# Patient Record
Sex: Female | Born: 1960 | Race: White | Hispanic: No | Marital: Married | Smoking: Former smoker
Health system: Southern US, Community
[De-identification: ages and names within clinical notes are randomized; demographics above are authoritative.]

## PROBLEM LIST (undated history)

## (undated) DIAGNOSIS — M79606 Pain in leg, unspecified: Secondary | ICD-10-CM

## (undated) DIAGNOSIS — M79646 Pain in unspecified finger(s): Secondary | ICD-10-CM

## (undated) DIAGNOSIS — F32A Depression, unspecified: Secondary | ICD-10-CM

## (undated) DIAGNOSIS — E781 Pure hyperglyceridemia: Secondary | ICD-10-CM

## (undated) DIAGNOSIS — M79643 Pain in unspecified hand: Secondary | ICD-10-CM

## (undated) DIAGNOSIS — F419 Anxiety disorder, unspecified: Secondary | ICD-10-CM

## (undated) DIAGNOSIS — E119 Type 2 diabetes mellitus without complications: Secondary | ICD-10-CM

## (undated) DIAGNOSIS — I10 Essential (primary) hypertension: Secondary | ICD-10-CM

## (undated) DIAGNOSIS — M1990 Unspecified osteoarthritis, unspecified site: Secondary | ICD-10-CM

## (undated) DIAGNOSIS — M549 Dorsalgia, unspecified: Secondary | ICD-10-CM

## (undated) DIAGNOSIS — J45909 Unspecified asthma, uncomplicated: Secondary | ICD-10-CM

## (undated) HISTORY — PX: NOVASURE ABLATION: SHX5394

## (undated) HISTORY — DX: Unspecified asthma, uncomplicated: J45.909

## (undated) HISTORY — DX: Type 2 diabetes mellitus without complications: E11.9

## (undated) HISTORY — DX: Dorsalgia, unspecified: M54.9

## (undated) HISTORY — DX: Pain in unspecified hand: M79.643

## (undated) HISTORY — PX: TONSILLECTOMY: SUR1361

## (undated) HISTORY — PX: TUBAL LIGATION: SHX77

## (undated) HISTORY — DX: Pure hyperglyceridemia: E78.1

## (undated) HISTORY — DX: Pain in leg, unspecified: M79.606

## (undated) HISTORY — DX: Pain in unspecified finger(s): M79.646

## (undated) HISTORY — DX: Unspecified osteoarthritis, unspecified site: M19.90

## (undated) HISTORY — DX: Anxiety disorder, unspecified: F41.9

## (undated) HISTORY — DX: Depression, unspecified: F32.A

---

## 1986-03-09 HISTORY — PX: OTHER SURGICAL HISTORY: SHX169

## 1999-12-23 ENCOUNTER — Encounter: Payer: Self-pay | Admitting: *Deleted

## 1999-12-23 ENCOUNTER — Encounter: Admission: RE | Admit: 1999-12-23 | Discharge: 1999-12-23 | Payer: Self-pay | Admitting: *Deleted

## 2000-07-26 ENCOUNTER — Ambulatory Visit (HOSPITAL_COMMUNITY): Admission: RE | Admit: 2000-07-26 | Discharge: 2000-07-26 | Payer: Self-pay | Admitting: *Deleted

## 2000-07-26 ENCOUNTER — Encounter: Payer: Self-pay | Admitting: *Deleted

## 2000-11-22 ENCOUNTER — Encounter: Payer: Self-pay | Admitting: *Deleted

## 2000-11-22 ENCOUNTER — Ambulatory Visit (HOSPITAL_COMMUNITY): Admission: RE | Admit: 2000-11-22 | Discharge: 2000-11-22 | Payer: Self-pay | Admitting: *Deleted

## 2001-07-27 ENCOUNTER — Other Ambulatory Visit: Admission: RE | Admit: 2001-07-27 | Discharge: 2001-07-27 | Payer: Self-pay | Admitting: Obstetrics and Gynecology

## 2001-08-03 ENCOUNTER — Encounter: Payer: Self-pay | Admitting: Obstetrics and Gynecology

## 2001-08-03 ENCOUNTER — Ambulatory Visit (HOSPITAL_COMMUNITY): Admission: RE | Admit: 2001-08-03 | Discharge: 2001-08-03 | Payer: Self-pay | Admitting: Obstetrics and Gynecology

## 2001-11-09 ENCOUNTER — Emergency Department (HOSPITAL_COMMUNITY): Admission: EM | Admit: 2001-11-09 | Discharge: 2001-11-09 | Payer: Self-pay | Admitting: Emergency Medicine

## 2001-11-09 ENCOUNTER — Encounter: Payer: Self-pay | Admitting: Emergency Medicine

## 2001-11-14 ENCOUNTER — Encounter: Payer: Self-pay | Admitting: Internal Medicine

## 2001-11-14 ENCOUNTER — Ambulatory Visit (HOSPITAL_COMMUNITY): Admission: RE | Admit: 2001-11-14 | Discharge: 2001-11-14 | Payer: Self-pay | Admitting: Internal Medicine

## 2001-11-21 ENCOUNTER — Ambulatory Visit (HOSPITAL_COMMUNITY): Admission: RE | Admit: 2001-11-21 | Discharge: 2001-11-21 | Payer: Self-pay | Admitting: Internal Medicine

## 2001-11-21 ENCOUNTER — Encounter: Payer: Self-pay | Admitting: Internal Medicine

## 2002-09-13 ENCOUNTER — Ambulatory Visit (HOSPITAL_COMMUNITY): Admission: RE | Admit: 2002-09-13 | Discharge: 2002-09-13 | Payer: Self-pay | Admitting: Internal Medicine

## 2002-09-13 ENCOUNTER — Encounter: Payer: Self-pay | Admitting: Internal Medicine

## 2003-09-17 ENCOUNTER — Ambulatory Visit (HOSPITAL_COMMUNITY): Admission: RE | Admit: 2003-09-17 | Discharge: 2003-09-17 | Payer: Self-pay | Admitting: Internal Medicine

## 2004-11-24 ENCOUNTER — Ambulatory Visit (HOSPITAL_COMMUNITY): Admission: RE | Admit: 2004-11-24 | Discharge: 2004-11-24 | Payer: Self-pay | Admitting: Internal Medicine

## 2005-03-09 HISTORY — PX: ABLATION: SHX5711

## 2005-05-13 ENCOUNTER — Encounter: Payer: Self-pay | Admitting: Obstetrics & Gynecology

## 2005-05-13 ENCOUNTER — Ambulatory Visit (HOSPITAL_COMMUNITY): Admission: RE | Admit: 2005-05-13 | Discharge: 2005-05-13 | Payer: Self-pay | Admitting: Obstetrics & Gynecology

## 2005-12-21 ENCOUNTER — Ambulatory Visit (HOSPITAL_COMMUNITY): Admission: RE | Admit: 2005-12-21 | Discharge: 2005-12-21 | Payer: Self-pay | Admitting: Obstetrics & Gynecology

## 2009-01-28 ENCOUNTER — Ambulatory Visit (HOSPITAL_COMMUNITY): Admission: RE | Admit: 2009-01-28 | Discharge: 2009-01-28 | Payer: Self-pay | Admitting: Internal Medicine

## 2010-07-25 NOTE — Op Note (Signed)
NAMESTARLING, Bridget Johnston                ACCOUNT NO.:  [id number]   MEDICAL RECORD NO.:  [id number]          PATIENT TYPE:  AMB   LOCATION:  DAY                           FACILITY:  [facility]   PHYSICIAN:  Lazaro Arms, M.D.   DATE OF BIRTH:  Apr 12, 1960   DATE OF PROCEDURE:  05/13/2005  DATE OF DISCHARGE:                                 OPERATIVE REPORT   PREOPERATIVE DIAGNOSES:  1.  Menometrorrhagia.  2.  Dysmenorrhea.   POSTOPERATIVE DIAGNOSES:  1.  Menometrorrhagia.  2.  Dysmenorrhea.   PROCEDURE:  Hysteroscopy D&C, endometrial ablation.   SURGEON:  Dr. Despina Hidden.   ANESTHESIA:  Laryngeal mask airway.   FINDINGS:  The patient had a normal endometrial cavity. No polyps. No  fibroid. No abnormalities.   DESCRIPTION OF OPERATION:  The patient was taken to the operating room,  placed in supine position, underwent laryngeal mask airway, placed in dorsal  lithotomy position, prepped and draped in usual sterile fashion. Sterile  speculum was placed. Cervix was grasped, dilated serially to allow passage  of the hysteroscope. Saline was used as a descending medium. The endometrial  cavity was completely normal. Tubal ostia were seen. There was no polyps.  Endometrial tissue looked normal, and there was no submucosal myomas. The  endometrium was then vigorously curetted. Good uterine cry obtained in all  areas. A ThermaChoice III endometrial ablation balloon was then used,  required 36 cc of D5W to maintain a pressure of approximately 195 mmHg  throughout the case. It was heated to 87 degrees Celsius for a total therapy  time of 10 minutes and 51 seconds. All the fluid was returned at the end of  the procedure. The patient was awakened from anesthesia and taken to  recovery room in good and stable condition. All counts were correct. She  received Ancef and Toradol preoperatively.      Lazaro Arms, M.D.  Electronically Signed     LHE/MEDQ  D:  05/13/2005  T:  05/14/2005  Job:  [id number]

## 2010-08-25 ENCOUNTER — Encounter: Payer: Self-pay | Admitting: Family Medicine

## 2010-08-25 ENCOUNTER — Inpatient Hospital Stay (INDEPENDENT_AMBULATORY_CARE_PROVIDER_SITE_OTHER)
Admission: RE | Admit: 2010-08-25 | Discharge: 2010-08-25 | Disposition: A | Discharge status: Home / Self Care | Payer: Managed Care, Other (non HMO) | Source: Ambulatory Visit | Attending: Family Medicine | Admitting: Family Medicine

## 2010-08-25 DIAGNOSIS — N390 Urinary tract infection, site not specified: Secondary | ICD-10-CM

## 2010-08-25 DIAGNOSIS — IMO0001 Reserved for inherently not codable concepts without codable children: Secondary | ICD-10-CM

## 2010-08-25 DIAGNOSIS — R29818 Other symptoms and signs involving the nervous system: Secondary | ICD-10-CM

## 2010-08-25 DIAGNOSIS — R509 Fever, unspecified: Secondary | ICD-10-CM

## 2010-08-25 LAB — CONVERTED CEMR LAB
Nitrite: NEGATIVE
Urobilinogen, UA: 0.2

## 2010-08-27 ENCOUNTER — Telehealth (INDEPENDENT_AMBULATORY_CARE_PROVIDER_SITE_OTHER): Payer: Self-pay | Admitting: *Deleted

## 2010-08-29 ENCOUNTER — Telehealth (INDEPENDENT_AMBULATORY_CARE_PROVIDER_SITE_OTHER): Payer: Self-pay | Admitting: *Deleted

## 2010-10-08 ENCOUNTER — Encounter: Payer: Self-pay | Admitting: Emergency Medicine

## 2010-10-08 ENCOUNTER — Inpatient Hospital Stay (INDEPENDENT_AMBULATORY_CARE_PROVIDER_SITE_OTHER)
Admission: RE | Admit: 2010-10-08 | Discharge: 2010-10-08 | Disposition: A | Discharge status: Home / Self Care | Payer: Managed Care, Other (non HMO) | Source: Ambulatory Visit | Attending: Emergency Medicine | Admitting: Emergency Medicine

## 2010-10-08 DIAGNOSIS — N10 Acute tubulo-interstitial nephritis: Secondary | ICD-10-CM

## 2010-10-08 DIAGNOSIS — Z87448 Personal history of other diseases of urinary system: Secondary | ICD-10-CM | POA: Insufficient documentation

## 2010-10-08 DIAGNOSIS — N390 Urinary tract infection, site not specified: Secondary | ICD-10-CM | POA: Insufficient documentation

## 2010-10-08 LAB — CONVERTED CEMR LAB
Bilirubin Urine: NEGATIVE
Glucose, Urine, Semiquant: NEGATIVE
Nitrite: NEGATIVE
Specific Gravity, Urine: >=1.03
Urobilinogen, UA: 0.2
pH: 5.5

## 2010-10-11 ENCOUNTER — Telehealth (INDEPENDENT_AMBULATORY_CARE_PROVIDER_SITE_OTHER): Payer: Self-pay | Admitting: Emergency Medicine

## 2011-02-09 NOTE — Telephone Encounter (Signed)
  Phone Note Outgoing Call   Call placed by: Lavell Islam RN,  October 11, 2010 10:08 AM Call placed to: Patient Action Taken: Phone Call Completed Summary of Call: Spoke with patient and gave her C&S results on urine spec; told her to complete the course of ABX prescribed. Initial call taken by: Lavell Islam RN,  October 11, 2010 10:08 AM

## 2011-02-09 NOTE — Progress Notes (Signed)
Summary: POSS UTI/TJ   Vital Signs:  Patient Profile:   50 Years Old Female CC:      possible UTI x 1 day Height:     63 inches Weight:      221.50 pounds O2 Sat:      95 % O2 treatment:    Room Air Temp:     98.3 degrees F oral Pulse rate:   112 / minute Resp:     18 per minute BP sitting:   113 / 69  (left arm) Cuff size:   regular  Vitals Entered By: Clemens Catholic LPN (October 08, 2010 8:27 AM)                  Updated Prior Medication List: LYRICA 100 MG CAPS (PREGABALIN)  CELEBREX 200 MG CAPS (CELECOXIB)  LOVAZA 1 GM CAPS (OMEGA-3-ACID ETHYL ESTERS)  CRESTOR 10 MG TABS (ROSUVASTATIN CALCIUM)  VIIBRYD 40 MG TABS (VILAZODONE HCL)  MOBIC 7.5 MG TABS (MELOXICAM) 1 by mouth q day  Current Allergies (reviewed today): No known allergies History of Present Illness History from: patient Chief Complaint: possible UTI x 1 day History of Present Illness: 50 yo F. Pt complains of 1 days of dysuria, frequency. + fever, max 103 last night with chills.  No hematuria. No abdominal pain. + L flank pain and L back pain. No nausea. No vomiting. Denies chance of pregnancy. LMP 2008. Had endometrial ablation 2008 for endometriosis 2008. Also c/o myalgias, mild headache.  Was seen here for UTI 08/26/10 for UTI. Documented E.Coli by urine cx, pansensisitive, effectively tx'd at that time by the prior treating MD. She denies hx of uti's prior to 08/2010.  REVIEW OF SYSTEMS Constitutional Symptoms       Complains of fever and chills.     Denies night sweats, weight loss, weight gain, and fatigue.  Eyes       Denies change in vision, eye pain, eye discharge, glasses, contact lenses, and eye surgery. Ear/Nose/Throat/Mouth       Denies hearing loss/aids, change in hearing, ear pain, ear discharge, dizziness, frequent runny nose, frequent nose bleeds, sinus problems, sore throat, hoarseness, and tooth pain or bleeding.  Respiratory       Denies dry cough, productive cough, wheezing,  shortness of breath, asthma, bronchitis, and emphysema/COPD.  Cardiovascular       Denies murmurs, chest pain, and tires easily with exhertion.    Gastrointestinal       Denies stomach pain, nausea/vomiting, diarrhea, constipation, blood in bowel movements, and indigestion. Genitourniary       Complains of painful urination.      Denies kidney stones and loss of urinary control. Neurological       Complains of headaches, numbness, and tingling.      Denies paralysis, seizures, and fainting/blackouts. Musculoskeletal       Complains of joint stiffness.      Denies muscle pain, joint pain, decreased range of motion, redness, swelling, muscle weakness, and gout.  Skin       Denies bruising, unusual mles/lumps or sores, and hair/skin or nail changes.  Psych       Denies mood changes, temper/anger issues, anxiety/stress, speech problems, depression, and sleep problems. Other Comments: pt c/o fever, joint pain, fever, and mid back pain x today. she has taken tylenol.   Past History:  Past Medical History: Reviewed history from 08/25/2010 and no changes required. Hyperlipidemia  Past Surgical History: Reviewed history from 08/25/2010 and no changes required.  Tonsillectomy ablasion vaginal deliveries  Family History: Reviewed history from 08/25/2010 and no changes required. mother- heart dz father- diabetes  Social History: Reviewed history from 08/25/2010 and no changes required. Never Smoked Alcohol use-no Drug use-no Physical Exam General appearance: well developed, well nourished,fatigued,  no acute distress Head: normocephalic, atraumatic Eyes: conjunctivae and lids normal Ears: normal, no lesions or deformities Nasal: mucosa pink, nonedematous, no septal deviation, turbinates normal Oral/Pharynx: tongue normal, posterior pharynx without erythema or exudate Neck: neck supple,  trachea midline, no masses Chest/Lungs: no rales, wheezes, or rhonchi bilateral, breath sounds  equal without effort Heart: regular rate and  rhythm, no murmur Abdomen: soft,  without obvious organomegaly. + suprapubic ttp. +L flank and L CVA ttp. No rebound tenderness. Extremities: normal extremities Neurological: grossly intact and non-focal Back: no tenderness over musculature, straight leg raises negative bilaterally, deep tendon reflexes 2+ at achilles and patella Skin: no obvious rashes or lesions. skin moist. 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 Clinically, has  PYELONEPHRITIS. Will rx aggressively as outpt.  Patient Education: Patient and/or caregiver instructed in the following: rest, fluids.  Plan New Medications/Changes: FLUCONAZOLE 200 MG TABS (FLUCONAZOLE) 1 by mouth stat as needed for yeast infection. May repeat X 1 three days later.  #2 x 0, 10/08/2010, Lajean Manes MD CIPRO 500 MG TABS (CIPROFLOXACIN HCL) 1po two times a day X 10 days  #20 x 0, 10/08/2010, Lajean Manes MD  New Orders: Est. Patient Level IV [40981] Rocephin 250mg  [CPT-J0696] T-Culture, Urine [[id number]] UA Dipstick w/Micro (automated) [81001] Admin of Therapeutic Inj  intramuscular or subcutaneous [96372] Planning Comments:   Rocephin 1 gram IM stat, then Cipro by mouth for 10 days. Urine cx. F/U pcp 5-7 days, sooner if worse or new sxs. I advised f/u with urologist, but she declined. Risks, benefits, alternatives discussed. Pt voiced understanding.   The patient and/or caregiver has been counseled thoroughly with regard to medications prescribed including dosage, schedule, interactions, rationale for use, and possible side effects and they verbalize understanding.  Diagnoses and expected course of recovery discussed and will return if not improved as expected or if the condition worsens. Patient and/or caregiver verbalized understanding.  Prescriptions: FLUCONAZOLE 200 MG TABS (FLUCONAZOLE) 1 by mouth stat as needed for  yeast infection. May repeat X 1 three days later.  #2 x 0   Entered and Authorized by:   Lajean Manes MD   Signed by:   Lajean Manes MD on 10/08/2010   Method used:   Print then Give to Patient   RxID:   [id number] CIPRO 500 MG TABS (CIPROFLOXACIN HCL) 1po two times a day X 10 days  #20 x 0   Entered and Authorized by:   Lajean Manes MD   Signed by:   Lajean Manes MD on 10/08/2010   Method used:   Print then Give to Patient   RxID:   [id number]   Medication Administration  Injection # 1:    Medication: Rocephin  250mg     Diagnosis: ACUT PYELONEPHRITIS W/O LES RENAL MEDULRY NECROS (ICD-590.10)    Route: IM    Site: RUOQ gluteus    Exp Date: 05/07/2013    Lot #: [id number]    Mfr: [email or web address]    Comments: 1 gram given    Patient tolerated injection without complications    Given by: Clemens Catholic LPN (October 08, 2010 9:23 AM)  Orders Added: 1)  Est. Patient Level IV [47425] 2)  Rocephin 250mg  [CPT-J0696] 3)  T-Culture, Urine [16109-60454] 4)  UA Dipstick w/Micro (automated) [81001] 5)  Admin of Therapeutic Inj  intramuscular or subcutaneous [96372]    Laboratory Results   Urine Tests  Date/Time Received: October 08, 2010 8:30 AM  Date/Time Reported: October 08, 2010 8:30 AM   Routine Urinalysis   Appearance: Cloudy Glucose: negative   (Normal Range: Negative) Bilirubin: negative   (Normal Range: Negative) Ketone: trace (5)   (Normal Range: Negative) Spec. Gravity: >=1.030   (Normal Range: 1.003-1.035) Blood: 1+   (Normal Range: Negative) pH: 5.5   (Normal Range: 5.0-8.0) Protein: 2+   (Normal Range: Negative) Urobilinogen: 0.2   (Normal Range: 0-1) Nitrite: negative   (Normal Range: Negative) Leukocyte Esterace: 1+   (Normal Range: Negative)

## 2011-02-09 NOTE — Telephone Encounter (Signed)
  Phone Note Call from Patient   Caller: Patient Call For: Labs Reason for Call: Talk to Nurse Summary of Call: Patient called requesting lab results. After contacting [facility] I received her Lymes and BMP results. Results given over the phone. Patient is still having the same fevers. Told her we would call once all lab results are in. Keep using tylenol or ibuprofen for fever, stay hydrated. Call with concerns. Initial call taken by: Lajean Saver RN,  August 27, 2010 3:10 PM

## 2011-02-09 NOTE — Telephone Encounter (Signed)
  Phone Note Outgoing Call   Call placed by: Clemens Catholic LPN,  August 29, 2010 6:34 PM Call placed to: Patient Summary of Call: call back: spoke to pt and given lab results. she states that she is still weak and she has developed more fever blisters. she does not have a fever anymore. advised her to complete abt and if no improve by Monday to see her PCP. pt agrees. to call back if she has any questions or concerns. Initial call taken by: Clemens Catholic LPN,  August 29, 2010 6:34 PM

## 2011-02-09 NOTE — Progress Notes (Signed)
Summary: FEVER/CHILLS/JOINT PAIN/HEADACHE rm 2   Vital Signs:  Patient Profile:   50 Years Old Female CC:      fever, chills, jt pain, HA, and dizzy x Saturday Height:     63 inches Weight:      223 pounds O2 Sat:      94 % O2 treatment:    Room Air Temp:     99.2 degrees F oral Pulse rate:   103 / minute Resp:     20 per minute BP sitting:   106 / 71  (left arm) Cuff size:   large  Vitals Entered By: Clemens Catholic LPN (August 25, 2010 5:28 PM)                  Prior Medication List:  No prior medications documented  Updated Prior Medication List: LYRICA 100 MG CAPS (PREGABALIN)  CELEBREX 200 MG CAPS (CELECOXIB)  LOVAZA 1 GM CAPS (OMEGA-3-ACID ETHYL ESTERS)  CRESTOR 10 MG TABS (ROSUVASTATIN CALCIUM)  VIIBRYD 40 MG TABS (VILAZODONE HCL)   Current Allergies: No known allergies History of Present Illness Chief Complaint: fever, chills, jt pain, HA, and dizzy x Saturday History of Present Illness: Patient report episodic high fevers w/myalgia,aches andchills since Saturday early AM. She denies sore throat cough but did notice a strange hue and smell to her urine.  Current Problems: URINARY TRACT INFECTION (ICD-599.0) MUSCULOSKELETAL PAIN (ICD-781.99) MYALGIA/MYOSITIS (ICD-729.1) FEVER (ICD-780.60) HYPERLIPIDEMIA (ICD-272.4)   Current Meds LYRICA 100 MG CAPS (PREGABALIN)  CELEBREX 200 MG CAPS (CELECOXIB)  LOVAZA 1 GM CAPS (OMEGA-3-ACID ETHYL ESTERS)  CRESTOR 10 MG TABS (ROSUVASTATIN CALCIUM)  VIIBRYD 40 MG TABS (VILAZODONE HCL)  DOXYCYCLINE HYCLATE 100 MG CAPS (DOXYCYCLINE HYCLATE) Take 1 tab twice a day MOBIC 7.5 MG TABS (MELOXICAM) 1 by mouth q day  REVIEW OF SYSTEMS Constitutional Symptoms       Complains of fever, chills, night sweats, and fatigue.     Denies weight loss and weight gain.  Eyes       Complains of change in vision and glasses.      Denies eye pain, eye discharge, contact lenses, and eye surgery. Ear/Nose/Throat/Mouth       Denies hearing  loss/aids, change in hearing, ear pain, ear discharge, dizziness, frequent runny nose, frequent nose bleeds, sinus problems, sore throat, hoarseness, and tooth pain or bleeding.  Respiratory       Denies dry cough, productive cough, wheezing, shortness of breath, asthma, bronchitis, and emphysema/COPD.  Cardiovascular       Denies murmurs, chest pain, and tires easily with exhertion.    Gastrointestinal       Denies stomach pain, nausea/vomiting, diarrhea, constipation, blood in bowel movements, and indigestion.      Comments: nausea Genitourniary       Denies painful urination, kidney stones, and loss of urinary control. Neurological       Complains of headaches and tremors.      Denies paralysis, seizures, and fainting/blackouts. Musculoskeletal       Complains of muscle pain, joint pain, and joint stiffness.      Denies decreased range of motion, redness, swelling, muscle weakness, and gout.  Skin       Denies bruising, unusual mles/lumps or sores, and hair/skin or nail changes.  Psych       Denies mood changes, temper/anger issues, anxiety/stress, speech problems, depression, and sleep problems. Other Comments: pt c/o fever, joint pain, chills, HA, and dizzy x Saturday @ 4AM. she states that her temp  was 104.6 this morning. she has take Tylenol and Motrin with no relief.    Past History:  Family History: Last updated: 08/25/2010 mother- heart dz father- diabetes  Social History: Last updated: 08/25/2010 Never Smoked Alcohol use-no Drug use-no  Past Medical History: Hyperlipidemia  Past Surgical History: Tonsillectomy ablasion vaginal deliveries  Family History: Reviewed history and no changes required. mother- heart dz father- diabetes  Social History: Reviewed history and no changes required. Never Smoked Alcohol use-no Drug use-no Smoking Status:  never Drug Use:  no Physical Exam General appearance: well developed, well nourished,anxious Head:  normocephalic, atraumatic Oral/Pharynx: tongue normal, posterior pharynx without erythema or exudate some redness over upper hard palate Neck: supple,anterior lymphadenopathy present Chest/Lungs: no rales, wheezes, or rhonchi bilateral, breath sounds equal without effort Heart: regular rate and  rhythm, no murmur Abdomen: soft, non-tender without obvious organomegaly Extremities: normal extremities Skin: no obvious rashes or lesions MSE: oriented to time, place, and person Assessment New Problems: URINARY TRACT INFECTION (ICD-599.0) MUSCULOSKELETAL PAIN (ICD-781.99) MYALGIA/MYOSITIS (ICD-729.1) FEVER (ICD-780.60) HYPERLIPIDEMIA (ICD-272.4)  myalgia and fever abnormal urine Pylo?  Plan New Medications/Changes: MOBIC 7.5 MG TABS (MELOXICAM) 1 by mouth q day  #30 x 1, 08/25/2010, Hassan Rowan MD DOXYCYCLINE HYCLATE 100 MG CAPS (DOXYCYCLINE HYCLATE) Take 1 tab twice a day  #20 x 0, 08/25/2010, Hassan Rowan MD  New Orders: New Patient Level IV [99204] Rocephin 250mg  [CPT-J0696] UA Dipstick W/ Micro (manual) [81000] CBC w/Diff [[id number]] T-Basic Metabolic Panel [80048-22910] T-Culture, Urine [09811-91478] T-Lyme Disease [29562-13086] T-Rocky Mtn Spotted Fever AB IgG IgM [57846-96295] Rocephin  250mg  [J0696] Admin of Therapeutic Inj  intramuscular or subcutaneous [28413] Planning Comments:   due to nature opf illness recommmend waiting until Wednesday or Thursday before going back to work  Follow Up: Follow up in 2-3 days if no improvement, Follow up on an as needed basis, Follow up with Primary Physician Work/School Excuse: Return to work/school in 2 days  The patient and/or caregiver has been counseled thoroughly with regard to medications prescribed including dosage, schedule, interactions, rationale for use, and possible side effects and they verbalize understanding.  Diagnoses and expected course of recovery discussed and will return if not improved as expected or if the  condition worsens. Patient and/or caregiver verbalized understanding.  Prescriptions: MOBIC 7.5 MG TABS (MELOXICAM) 1 by mouth q day  #30 x 1   Entered and Authorized by:   Hassan Rowan MD   Signed by:   Hassan Rowan MD on 08/25/2010   Method used:   Print then Give to Patient   RxID:   [id number] DOXYCYCLINE HYCLATE 100 MG CAPS (DOXYCYCLINE HYCLATE) Take 1 tab twice a day  #20 x 0   Entered and Authorized by:   Hassan Rowan MD   Signed by:   Hassan Rowan MD on 08/25/2010   Method used:   Print then Give to Patient   RxID:   [id number]   Patient Instructions: 1)  This could be early pylonephritis 2)  or tick fever 3)  will get RMSF, and lymes test  4)  1gram of Rocephin given here culture of urine pending 5)  Doxycycline 100mg  by mouth twice aday 6)  mobic for fever 7)  follow up here or w/PCP in 1 week  8)  return to work at 06/20-21/2012 9)  Please schedule a follow-up appointment as needed. 10)  Please schedule an appointment with your primary doctor in :7-10 days 11)  Drink as much fluid as you can tolerate for the  next few days. 12)  Oral Rehydration Solution: drink 1/2 ounce every 15 minutes. If tolerated afert 1 hour, drink 1 ounce every 15 minutes. As you can tolerate, keep adding 1/2 ounce every 15 minutes, up to a total of 2-4 ounces. Contact the office if unable to tolerate oral solution, if you keep vomiting, or you continue to have signs of dehydration. 13)  Take your antibiotic as prescribed until ALL of it is gone, but stop if you develop a rash or swelling and contact our office as soon as possible.  Medication Administration  Injection # 1:    Medication: Rocephin  250mg     Diagnosis: URINARY TRACT INFECTION (ICD-599.0)    Route: IM    Site: RUOQ gluteus    Exp Date: 07/07/2012    Lot #: [id number]    Mfr: [email or web address]    Comments: 1g given    Patient tolerated injection without complications    Given by: Clemens Catholic LPN (August 25, 2010 7:03 PM)  Orders  Added: 1)  New Patient Level IV [99204] 2)  Rocephin 250mg  [CPT-J0696] 3)  UA Dipstick W/ Micro (manual) [81000] 4)  CBC w/Diff [91478-29562] 5)  T-Basic Metabolic Panel [80048-22910] 6)  T-Culture, Urine [13086-57846] 7)  T-Lyme Disease [96295-28413] 8)  T-Rocky Mtn Spotted Fever AB IgG IgM [24401-02725] 9)  Rocephin  250mg  [J0696] 10)  Admin of Therapeutic Inj  intramuscular or subcutaneous [96372]      Laboratory Results   Urine Tests  Date/Time Received: August 25, 2010 7:03 PM  Date/Time Reported: August 25, 2010 7:03 PM   Routine Urinalysis   Color: dark yellow Appearance: Cloudy Glucose: negative   (Normal Range: Negative) Bilirubin: negative   (Normal Range: Negative) Ketone: negative   (Normal Range: Negative) Spec. Gravity: 1.020   (Normal Range: 1.003-1.035) Blood: trace-intact   (Normal Range: Negative) pH: 6.0   (Normal Range: 5.0-8.0) Protein: 1+   (Normal Range: Negative) Urobilinogen: 0.2   (Normal Range: 0-1) Nitrite: negative   (Normal Range: Negative) Leukocyte Esterace: 1+   (Normal Range: Negative)

## 2011-04-28 ENCOUNTER — Telehealth: Payer: Self-pay | Admitting: *Deleted

## 2011-04-28 ENCOUNTER — Ambulatory Visit (INDEPENDENT_AMBULATORY_CARE_PROVIDER_SITE_OTHER): Payer: Managed Care, Other (non HMO) | Admitting: Orthopedic Surgery

## 2011-04-28 ENCOUNTER — Encounter: Payer: Self-pay | Admitting: Orthopedic Surgery

## 2011-04-28 VITALS — Ht 63.0 in | Wt 228.0 lb

## 2011-04-28 DIAGNOSIS — M6530 Trigger finger, unspecified finger: Secondary | ICD-10-CM

## 2011-04-28 NOTE — Patient Instructions (Addendum)
You may have some pain the first few days   If so take XS tylenol 500 mg 2 tabs ev 8 hrs and apply ice

## 2011-04-28 NOTE — Progress Notes (Signed)
Patient ID: Bridget Johnston, female   DOB: 1960/07/18, 51 y.o.   MRN: [id number] Chief Complaint  Patient presents with  . Hand Problem    trigger finger both ring fingers, started Aug2012    51 yo female with h/o bilateral triggering of the ring fingers for several months with pain, locking and swelling.   No previous treatment  Review of Systems  Musculoskeletal:       See hpi   All other systems reviewed and are negative.    Past Medical History  Diagnosis Date  . High triglycerides   . Back pain   . Leg pain   . Hand pain   . Finger pain    History reviewed. No pertinent past surgical history.  History   Social History  . Marital Status: Married    Spouse Name: N/A    Number of Children: N/A  . Years of Education: college   Occupational History  . Not on file.   Social History Main Topics  . Smoking status: Never Smoker   . Smokeless tobacco: Not on file  . Alcohol Use: No  . Drug Use: No  . Sexually Active: Not on file   Other Topics Concern  . Not on file   Social History Narrative  . No narrative on file    Physical Exam(12) GENERAL: normal development   CDV: pulses are normal   Skin: normal  Lymph: nodes were not palpable/normal  Psychiatric: awake, alert and oriented  Neuro: normal sensation  Right and left ring fingers  * the A1 pulley is tender, the finger locks and triggers, no swelling of the digit but there is swelling of each hand. No atrophy or contracture, flexion power is normal   DX: trigger fingers   PLAN: bilateral injections

## 2011-04-28 NOTE — Telephone Encounter (Signed)
Opened in error

## 2011-04-28 NOTE — Progress Notes (Signed)
Procedure note trigger finger injection right ring   Diagnosis trigger finger Postop diagnosis trigger finger Procedure injection of trigger finger Finger injected RIGHT long finger and LEFT long finger  Details of procedure: After verbal consent and timeout to confirm site the RIGHT long finger was injected with 1 cc of 40 mg of Depo-Medrol and 1 cc of 1% lidocaine this was then repeated on the LEFT finger  The procedure was tolerated well without complication  Repeated for left ring finger

## 2011-05-07 ENCOUNTER — Telehealth: Payer: Self-pay | Admitting: Orthopedic Surgery

## 2011-05-07 NOTE — Telephone Encounter (Signed)
Advised Bridget Johnston of your reply.  She said you had told her at last visit  She could get up to 3 injections, and this was only her first . She was hoping to get  a second injection before any surgery is scheduled

## 2011-05-07 NOTE — Telephone Encounter (Signed)
rec surgical release if not improved   Schedule for preop if needed

## 2011-05-07 NOTE — Telephone Encounter (Signed)
Bridget Johnston received injections in bilateral ring fingers 04/28/11, she says both of them are still locking and hurting, pain may be just a little improved.  Asking when can she get another injection. Can leave a message at [phone] if no answer

## 2011-05-08 NOTE — Telephone Encounter (Signed)
Yes but its too soon to re-inject especially if it did not work

## 2013-12-22 LAB — HM MAMMOGRAPHY: HM MAMMO: NORMAL

## 2013-12-22 LAB — HM PAP SMEAR: HM PAP: NORMAL

## 2014-01-03 ENCOUNTER — Encounter: Payer: Self-pay | Admitting: Obstetrics & Gynecology

## 2014-01-03 ENCOUNTER — Ambulatory Visit (INDEPENDENT_AMBULATORY_CARE_PROVIDER_SITE_OTHER): Payer: Managed Care, Other (non HMO)

## 2014-01-03 ENCOUNTER — Ambulatory Visit (INDEPENDENT_AMBULATORY_CARE_PROVIDER_SITE_OTHER): Payer: Managed Care, Other (non HMO) | Admitting: Obstetrics & Gynecology

## 2014-01-03 VITALS — BP 131/77 | HR 92 | Resp 16 | Ht 63.0 in | Wt 232.0 lb

## 2014-01-03 DIAGNOSIS — R928 Other abnormal and inconclusive findings on diagnostic imaging of breast: Secondary | ICD-10-CM

## 2014-01-03 DIAGNOSIS — F1121 Opioid dependence, in remission: Secondary | ICD-10-CM

## 2014-01-03 DIAGNOSIS — F1120 Opioid dependence, uncomplicated: Secondary | ICD-10-CM | POA: Insufficient documentation

## 2014-01-03 DIAGNOSIS — Z124 Encounter for screening for malignant neoplasm of cervix: Secondary | ICD-10-CM

## 2014-01-03 DIAGNOSIS — J45909 Unspecified asthma, uncomplicated: Secondary | ICD-10-CM | POA: Insufficient documentation

## 2014-01-03 DIAGNOSIS — Z01419 Encounter for gynecological examination (general) (routine) without abnormal findings: Secondary | ICD-10-CM

## 2014-01-03 DIAGNOSIS — J4520 Mild intermittent asthma, uncomplicated: Secondary | ICD-10-CM

## 2014-01-03 DIAGNOSIS — Z1151 Encounter for screening for human papillomavirus (HPV): Secondary | ICD-10-CM

## 2014-01-03 NOTE — Progress Notes (Signed)
  Subjective:    Bridget Johnston is a 53 y.o. female who presents for an annual exam. The patient has no complaints of mild to moderate hot flashes.  She does not wish to take HRT  She will let us know if they become unbearable. The patient is sexually active.   She is experiencing some decrease in libido.  She is aware of the new drug coming on market but she is not interested in taking it.  She is married almost 30 years and is happy in her marriage.  GYN screening history: normal--has been several years The patient wears seatbelts: yes. The patient participates in regular exercise: no. Has the patient ever been transfused or tattooed?: not asked. The patient reports that there is not domestic violence in her life.   Menstrual History: OB History   Grav Para Term Preterm Abortions TAB SAB Ect Mult Living   2 2 2       2       No LMP recorded. Patient has had an ablation.    The following portions of the patient's history were reviewed and updated as appropriate: allergies, current medications, past family history, past medical history, past social history, past surgical history and problem list.  Review of Systems Pertinent items are noted in HPI.    Objective:      Filed Vitals:   01/03/14 0823  BP: 131/77  Pulse: 92  Resp: 16  Height: 5\' 3"  (1.6 m)  Weight: 232 lb (105.235 kg)   Vitals:  WNL General appearance: alert, cooperative and no distress Head: Normocephalic, without obvious abnormality, atraumatic Eyes: negative Throat: lips, mucosa, and tongue normal; teeth and gums normal Lungs: clear to auscultation bilaterally Breasts: normal appearance, no masses or tenderness, No nipple retraction or dimpling, No nipple discharge or bleeding Heart: regular rate and rhythm Abdomen: soft, non-tender; bowel sounds normal; no masses,  no organomegaly  Pelvic:  External Genitalia:  Tanner V, no lesion (pt is concerned about 3 small dark spots on her fourchette--reassured these are  normal hyperpigmented areas.  No reason for biopsy.  If they change then pt is to notify us.   Urethra:  No prolapse Vagina:  Pale pink, normal rugae, no blood or discharge; Moderate cystocele Cervix:  No CMT, no lesion Uterus:  Normal size and contour, non tender; mild prolapse of uterine corpus Adnexa:  Normal, no masses, non tender Rectovaginal Septum:  Non tender, no masses  Extremities: no edema, redness or tenderness in the calves or thighs Skin: no lesions or rash Lymph nodes: Axillary adenopathy: none     .    Assessment:    Healthy female exam.    Plan:     Mammogram. Thin prep Pap smear. with co testing Calcium (pt already takes vitamin D) RTC 12-18 months

## 2014-01-05 ENCOUNTER — Other Ambulatory Visit: Payer: Self-pay | Admitting: Obstetrics & Gynecology

## 2014-01-05 DIAGNOSIS — R928 Other abnormal and inconclusive findings on diagnostic imaging of breast: Secondary | ICD-10-CM

## 2014-01-05 LAB — CYTOLOGY - PAP

## 2014-01-08 ENCOUNTER — Encounter: Payer: Self-pay | Admitting: Obstetrics & Gynecology

## 2014-01-18 ENCOUNTER — Other Ambulatory Visit: Payer: Self-pay

## 2014-01-18 ENCOUNTER — Other Ambulatory Visit: Payer: Self-pay | Admitting: Obstetrics & Gynecology

## 2014-01-18 DIAGNOSIS — R928 Other abnormal and inconclusive findings on diagnostic imaging of breast: Secondary | ICD-10-CM

## 2014-01-22 ENCOUNTER — Ambulatory Visit
Admission: RE | Admit: 2014-01-22 | Discharge: 2014-01-22 | Disposition: A | Discharge status: Home / Self Care | Payer: Managed Care, Other (non HMO) | Source: Ambulatory Visit | Attending: Obstetrics & Gynecology | Admitting: Obstetrics & Gynecology

## 2014-01-22 DIAGNOSIS — R928 Other abnormal and inconclusive findings on diagnostic imaging of breast: Secondary | ICD-10-CM

## 2014-03-12 LAB — BASIC METABOLIC PANEL
CREATININE: 0.7 mg/dL (ref 0.5–1.1)
Glucose: 111 mg/dL
Potassium: 4.7 mmol/L (ref 3.4–5.3)

## 2014-03-12 LAB — HEPATIC FUNCTION PANEL
ALT: 57 U/L — AB (ref 7–35)
AST: 42 U/L — AB (ref 13–35)

## 2014-03-12 LAB — LIPID PANEL
CHOLESTEROL: 258 mg/dL — AB (ref 0–200)
HDL: 37 mg/dL (ref 35–70)
Triglycerides: 463 mg/dL — AB (ref 40–160)

## 2014-03-12 LAB — CBC AND DIFFERENTIAL
Hemoglobin: 13.7 g/dL (ref 12.0–16.0)
PLATELETS: 293 10*3/uL (ref 150–399)
WBC: 6.4 10*3/mL

## 2014-03-12 LAB — VITAMIN D 25 HYDROXY (VIT D DEFICIENCY, FRACTURES): Vit D, 25-Hydroxy: 27.9

## 2014-07-23 ENCOUNTER — Encounter: Payer: Self-pay | Admitting: Family Medicine

## 2014-07-23 ENCOUNTER — Ambulatory Visit (INDEPENDENT_AMBULATORY_CARE_PROVIDER_SITE_OTHER): Payer: Managed Care, Other (non HMO) | Admitting: Family Medicine

## 2014-07-23 VITALS — BP 124/84 | HR 87 | Ht 63.0 in | Wt 219.0 lb

## 2014-07-23 DIAGNOSIS — E785 Hyperlipidemia, unspecified: Secondary | ICD-10-CM

## 2014-07-23 DIAGNOSIS — R748 Abnormal levels of other serum enzymes: Secondary | ICD-10-CM | POA: Diagnosis not present

## 2014-07-23 DIAGNOSIS — R739 Hyperglycemia, unspecified: Secondary | ICD-10-CM | POA: Insufficient documentation

## 2014-07-23 DIAGNOSIS — F419 Anxiety disorder, unspecified: Secondary | ICD-10-CM

## 2014-07-23 DIAGNOSIS — E559 Vitamin D deficiency, unspecified: Secondary | ICD-10-CM

## 2014-07-23 DIAGNOSIS — F329 Major depressive disorder, single episode, unspecified: Secondary | ICD-10-CM

## 2014-07-23 DIAGNOSIS — F418 Other specified anxiety disorders: Secondary | ICD-10-CM

## 2014-07-23 DIAGNOSIS — M797 Fibromyalgia: Secondary | ICD-10-CM | POA: Insufficient documentation

## 2014-07-23 DIAGNOSIS — F32A Depression, unspecified: Secondary | ICD-10-CM

## 2014-07-23 MED ORDER — FUROSEMIDE 20 MG PO TABS: 20.0000 mg | ORAL_TABLET | Freq: Every day | ORAL | Status: DC | PRN

## 2014-07-23 MED ORDER — ALBUTEROL SULFATE HFA 108 (90 BASE) MCG/ACT IN AERS: 2.0000 | INHALATION_SPRAY | RESPIRATORY_TRACT | Status: DC | PRN

## 2014-07-23 MED ORDER — VILAZODONE HCL 40 MG PO TABS: 40.0000 mg | ORAL_TABLET | Freq: Every day | ORAL | Status: DC

## 2014-07-23 NOTE — Progress Notes (Signed)
CC: Bridget Johnston is a 54 y.o. female is here for Establish Care   Subjective: HPI:  Very pleasant 55 year old here to establish care  Reports a history of hyperlipidemia with cholesterol from early 2016 showing a triglyceride level in the 300s and total cholesterol of just slightly over 200. She is uncertain of exact values but remembers that LDL was not able to be calculated. She's never been on cholesterol-lowering medication. No formal exercise routine. Tries to watch what she eats with respect to fat and cholesterol. No cardiac disease or vascular disease that she is aware of.  History of hyperglycemia with a A1c that has always been in the prediabetic range. A little over a year ago she started on metformin 500 mg twice a day to be aggressive and proactive. She was able to lose 20 pounds over the last 6 months and completely stop this medication under the guidance of her former PCP. She checks her blood pressure a few times a week and the weekly average of random blood sugars are always around the 80s. She denies any hypoglycemic episodes.  Reports a history of vitamin D deficiency when checked in spring 2016. She's been taking a daily vitamin D supplement but unknown amount of units. She denies any recent or remote fatigue or any change in how she feels since starting this medication  Has a history of elevated liver enzymes when checked in early 2016. She is uncertain of the exact numbers but they were in the upper double digits certain it was not above 100. She's been taking acetaminophen on a daily basis for the week prior to this blood draw and denies any alcohol use. She never had any right upper quadrant pain or jaundice.  Reports a history of anxiety and depression has been present for the majority of her adult life. She's been on many SSRIs and tried SNRIs. 3 years ago she was started on Viibryd tells me this done wonders with respect to controlling her anxiety and depression. She's  also been on benzodiazepines in the past but she felt that her addictive personality was not suited for this medication. 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  Fibromyalgia: Diffuse muscle belly pain has been present for an unknown amount of time but was present on a daily basis until she started on Lyrica. She was unable to afford this medication and was switched to gabapentin at an unknown dose and frequency and had no change in efficacy, she takes this on a daily basis and has no return of her muscle belly pain. or skin sensitivity.  Review of Systems - General ROS: negative for - chills, fever, night sweats, weight gain or weight loss Ophthalmic ROS: negative for - decreased vision Psychological ROS: negative for - anxiety or depression ENT ROS: negative for - hearing change, nasal congestion, tinnitus or allergies Hematological and Lymphatic ROS: negative for - bleeding problems, bruising or swollen lymph nodes Breast ROS: negative Respiratory ROS: no cough, shortness of breath, or wheezing Cardiovascular ROS: no chest pain or dyspnea on exertion Gastrointestinal ROS: no abdominal pain, change in bowel habits, or black or bloody stools Genito-Urinary ROS: negative for - genital discharge, genital ulcers, incontinence or abnormal bleeding from genitals Musculoskeletal ROS: negative for - joint pain or muscle pain Neurological ROS: negative for - headaches or memory loss Dermatological ROS: negative for lumps, mole changes, rash  and skin lesion changes  Past Medical History  Diagnosis Date  . High triglycerides   . Back pain   . Leg pain   . Hand pain   . Finger pain   . Arthritis   . Asthma   . Diabetes mellitus without complication     Past Surgical History  Procedure Laterality Date  . Novasure ablation    . Tonsillectomy    . Btl  1988  . Ablation   2007  . Tubal ligation     Family History  Problem Relation Age of Onset  . Heart disease Father   . Arthritis    . Diabetes    . Diabetes Father   . Heart disease Mother   . Hypertension Mother   . Hypertension Father   . Cancer - Colon Maternal Grandfather     History   Social History  . Marital Status: Married    Spouse Name: N/A  . Number of Children: N/A  . 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 Smoking status: Former Smoker -- 1.00 packs/day for 17 years    Types: Cigarettes    Quit date: 01/06/2012  . Smokeless tobacco: Never Used  . Alcohol Use: No  . Drug Use: No     Comment: opiod adduction  . Sexual Activity:    Partners: Male    Pharmacist, hospitalBirth Control/ Protection: Surgical     Comment: BTL   Other Topics Concern  . Not on file   Social History Narrative     Objective: BP 124/84 mmHg  Pulse 87  Ht 5\' 3"  (1.6 m)  Wt 219 lb (99.338 kg)  BMI 38.80 kg/m2  General: Alert and Oriented, No Acute Distress HEENT: Pupils equal, round, reactive to light. Conjunctivae clear.  Moist mucous membranes pharynx unremarkable Lungs: Clear to auscultation bilaterally, no wheezing/ronchi/rales.  Comfortable work of breathing. Good air movement. Cardiac: Regular rate and rhythm. Normal S1/S2.  No murmurs, rubs, nor gallops.   Abdomen: Mild obesity Extremities: No peripheral edema.  Strong peripheral pulses.  Mental Status: No depression, anxiety, nor agitation. Skin: Warm and dry.  Assessment & Plan: Bridget FanningMona was seen today for establish care.  Diagnoses and all orders for this visit:  Hyperlipidemia Orders: -     COMPLETE METABOLIC PANEL WITH GFR -     Lipid panel  Hyperglycemia Orders: -     Hemoglobin A1c  Vitamin D deficiency  Elevated liver enzymes Orders: -     COMPLETE METABOLIC PANEL WITH GFR  Anxiety and depression Orders: -     Vilazodone HCl (VIIBRYD) 40 MG TABS; Take 1 tablet (40 mg  total) by mouth daily.  Fibromyalgia  Other orders -     furosemide (LASIX) 20 MG tablet; Take 1 tablet (20 mg total) by mouth daily as needed for fluid or edema. -     albuterol (PROAIR HFA) 108 (90 BASE) MCG/ACT inhaler; Inhale 2 puffs into the lungs every 4 (four) hours as needed for wheezing or shortness of breath.   hyperlipidemia: Due for repeat lipid panel after she began taking fish oil a few months ago Hyperglycemia: Fasting blood sugar and A1c Vitamin D deficiency: We'll readdress at future visits and states she is a symptomatic Although the liver enzymes: Repeat lipid panel now that she has stopped taking all acetaminophen products. Discussed that her elevated triglycerides were likely contributing to this. Anxiety and depression: Continue viibryd as  conditions appear to be controlled Fiber myalgia: Continue gabapentin and provide us with current gabapentin dosages for refills in the future Per her request Lasix refills provided that she takes only for edema, and on an as-needed basis. I informed her that she'll need to continue to get Suboxone from her current provider since I do not have licensing to write for this.  60 minutes spent face-to-face during visit today of which at least 50% was counseling or coordinating care regarding: 1. Hyperlipidemia   2. Hyperglycemia   3. Vitamin D deficiency   4. Elevated liver enzymes   5. Anxiety and depression   6. Fibromyalgia      Return in about 3 months (around 10/23/2014).

## 2014-07-30 LAB — HEMOGLOBIN A1C
Hgb A1c MFr Bld: 5.9 % — ABNORMAL HIGH (ref <5.7)
MEAN PLASMA GLUCOSE: 123 mg/dL — AB (ref <117)

## 2014-07-31 ENCOUNTER — Other Ambulatory Visit: Payer: Self-pay | Admitting: Family Medicine

## 2014-07-31 ENCOUNTER — Telehealth: Payer: Self-pay | Admitting: Family Medicine

## 2014-07-31 LAB — COMPLETE METABOLIC PANEL WITH GFR
ALT: 29 U/L (ref 0–35)
AST: 25 U/L (ref 0–37)
Albumin: 4.4 g/dL (ref 3.5–5.2)
Alkaline Phosphatase: 72 U/L (ref 39–117)
BUN: 8 mg/dL (ref 6–23)
CO2: 30 meq/L (ref 19–32)
Calcium: 10 mg/dL (ref 8.4–10.5)
Chloride: 102 mEq/L (ref 96–112)
Creat: 0.75 mg/dL (ref 0.50–1.10)
GFR, Est African American: >89 mL/min
GLUCOSE: 98 mg/dL (ref 70–99)
POTASSIUM: 5.2 meq/L (ref 3.5–5.3)
SODIUM: 141 meq/L (ref 135–145)
TOTAL PROTEIN: 7.1 g/dL (ref 6.0–8.3)
Total Bilirubin: 0.5 mg/dL (ref 0.2–1.2)

## 2014-07-31 LAB — LIPID PANEL
Cholesterol: 228 mg/dL — ABNORMAL HIGH (ref 0–200)
HDL: 42 mg/dL — AB (ref >=46)
LDL Cholesterol: 115 mg/dL — ABNORMAL HIGH (ref 0–99)
TRIGLYCERIDES: 356 mg/dL — AB (ref <150)
Total CHOL/HDL Ratio: 5.4 Ratio
VLDL: 71 mg/dL — ABNORMAL HIGH (ref 0–40)

## 2014-07-31 MED ORDER — GABAPENTIN 300 MG PO CAPS: 300.0000 mg | ORAL_CAPSULE | Freq: Three times a day (TID) | ORAL | Status: DC

## 2014-07-31 MED ORDER — FENOFIBRATE 145 MG PO TABS: 145.0000 mg | ORAL_TABLET | Freq: Every day | ORAL | Status: DC

## 2014-07-31 NOTE — Telephone Encounter (Signed)
Notified pt of results Bridget Johnston SMA

## 2014-07-31 NOTE — Telephone Encounter (Signed)
Bridget Johnston, Will you please let patient know that her liver enzymes and fasting blood sugar were normal. Her triglycerides were 356 with a goal of less than 150.  I'd recommend she add a medication called fenofibrate to her daily medication regimen to help lower this.  I've sent a Rx to [facility] in [place].  A1c was 5.9 which is in the prediabetic range but stable.  I'd recommend f/u in 3 months.

## 2014-07-31 NOTE — Telephone Encounter (Signed)
Bridget ChurchesKelsi,  Rx sent to [facility] in [place]

## 2014-07-31 NOTE — Telephone Encounter (Signed)
Pt called, states she is new to PCP Dr. Ivan AnchorsHommel and need refills on Rx's from her former pharmacy. Please advise if refills are appropriate. Thank you.

## 2014-10-30 ENCOUNTER — Encounter: Payer: Self-pay | Admitting: Family Medicine

## 2014-10-30 ENCOUNTER — Ambulatory Visit (INDEPENDENT_AMBULATORY_CARE_PROVIDER_SITE_OTHER): Payer: Managed Care, Other (non HMO) | Admitting: Family Medicine

## 2014-10-30 VITALS — BP 116/78 | HR 83 | Ht 63.0 in | Wt 225.0 lb

## 2014-10-30 DIAGNOSIS — F32A Depression, unspecified: Secondary | ICD-10-CM

## 2014-10-30 DIAGNOSIS — Z23 Encounter for immunization: Secondary | ICD-10-CM | POA: Diagnosis not present

## 2014-10-30 DIAGNOSIS — Z0000 Encounter for general adult medical examination without abnormal findings: Secondary | ICD-10-CM

## 2014-10-30 DIAGNOSIS — Z1211 Encounter for screening for malignant neoplasm of colon: Secondary | ICD-10-CM

## 2014-10-30 DIAGNOSIS — F418 Other specified anxiety disorders: Secondary | ICD-10-CM

## 2014-10-30 DIAGNOSIS — F329 Major depressive disorder, single episode, unspecified: Secondary | ICD-10-CM

## 2014-10-30 DIAGNOSIS — F419 Anxiety disorder, unspecified: Secondary | ICD-10-CM

## 2014-10-30 MED ORDER — OMEGA-3-ACID ETHYL ESTERS 1 G PO CAPS: 2.0000 g | ORAL_CAPSULE | Freq: Two times a day (BID) | ORAL | Status: DC

## 2014-10-30 MED ORDER — VILAZODONE HCL 40 MG PO TABS: 40.0000 mg | ORAL_TABLET | Freq: Every day | ORAL | Status: DC

## 2014-10-30 MED ORDER — TETANUS-DIPHTH-ACELL PERTUSSIS 5-2.5-18.5 LF-MCG/0.5 IM SUSP
0.5000 mL | Freq: Once | INTRAMUSCULAR | Status: AC
  Administered 2014-10-30: 0.5 mL via INTRAMUSCULAR

## 2014-10-30 MED ORDER — FUROSEMIDE 20 MG PO TABS: 20.0000 mg | ORAL_TABLET | Freq: Every day | ORAL | Status: DC | PRN

## 2014-10-30 MED ORDER — GABAPENTIN 300 MG PO CAPS: 300.0000 mg | ORAL_CAPSULE | Freq: Three times a day (TID) | ORAL | Status: DC

## 2014-10-30 NOTE — Progress Notes (Signed)
CC: Bridget Johnston is a 54 y.o. female is here for Annual Exam   Subjective: HPI:  Colonoscopy:Overdue, never performed, orders placed.  Papsmear: Normal 01/2014, repeat 2018 Mammogram: Due for annual screening in November 2016  Influenza Vaccine: Will receive at [facility] Pneumovax: no indication Td/Tdap: overdue, will receive Tdap today Zoster: No current indication  Presenting for a complete physical exam with no acute complaints other than reluctance to start on fenofibrate due to fear of hepatitis. She would rather focus on dietary measures to reduce triglycerides.  She is requesting refills on all of her medications  Review of Systems - General ROS: negative for - chills, fever, night sweats, weight gain or weight loss Ophthalmic ROS: negative for - decreased vision Psychological ROS: negative for - anxiety or depression ENT ROS: negative for - hearing change, nasal congestion, tinnitus or allergies Hematological and Lymphatic ROS: negative for - bleeding problems, bruising or swollen lymph nodes Breast ROS: negative Respiratory ROS: no cough, shortness of breath, or wheezing Cardiovascular ROS: no chest pain or dyspnea on exertion Gastrointestinal ROS: no abdominal pain, change in bowel habits, or black or bloody stools Genito-Urinary ROS: negative for - genital discharge, genital ulcers, incontinence or abnormal bleeding from genitals Musculoskeletal ROS: negative for - joint pain or muscle pain Neurological ROS: negative for - headaches or memory loss Dermatological ROS: negative for lumps, mole changes, rash and skin lesion changes  Past Medical History  Diagnosis Date  . High triglycerides   . Back pain   . Leg pain   . Hand pain   . Finger pain   . Arthritis   . Asthma   . Diabetes mellitus without complication     Past Surgical History  Procedure Laterality Date  . Novasure ablation    . Tonsillectomy    . Btl  1988  . Ablation  2007  . Tubal ligation      Family History  Problem Relation Age of Onset  . Heart disease Father   . Arthritis    . Diabetes    . Diabetes Father   . Heart disease Mother   . Hypertension Mother   . Hypertension Father   . Cancer - Colon Maternal Grandfather     Social History   Social History  . Marital Status: Married    Spouse Name: N/A  . Number of Children: N/A  . 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 Smoking status: Former Smoker -- 1.00 packs/day for 17 years    Types: Cigarettes    Quit date: 01/06/2012  . Smokeless tobacco: Never Used  . Alcohol Use: No  . Drug Use: No     Comment: opiod adduction  . Sexual Activity:    Partners: Male    Pharmacist, hospital Protection: Surgical     Comment: BTL   Other Topics Concern  . Not on file   Social History Narrative     Objective: BP 116/78 mmHg  Pulse 83  Ht  (1.6 m)  Wt 225 lb (102.059 kg)  BMI 39.87 kg/m2  SpO2 94%  General: No Acute Distress HEENT: Atraumatic, normocephalic, conjunctivae normal without scleral icterus.  No nasal discharge, hearing grossly intact, TMs with good landmarks bilaterally with no middle ear abnormalities, posterior pharynx clear without oral lesions. Neck: Supple, trachea midline, no cervical nor supraclavicular adenopathy. Pulmonary: Clear to auscultation bilaterally without wheezing, rhonchi, nor rales. Cardiac: Regular rate  and rhythm.  No murmurs, rubs, nor gallops. No peripheral edema.  2+ peripheral pulses bilaterally. Abdomen: Bowel sounds normal.  No masses.  Non-tender without rebound.  Negative Murphy's sign. MSK: Grossly intact, no signs of weakness.  Full strength throughout upper and lower extremities.  Full ROM in upper and lower extremities.  No midline spinal tenderness. Neuro: Gait unremarkable, CN II-XII grossly intact.  C5-C6 Reflex 2/4 Bilaterally, L4 Reflex 2/4 Bilaterally.  Cerebellar function intact. Skin: No  rashes. Psych: Alert and oriented to person/place/time.  Thought process normal. No anxiety/depression.   Assessment & Plan: Jarae was seen today for annual exam.  Diagnoses and all orders for this visit:  Annual physical exam -     BASIC METABOLIC PANEL WITH GFR -     Hemoglobin A1c -     Lipid panel -     Tdap (BOOSTRIX) injection 0.5 mL; Inject 0.5 mLs into the muscle once.  Colon cancer screening -     Ambulatory referral to Gastroenterology  Anxiety and depression -     Vilazodone HCl (VIIBRYD) 40 MG TABS; Take 1 tablet (40 mg total) by mouth daily.  Need for Tdap vaccination -     Tdap (BOOSTRIX) injection 0.5 mL; Inject 0.5 mLs into the muscle once.  Other orders -     furosemide (LASIX) 20 MG tablet; Take 1 tablet (20 mg total) by mouth daily as needed for fluid or edema. -     gabapentin (NEURONTIN) 300 MG capsule; Take 1 capsule (300 mg total) by mouth 3 (three) times daily. -     omega-3 acid ethyl esters (LOVAZA) 1 G capsule; Take 2 capsules (2 g total) by mouth 2 (two) times daily.   Healthy lifestyle interventions including but not limited to regular exercise, a healthy low fat diet, moderation of salt intake, the dangers of tobacco/alcohol/recreational drug use, nutrition supplementation, and accident avoidance were discussed with the patient and a handout was provided for future reference.  Return in about 3 months (around 01/30/2015).

## 2014-10-31 ENCOUNTER — Other Ambulatory Visit: Payer: Self-pay

## 2014-10-31 MED ORDER — ALBUTEROL SULFATE HFA 108 (90 BASE) MCG/ACT IN AERS: 2.0000 | INHALATION_SPRAY | RESPIRATORY_TRACT | Status: DC | PRN

## 2014-12-18 LAB — LIPID PANEL
CHOLESTEROL: 228 mg/dL — AB (ref 125–200)
HDL: 46 mg/dL (ref >=46)
LDL Cholesterol: 143 mg/dL — ABNORMAL HIGH (ref <130)
Total CHOL/HDL Ratio: 5 Ratio (ref <=5.0)
Triglycerides: 193 mg/dL — ABNORMAL HIGH (ref <150)
VLDL: 39 mg/dL — AB (ref <30)

## 2014-12-18 LAB — BASIC METABOLIC PANEL WITH GFR
BUN: 14 mg/dL (ref 7–25)
CALCIUM: 9.6 mg/dL (ref 8.6–10.4)
CO2: 32 mmol/L — ABNORMAL HIGH (ref 20–31)
Chloride: 100 mmol/L (ref 98–110)
Creat: 0.78 mg/dL (ref 0.50–1.05)
GFR, Est Non African American: 86 mL/min (ref >=60)
GLUCOSE: 103 mg/dL — AB (ref 65–99)
Potassium: 4.8 mmol/L (ref 3.5–5.3)
Sodium: 141 mmol/L (ref 135–146)

## 2014-12-18 LAB — HEMOGLOBIN A1C
Hgb A1c MFr Bld: 5.9 % — ABNORMAL HIGH (ref <5.7)
Mean Plasma Glucose: 123 mg/dL — ABNORMAL HIGH (ref <117)

## 2014-12-20 ENCOUNTER — Encounter: Payer: Self-pay | Admitting: Family Medicine

## 2014-12-21 ENCOUNTER — Encounter: Payer: Self-pay | Admitting: Family Medicine

## 2014-12-25 MED ORDER — GABAPENTIN 300 MG PO CAPS: 300.0000 mg | ORAL_CAPSULE | Freq: Three times a day (TID) | ORAL | Status: DC

## 2015-01-07 ENCOUNTER — Encounter: Payer: Self-pay | Admitting: Family Medicine

## 2015-01-07 MED ORDER — ALBUTEROL SULFATE HFA 108 (90 BASE) MCG/ACT IN AERS: 2.0000 | INHALATION_SPRAY | RESPIRATORY_TRACT | Status: DC | PRN

## 2015-03-04 ENCOUNTER — Encounter: Payer: Self-pay | Admitting: Family Medicine

## 2015-03-05 ENCOUNTER — Other Ambulatory Visit: Payer: Self-pay

## 2015-03-05 MED ORDER — ALBUTEROL SULFATE HFA 108 (90 BASE) MCG/ACT IN AERS: 2.0000 | INHALATION_SPRAY | RESPIRATORY_TRACT | Status: DC | PRN

## 2015-04-29 ENCOUNTER — Other Ambulatory Visit: Payer: Self-pay

## 2015-04-29 MED ORDER — OMEGA-3-ACID ETHYL ESTERS 1 G PO CAPS: 2.0000 g | ORAL_CAPSULE | Freq: Two times a day (BID) | ORAL | Status: DC

## 2015-04-30 ENCOUNTER — Telehealth: Payer: Self-pay | Admitting: *Deleted

## 2015-04-30 NOTE — Telephone Encounter (Signed)
Started to initiate a PA and BIN # pulls a [employer] form. Pt's scanned insurance card is [place]. Pt is due for a f/u appt. Called and notified patient ot schedule an appointment when fasting and bring in insurance cards.

## 2015-05-02 ENCOUNTER — Encounter: Payer: Self-pay | Admitting: Family Medicine

## 2015-05-02 ENCOUNTER — Ambulatory Visit (INDEPENDENT_AMBULATORY_CARE_PROVIDER_SITE_OTHER): Payer: Managed Care, Other (non HMO) | Admitting: Family Medicine

## 2015-05-02 VITALS — BP 121/78 | HR 94 | Wt 228.0 lb

## 2015-05-02 DIAGNOSIS — F418 Other specified anxiety disorders: Secondary | ICD-10-CM

## 2015-05-02 DIAGNOSIS — J4520 Mild intermittent asthma, uncomplicated: Secondary | ICD-10-CM

## 2015-05-02 DIAGNOSIS — F329 Major depressive disorder, single episode, unspecified: Secondary | ICD-10-CM

## 2015-05-02 DIAGNOSIS — R739 Hyperglycemia, unspecified: Secondary | ICD-10-CM

## 2015-05-02 DIAGNOSIS — F419 Anxiety disorder, unspecified: Secondary | ICD-10-CM

## 2015-05-02 DIAGNOSIS — M797 Fibromyalgia: Secondary | ICD-10-CM | POA: Diagnosis not present

## 2015-05-02 LAB — POCT GLYCOSYLATED HEMOGLOBIN (HGB A1C): HEMOGLOBIN A1C: 5.7

## 2015-05-02 MED ORDER — ALBUTEROL SULFATE HFA 108 (90 BASE) MCG/ACT IN AERS: 2.0000 | INHALATION_SPRAY | RESPIRATORY_TRACT | Status: DC | PRN

## 2015-05-02 MED ORDER — BUDESONIDE-FORMOTEROL FUMARATE 160-4.5 MCG/ACT IN AERO: 2.0000 | INHALATION_SPRAY | Freq: Two times a day (BID) | RESPIRATORY_TRACT | Status: DC

## 2015-05-02 MED ORDER — OMEGA-3-ACID ETHYL ESTERS 1 G PO CAPS: 2.0000 g | ORAL_CAPSULE | Freq: Two times a day (BID) | ORAL | Status: DC

## 2015-05-02 MED ORDER — FUROSEMIDE 20 MG PO TABS: 20.0000 mg | ORAL_TABLET | Freq: Every day | ORAL | Status: DC | PRN

## 2015-05-02 MED ORDER — VILAZODONE HCL 40 MG PO TABS: 40.0000 mg | ORAL_TABLET | Freq: Every day | ORAL | Status: DC

## 2015-05-02 MED ORDER — GABAPENTIN 300 MG PO CAPS: 300.0000 mg | ORAL_CAPSULE | Freq: Three times a day (TID) | ORAL | Status: DC

## 2015-05-02 NOTE — Progress Notes (Signed)
CC: Bridget Johnston is a 55 y.o. female is here for Medication Refill   Subjective: HPI:  Follow-up asthma: She is using albuterol on a daily basis but having no nocturnal symptoms. She tells me it helps for the majority of the day. Her symptoms include cough, wheezing and shortness of breath to a mild degree. Bridget Johnston have not been changed since I saw her last by mouth she denies fevers, chills or chest discomfort.  Follow-up anxiety: Continues to take viibryd on a daily basis without known side effects. She still believes it's doing tremendous job at keeping her anxiety and depression under control. Symptoms are not currently interfering with quality of life.  Follow-up hyperglycemia: She's been watching her carbohydrate intake. She checks her blood sugar about 3 times a week and it's always in the 90s when she checks it in the fasting state. Denies hypoglycemic episodes.  Follow-up fibromyalgia: She is taking gabapentin as prescribed with 100% compliance. Since I saw her last she denies any new fibromyalgia symptoms or any symptoms that interfere with quality of life. No weakness, muscle soreness or joint pain   Review Of Systems Outlined In HPI  Past Medical History  Diagnosis Date  . High triglycerides   . Back pain   . Leg pain   . Hand pain   . Finger pain   . Arthritis   . Asthma   . Diabetes mellitus without complication [facility])     Past Surgical History  Procedure Laterality Date  . Novasure ablation    . Tonsillectomy    . Btl  1988  . Ablation  2007  . Tubal ligation     Family History  Problem Relation Age of Onset  . Heart disease Father   . Arthritis    . Diabetes    . Diabetes Father   . Heart disease Mother   . Hypertension Mother   . Hypertension Father   . Cancer - Colon Maternal Grandfather     Social History   Social History  . Marital Status: Married    Spouse Name: N/A  . Number of Children: N/A  . 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 Smoking status: Former Smoker -- 1.00 packs/day for 17 years    Types: Cigarettes    Quit date: 01/06/2012  . Smokeless tobacco: Never Used  . Alcohol Use: No  . Drug Use: No     Comment: opiod adduction  . Sexual Activity:    Partners: Male    Pharmacist, hospital Protection: Surgical     Comment: BTL   Other Topics Concern  . Not on file   Social History Narrative     Objective: BP 121/78 mmHg  Pulse 94  Wt 228 lb (103.42 kg)  General: Alert and Oriented, No Acute Distress HEENT: Pupils equal, round, reactive to light. Conjunctivae clear.  External ears unremarkable, canals clear with intact TMs with appropriate landmarks.  Middle ear appears open without effusion. Pink inferior turbinates.  Moist mucous membranes, pharynx without inflammation nor lesions.  Neck supple without palpable lymphadenopathy nor abnormal masses. Lungs: Clear to auscultation bilaterally, no wheezing/ronchi/rales.  Comfortable work of breathing. Good air movement. Cardiac: Regular rate and rhythm. Normal S1/S2.  No murmurs, rubs, nor gallops.   Extremities: No peripheral edema.  Strong peripheral pulses.  Mental Status: No depression, anxiety, nor agitation. Skin: Warm and dry.  Assessment & Plan: Bridget Johnston was seen  today for medication refill.  Diagnoses and all orders for this visit:  Asthma, mild intermittent, uncomplicated  Anxiety and depression -     Vilazodone HCl (VIIBRYD) 40 MG TABS; Take 1 tablet (40 mg total) by mouth daily.  Hyperglycemia -     POCT HgB A1C  Fibromyalgia  Other orders -     albuterol (PROAIR HFA) 108 (90 Base) MCG/ACT inhaler; Inhale 2 puffs into the lungs every 4 (four) hours as needed for wheezing or shortness of breath. -     furosemide (LASIX) 20 MG tablet; Take 1 tablet (20 mg total) by mouth daily as needed for fluid or edema. -     omega-3 acid ethyl esters (LOVAZA) 1 g capsule; Take 2 capsules (2 g  total) by mouth 2 (two) times daily. -     gabapentin (NEURONTIN) 300 MG capsule; Take 1 capsule (300 mg total) by mouth 3 (three) times daily. -     Cancel: Buprenorphine HCl-Naloxone HCl (SUBOXONE) 8-2 MG FILM;  -     budesonide-formoterol (SYMBICORT) 160-4.5 MCG/ACT inhaler; Inhale 2 puffs into the lungs 2 (two) times daily.   Hyperglycemia: A1c of 5.7, controlled, continue diet and exercise interventions that are successful for the time being. Anxiety and depression controlled with viibryd Fibromyalgia currently controlled with gabapentin Asthma currently uncontrolled therefore she was given samples for Symbicort to take on a daily basis, 2 puffs twice a day.   Return in about 3 months (around 07/30/2015) for Asthma.

## 2015-05-09 ENCOUNTER — Encounter: Payer: Self-pay | Admitting: Family Medicine

## 2015-05-10 ENCOUNTER — Telehealth: Payer: Self-pay | Admitting: *Deleted

## 2015-05-10 NOTE — Telephone Encounter (Signed)
Prior auth initiated and approved for omega-3 and approved from 04-10-2015 through 05-09-2018. Case ID [id number]

## 2015-05-24 ENCOUNTER — Encounter: Payer: Self-pay | Admitting: Family Medicine

## 2015-05-24 MED ORDER — BUDESONIDE-FORMOTEROL FUMARATE 160-4.5 MCG/ACT IN AERO: 2.0000 | INHALATION_SPRAY | Freq: Two times a day (BID) | RESPIRATORY_TRACT | Status: DC

## 2015-05-27 ENCOUNTER — Encounter: Payer: Self-pay | Admitting: Family Medicine

## 2015-05-28 MED ORDER — BUDESONIDE-FORMOTEROL FUMARATE 160-4.5 MCG/ACT IN AERO: 2.0000 | INHALATION_SPRAY | Freq: Two times a day (BID) | RESPIRATORY_TRACT | Status: DC

## 2015-08-01 ENCOUNTER — Encounter: Payer: Self-pay | Admitting: Family Medicine

## 2015-08-01 ENCOUNTER — Ambulatory Visit (INDEPENDENT_AMBULATORY_CARE_PROVIDER_SITE_OTHER): Payer: Managed Care, Other (non HMO) | Admitting: Family Medicine

## 2015-08-01 VITALS — BP 97/61 | HR 99 | Resp 18 | Wt 233.9 lb

## 2015-08-01 DIAGNOSIS — R739 Hyperglycemia, unspecified: Secondary | ICD-10-CM

## 2015-08-01 DIAGNOSIS — F418 Other specified anxiety disorders: Secondary | ICD-10-CM | POA: Diagnosis not present

## 2015-08-01 DIAGNOSIS — F419 Anxiety disorder, unspecified: Secondary | ICD-10-CM

## 2015-08-01 DIAGNOSIS — J4520 Mild intermittent asthma, uncomplicated: Secondary | ICD-10-CM

## 2015-08-01 DIAGNOSIS — F329 Major depressive disorder, single episode, unspecified: Secondary | ICD-10-CM

## 2015-08-01 MED ORDER — FUROSEMIDE 20 MG PO TABS: 20.0000 mg | ORAL_TABLET | Freq: Every day | ORAL | Status: DC | PRN

## 2015-08-01 MED ORDER — GABAPENTIN 300 MG PO CAPS: 300.0000 mg | ORAL_CAPSULE | Freq: Three times a day (TID) | ORAL | Status: DC

## 2015-08-01 MED ORDER — BUDESONIDE-FORMOTEROL FUMARATE 160-4.5 MCG/ACT IN AERO: 2.0000 | INHALATION_SPRAY | Freq: Two times a day (BID) | RESPIRATORY_TRACT | Status: DC

## 2015-08-01 MED ORDER — VILAZODONE HCL 40 MG PO TABS: 40.0000 mg | ORAL_TABLET | Freq: Every day | ORAL | Status: DC

## 2015-08-01 NOTE — Progress Notes (Signed)
CC: Bridget Johnston is a 55 y.o. female is here for Follow-up   Subjective: HPI:  Follow-up asthma: Since starting on Symbicort she significantly reduce her need for albuterol. She tells me she uses it only a couple times a week. She denies any cough, wheezing or shortness of breath as long as she uses Symbicort twice a day on a daily basis. She denies any pulmonary complaints today.  Follow-up hyperglycemia: no outside blood sugars to report. Try to stay active. She is limiting her carbohydrate intake. She denies polyuria polydipsia or polydipsia.  Follow-up anxiety and depression. She still very happy with viibryd. She denies any anxiety or depression since I saw her last. She denies any known side effects. No thoughts of wanting to harm self or others   Review Of Systems Outlined In HPI  Past Medical History  Diagnosis Date  . High triglycerides   . Back pain   . Leg pain   . Hand pain   . Finger pain   . Arthritis   . Asthma   . Diabetes mellitus without complication [facility])     Past Surgical History  Procedure Laterality Date  . Novasure ablation    . Tonsillectomy    . Btl  1988  . Ablation  2007  . Tubal ligation     Family History  Problem Relation Age of Onset  . Heart disease Father   . Arthritis    . Diabetes    . Diabetes Father   . Heart disease Mother   . Hypertension Mother   . Hypertension Father   . Cancer - Colon Maternal Grandfather     Social History   Social History  . Marital Status: Married    Spouse Name: N/A  . Number of Children: N/A  . 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 Smoking status: Former Smoker -- 1.00 packs/day for 17 years    Types: Cigarettes    Quit date: 01/06/2012  . Smokeless tobacco: Never Used  . Alcohol Use: No  . Drug Use: No     Comment: opiod adduction  . Sexual Activity:    Partners: Male    Pharmacist, hospital Protection: Surgical     Comment:  BTL   Other Topics Concern  . Not on file   Social History Narrative     Objective: BP 97/61 mmHg  Pulse 99  Resp 18  Wt 233 lb 14.4 oz (106.096 kg)  Vital signs reviewed. General: Alert and Oriented, No Acute Distress HEENT: Pupils equal, round, reactive to light. Conjunctivae clear.  External ears unremarkable.  Moist mucous membranes. Lungs: Clear and comfortable work of breathing, speaking in full sentences without accessory muscle use. Trace end expiratory wheezing. Cardiac: Regular rate and rhythm.  Neuro: CN II-XII grossly intact, gait normal. Extremities: No peripheral edema.  Strong peripheral pulses.  Mental Status: No depression, anxiety, nor agitation. Logical though process. Skin: Warm and dry.  Assessment & Plan: Bridget Johnston was seen today for follow-up.  Diagnoses and all orders for this visit:  Asthma, mild intermittent, uncomplicated -     budesonide-formoterol (SYMBICORT) 160-4.5 MCG/ACT inhaler; Inhale 2 puffs into the lungs 2 (two) times daily.  Hyperglycemia -     POCT HgB A1C  Anxiety and depression -     Vilazodone HCl (VIIBRYD) 40 MG TABS; Take 1 tablet (40 mg total) by mouth daily.  Other orders -  gabapentin (NEURONTIN) 300 MG capsule; Take 1 capsule (300 mg total) by mouth 3 (three) times daily. -     furosemide (LASIX) 20 MG tablet; Take 1 tablet (20 mg total) by mouth daily as needed for fluid or edema.   Asthma: Controlled with Symbicort hyperglycemia: Currently controlled with a A1c of 5.7 Anxiety and depression: Controlled with viibryd   Return in about 3 months (around 11/01/2015) for Sugar.

## 2015-08-08 ENCOUNTER — Other Ambulatory Visit: Payer: Self-pay | Admitting: Family Medicine

## 2015-10-28 ENCOUNTER — Other Ambulatory Visit: Payer: Self-pay | Admitting: Family Medicine

## 2015-10-31 ENCOUNTER — Encounter: Payer: Self-pay | Admitting: Family Medicine

## 2015-10-31 ENCOUNTER — Ambulatory Visit (INDEPENDENT_AMBULATORY_CARE_PROVIDER_SITE_OTHER): Payer: Managed Care, Other (non HMO) | Admitting: Family Medicine

## 2015-10-31 VITALS — BP 138/84 | HR 87 | Wt 235.0 lb

## 2015-10-31 DIAGNOSIS — J4520 Mild intermittent asthma, uncomplicated: Secondary | ICD-10-CM | POA: Diagnosis not present

## 2015-10-31 DIAGNOSIS — F418 Other specified anxiety disorders: Secondary | ICD-10-CM

## 2015-10-31 DIAGNOSIS — R739 Hyperglycemia, unspecified: Secondary | ICD-10-CM | POA: Diagnosis not present

## 2015-10-31 DIAGNOSIS — F419 Anxiety disorder, unspecified: Secondary | ICD-10-CM

## 2015-10-31 DIAGNOSIS — F329 Major depressive disorder, single episode, unspecified: Secondary | ICD-10-CM

## 2015-10-31 LAB — POCT GLYCOSYLATED HEMOGLOBIN (HGB A1C): HEMOGLOBIN A1C: 5.8

## 2015-10-31 MED ORDER — VILAZODONE HCL 40 MG PO TABS: 40.0000 mg | ORAL_TABLET | Freq: Every day | ORAL | 5 refills | Status: DC

## 2015-10-31 MED ORDER — OMEGA-3-ACID ETHYL ESTERS 1 G PO CAPS: 2.0000 g | ORAL_CAPSULE | Freq: Two times a day (BID) | ORAL | 5 refills | Status: DC

## 2015-10-31 MED ORDER — GABAPENTIN 300 MG PO CAPS: 300.0000 mg | ORAL_CAPSULE | Freq: Three times a day (TID) | ORAL | 5 refills | Status: DC

## 2015-10-31 MED ORDER — FUROSEMIDE 20 MG PO TABS: 20.0000 mg | ORAL_TABLET | Freq: Every day | ORAL | 5 refills | Status: DC | PRN

## 2015-10-31 NOTE — Progress Notes (Signed)
CC: Bridget Johnston is a 55 y.o. female is here for Hyperglycemia   Subjective: HPI:  Follow-up hyperglycemia: No outside blood sugars to report. Since I saw her last she   has started habit eating ice cream every other day. She denies any other dietary changes. She tries to stay active taking care of her granddaughter. No formal exercise routine. Denies polyuria plication polydipsia  Follow-up anxiety and depression: She continues to produce the benefit from Viibryd. She denies any element of depression or anxiety or any other mental disturbance.  Follow-up asthma: She continues to use Symbicort on a daily basis without any recent coughs or shortness of breath or wheezing  Review Of Systems Outlined In HPI  Past Medical History:  Diagnosis Date  . Arthritis   . Asthma   . Back pain   . Diabetes mellitus without complication (HCC)   . Finger pain   . Hand pain   . High triglycerides   . Leg pain     Past Surgical History:  Procedure Laterality Date  . ABLATION  2007  . BTL  1988  . NOVASURE ABLATION    . TONSILLECTOMY    . TUBAL LIGATION     Family History  Problem Relation Age of Onset  . Heart disease Father   . Arthritis    . Diabetes    . Diabetes Father   . Heart disease Mother   . Hypertension Mother   . Hypertension Father   . Cancer - Colon Maternal Grandfather     Social History   Social History  . Marital status: Married    Spouse name: N/A  . Number of children: N/A  . Years of education: college   Occupational History  . hairdresser Other   Social History Main Topics  . Smoking status: Former Smoker    Packs/day: 1.00    Years: 17.00    Types: Cigarettes    Quit date: 01/06/2012  . Smokeless tobacco: Never Used  . Alcohol use No  . Drug use: No     Comment: opiod adduction  . Sexual activity: Yes    Partners: Male    Birth control/ protection: Surgical     Comment: BTL   Other Topics Concern  . Not on file   Social History Narrative   . No narrative on file     Objective: BP 138/84   Pulse 87   Wt 235 lb (106.6 kg)   BMI 41.63 kg/m   General: Alert and Oriented, No Acute Distress HEENT: Pupils equal, round, reactive to light. Conjunctivae clear. Moist mucous membranes Lungs: Clear to auscultation bilaterally, no wheezing/ronchi/rales.  Comfortable work of breathing. Good air movement. Cardiac: Regular rate and rhythm. Normal S1/S2.  No murmurs, rubs, nor gallops.   Extremities: No peripheral edema.  Strong peripheral pulses.  Mental Status: No depression, anxiety, nor agitation. Skin: Warm and dry.  Assessment & Plan: Bridget Johnston was seen today for hyperglycemia.  Diagnoses and all orders for this visit:  Hyperglycemia -     POCT HgB A1C  Anxiety and depression -     Vilazodone HCl (VIIBRYD) 40 MG TABS; Take 1 tablet (40 mg total) by mouth daily.  Asthma, mild intermittent, uncomplicated  Other orders -     Cancel: Buprenorphine HCl-Naloxone HCl (SUBOXONE) 8-2 MG FILM;  -     omega-3 acid ethyl esters (LOVAZA) 1 g capsule; Take 2 capsules (2 g total) by mouth 2 (two) times daily. -  gabapentin (NEURONTIN) 300 MG capsule; Take 1 capsule (300 mg total) by mouth 3 (three) times daily. -     furosemide (LASIX) 20 MG tablet; Take 1 tablet (20 mg total) by mouth daily as needed for fluid or edema.   Hyperuricemia: A1c of 5.8, controlled, continue diet and exercise interventions. Anxiety and depression: Controlled with Viibryd Asthma: Controlled with Symbicort  Discussed with this patient that I will be resigning from my position here with [facility] in September in order to stay with my [place]. I let him know about the providers that are still accepting patients and I feel that this individual will be under great care if he/she stays here with [facility].   Return in about 3 months (around 01/31/2016).

## 2015-11-27 ENCOUNTER — Other Ambulatory Visit: Payer: Self-pay | Admitting: Family Medicine

## 2015-12-11 ENCOUNTER — Encounter: Payer: Self-pay | Admitting: Family Medicine

## 2015-12-11 ENCOUNTER — Other Ambulatory Visit: Payer: Self-pay

## 2015-12-11 MED ORDER — ALBUTEROL SULFATE HFA 108 (90 BASE) MCG/ACT IN AERS: 2.0000 | INHALATION_SPRAY | RESPIRATORY_TRACT | 1 refills | Status: DC | PRN

## 2015-12-11 MED ORDER — ALBUTEROL SULFATE HFA 108 (90 BASE) MCG/ACT IN AERS: 2.0000 | INHALATION_SPRAY | RESPIRATORY_TRACT | 0 refills | Status: DC | PRN

## 2016-02-07 ENCOUNTER — Ambulatory Visit (INDEPENDENT_AMBULATORY_CARE_PROVIDER_SITE_OTHER): Payer: Managed Care, Other (non HMO) | Admitting: Physician Assistant

## 2016-02-07 ENCOUNTER — Encounter: Payer: Self-pay | Admitting: Physician Assistant

## 2016-02-07 VITALS — BP 145/71 | HR 93 | Ht 63.0 in | Wt 236.0 lb

## 2016-02-07 DIAGNOSIS — J4520 Mild intermittent asthma, uncomplicated: Secondary | ICD-10-CM

## 2016-02-07 DIAGNOSIS — E785 Hyperlipidemia, unspecified: Secondary | ICD-10-CM

## 2016-02-07 DIAGNOSIS — R8299 Other abnormal findings in urine: Secondary | ICD-10-CM

## 2016-02-07 DIAGNOSIS — R7303 Prediabetes: Secondary | ICD-10-CM

## 2016-02-07 DIAGNOSIS — J4521 Mild intermittent asthma with (acute) exacerbation: Secondary | ICD-10-CM | POA: Diagnosis not present

## 2016-02-07 DIAGNOSIS — R300 Dysuria: Secondary | ICD-10-CM

## 2016-02-07 DIAGNOSIS — Z1211 Encounter for screening for malignant neoplasm of colon: Secondary | ICD-10-CM

## 2016-02-07 DIAGNOSIS — R195 Other fecal abnormalities: Secondary | ICD-10-CM

## 2016-02-07 DIAGNOSIS — R82998 Other abnormal findings in urine: Secondary | ICD-10-CM

## 2016-02-07 LAB — POCT URINALYSIS DIPSTICK
BILIRUBIN UA: NEGATIVE
GLUCOSE UA: NEGATIVE
Ketones, UA: NEGATIVE
NITRITE UA: NEGATIVE
Protein, UA: NEGATIVE
RBC UA: NEGATIVE
Spec Grav, UA: 1.02
Urobilinogen, UA: 0.2
pH, UA: 6

## 2016-02-07 LAB — POCT GLYCOSYLATED HEMOGLOBIN (HGB A1C): Hemoglobin A1C: 5.7

## 2016-02-07 MED ORDER — SULFAMETHOXAZOLE-TRIMETHOPRIM 800-160 MG PO TABS: 1.0000 | ORAL_TABLET | Freq: Two times a day (BID) | ORAL | 0 refills | Status: DC

## 2016-02-07 MED ORDER — FLUCONAZOLE 150 MG PO TABS: 150.0000 mg | ORAL_TABLET | Freq: Once | ORAL | 0 refills | Status: AC

## 2016-02-07 MED ORDER — METHYLPREDNISOLONE SODIUM SUCC 125 MG IJ SOLR
125.0000 mg | Freq: Once | INTRAMUSCULAR | Status: AC
  Administered 2016-02-07: 125 mg via INTRAMUSCULAR

## 2016-02-07 MED ORDER — ALBUTEROL SULFATE HFA 108 (90 BASE) MCG/ACT IN AERS
2.0000 | INHALATION_SPRAY | RESPIRATORY_TRACT | 2 refills | Status: DC | PRN
Start: 2016-02-07 — End: 2016-03-22

## 2016-02-07 MED ORDER — BENZONATATE 200 MG PO CAPS: 200.0000 mg | ORAL_CAPSULE | Freq: Two times a day (BID) | ORAL | 0 refills | Status: DC | PRN

## 2016-02-07 NOTE — Progress Notes (Addendum)
Subjective:    Patient ID: Bridget Johnston, female    DOB: [birth year], 55 y.o.   MRN: [id number]  HPI Patient is a 55 yo female coming to the office today to recheck A1C. Patient's last A1C three months ago was 5.8%. Patient is taking OTC wheat grass to help with glucose control. Patient is not currently prescribed Metformin. Patient was previously on Metformin and said that it "bottomed out" her blood sugar levels. Patient also requests refills on her medications.   Patient also has complaints of a productive cough times two weeks. She has noticed herself becoming short of breath and wheezing within the last week. Over the last week, she has started using her Proair inhaler more frequently. She has been using 2 puffs of the Proair inhaler every 2-3 hours. She reports that she occasionally coughs up yellow/ green colored phlegm. She took two puffs of Proair and Symbicort this morning before coming to the office. She is positive for fatigue. She reports she has not had a fever. She has been around family members that have similar symptoms.   Patient has also been experiencing dysuria for a week. She also reports an increase in urinary frequency. She also reports left lower back pain  She denies hematuria.   Patient also requests to have a colonoscopy. She has not had one in the past. She reports having a change in bowel movements. She has been experiencing diarrhea on and off for a month. Patient denies abdominal pain and constipation.    Review of Systems  All other systems reviewed and are negative.  See HPI     Objective: Blood pressure (!) 145/71, pulse 93, height 5\' 3"  (1.6 m), weight 236 lb (107 kg), SpO2 95 %.   Physical Exam  Constitutional: She appears well-developed and well-nourished.  Cardiovascular: Normal rate and regular rhythm.  Exam reveals no gallop and no friction rub.   No murmur heard. Pulmonary/Chest: Effort normal and breath sounds normal. No respiratory distress. She  has no wheezes. She has no rales.  Musculoskeletal:  Mild left CVA tenderness     Hemoglobin A1C- 5.7%  UA-leukocytes present      Assessment & Plan:  Raynelle FanningMona was seen today for dysuria and cough.  Diagnoses and all orders for this visit:  1. Hyperglyceremia(prediabetes), unspecified hyperlipidemia type -     POCT HgB A1C- Resulted at 5.7%. Continue lifestyle modifications.   Mild intermittent asthma without complication -   Refilled albuterol (PROAIR HFA) 108 (90 Base) MCG/ACT inhaler; Inhale 2 puffs into the lungs every 4 (four) hours as needed for wheezing or shortness of breath.  Dysuria with leukocytes in urine  -     POCT urinalysis dipstick- leukocytes present  -     Urine Culture due to leukocytes being present in UA -  Start sulfamethoxazole-trimethoprim (BACTRIM DS,SEPTRA DS) 800-160 MG tablet; Take 1 tablet by mouth 2 (two) times daily for 10 days for possible UTI. Decided on Bactrim due to patient also showing symptoms of bacterial bronchitis.  -  Prescribed fluconazole (DIFLUCAN) 150 MG tablet; Take 1 tablet (150 mg total) by mouth once if patient starts experiencing symptoms of a yeast infection due to patient starting an antibiotic and is prone to yeast infections.  Mild intermittent asthmatic bronchitis with acute exacerbation -  Start benzonatate (TESSALON) 200 MG capsule; Take 1 capsule (200 mg total) by mouth 2 (two) times daily as needed for cough. -     methylPREDNISolone sodium succinate (SOLU-MEDROL) 125  mg/2 mL injection 125 mg; Inject 2 mLs (125 mg total) into the muscle once.  Change in stool/ colon cancer screening  -     Ambulatory referral to Gastroenterology to schedule appointment for colonoscopy.

## 2016-02-07 NOTE — Patient Instructions (Signed)

## 2016-02-09 LAB — URINE CULTURE: Organism ID, Bacteria: NO GROWTH

## 2016-03-22 ENCOUNTER — Other Ambulatory Visit: Payer: Self-pay | Admitting: Physician Assistant

## 2016-03-22 DIAGNOSIS — J4520 Mild intermittent asthma, uncomplicated: Secondary | ICD-10-CM

## 2016-04-06 LAB — HM COLONOSCOPY

## 2016-04-20 ENCOUNTER — Encounter: Payer: Self-pay | Admitting: Physician Assistant

## 2016-04-20 MED ORDER — GABAPENTIN 300 MG PO CAPS: 300.0000 mg | ORAL_CAPSULE | Freq: Three times a day (TID) | ORAL | 5 refills | Status: DC

## 2016-05-07 ENCOUNTER — Encounter: Payer: Self-pay | Admitting: Physician Assistant

## 2016-05-08 ENCOUNTER — Other Ambulatory Visit: Payer: Self-pay | Admitting: Family Medicine

## 2016-05-08 ENCOUNTER — Other Ambulatory Visit: Payer: Self-pay

## 2016-05-08 DIAGNOSIS — J4520 Mild intermittent asthma, uncomplicated: Secondary | ICD-10-CM

## 2016-05-08 MED ORDER — ALBUTEROL SULFATE HFA 108 (90 BASE) MCG/ACT IN AERS: INHALATION_SPRAY | RESPIRATORY_TRACT | 2 refills | Status: DC

## 2016-05-11 ENCOUNTER — Encounter: Payer: Self-pay | Admitting: Physician Assistant

## 2016-05-13 ENCOUNTER — Encounter: Payer: Self-pay | Admitting: Physician Assistant

## 2016-05-29 ENCOUNTER — Ambulatory Visit (INDEPENDENT_AMBULATORY_CARE_PROVIDER_SITE_OTHER): Payer: 59 | Admitting: Physician Assistant

## 2016-05-29 ENCOUNTER — Encounter: Payer: Self-pay | Admitting: Physician Assistant

## 2016-05-29 VITALS — BP 131/69 | HR 112 | Temp 99.1°F | Ht 63.0 in | Wt 237.0 lb

## 2016-05-29 DIAGNOSIS — R059 Cough, unspecified: Secondary | ICD-10-CM

## 2016-05-29 DIAGNOSIS — J069 Acute upper respiratory infection, unspecified: Secondary | ICD-10-CM | POA: Diagnosis not present

## 2016-05-29 DIAGNOSIS — R52 Pain, unspecified: Secondary | ICD-10-CM

## 2016-05-29 DIAGNOSIS — R509 Fever, unspecified: Secondary | ICD-10-CM

## 2016-05-29 DIAGNOSIS — R05 Cough: Secondary | ICD-10-CM

## 2016-05-29 DIAGNOSIS — R062 Wheezing: Secondary | ICD-10-CM | POA: Diagnosis not present

## 2016-05-29 DIAGNOSIS — J4521 Mild intermittent asthma with (acute) exacerbation: Secondary | ICD-10-CM | POA: Diagnosis not present

## 2016-05-29 LAB — POCT INFLUENZA A/B
INFLUENZA B, POC: NEGATIVE
Influenza A, POC: NEGATIVE

## 2016-05-29 MED ORDER — AZITHROMYCIN 250 MG PO TABS: ORAL_TABLET | ORAL | 0 refills | Status: DC

## 2016-05-29 MED ORDER — METHYLPREDNISOLONE ACETATE 80 MG/ML IJ SUSP
80.0000 mg | Freq: Once | INTRAMUSCULAR | Status: AC
  Administered 2016-05-29: 80 mg via INTRAMUSCULAR

## 2016-05-29 MED ORDER — PREDNISONE 50 MG PO TABS: ORAL_TABLET | ORAL | 0 refills | Status: DC

## 2016-05-29 MED ORDER — METHYLPREDNISOLONE SODIUM SUCC 125 MG IJ SOLR
125.0000 mg | Freq: Once | INTRAMUSCULAR | Status: AC
  Administered 2016-05-29: 125 mg via INTRAMUSCULAR

## 2016-05-29 MED ORDER — IPRATROPIUM-ALBUTEROL 0.5-2.5 (3) MG/3ML IN SOLN
3.0000 mL | Freq: Once | RESPIRATORY_TRACT | Status: AC
  Administered 2016-05-29: 3 mL via RESPIRATORY_TRACT

## 2016-05-29 NOTE — Patient Instructions (Signed)
Asthmatic Bronchitis Chronic bronchitis is a lasting inflammation of the bronchial tubes, which are the tubes that carry air into your lungs. This is inflammation that occurs:  On most days of the week.  For at least three months at a time.  Over a period of two years in a row. When the bronchial tubes are inflamed, they start to produce mucus. The inflammation and buildup of mucus make it more difficult to breathe. Chronic bronchitis is usually a permanent problem and is one type of chronic obstructive pulmonary disease (COPD). People with chronic bronchitis are at greater risk for getting repeated colds, or respiratory infections. What are the causes? Chronic bronchitis most often occurs in people who have:  Long-standing, severe asthma.  A history of smoking.  Asthma and who also smoke. What are the signs or symptoms? Chronic bronchitis may cause the following:  A cough that brings up mucus (productive cough).  Shortness of breath.  Early morning headache.  Wheezing.  Chest discomfort.  Recurring respiratory infections. How is this diagnosed? Your health care provider may confirm the diagnosis by:  Taking your medical history.  Performing a physical exam.  Taking a chest X-ray.  Performing pulmonary function tests. How is this treated? Treatment involves controlling symptoms with medicines, oxygen therapy, or making lifestyle changes, such as exercising and eating a healthy, well-balanced diet. Medicines could include:  Inhalers to improve air flow in and out of your lungs.  Antibiotics to treat bacterial infections, such as pneumonia, sinus infections, and acute bronchitis. As a preventative measure, your health care provider may recommend routine vaccinations for influenza and pneumonia. This is to prevent infection and hospitalization since you may be more at risk for these types of infections. Follow these instructions at home:  Take medicines only as directed  by your health care provider.  If you smoke cigarettes, chew tobacco, or use electronic cigarettes, quit. If you need help quitting, ask your health care provider.  Avoid pollen, dust, animal dander, molds, smoke, and other things that cause shortness of breath or wheezing attacks.  Talk to your health care provider about possible exercise routines. Regular exercise is very important to help you feel better.  If you are prescribed oxygen use at home follow these guidelines:  Never smoke while using oxygen. Oxygen does not burn or explode, but flammable materials will burn faster in the presence of oxygen.  Keep a Government social research officer close by. Let your fire department know that you have oxygen in your home.  Warn visitors not to smoke near you when you are using oxygen. Put up "no smoking" signs in your home where you most often use the oxygen.  Regularly test your smoke detectors at home to make sure they work. If you receive care in your home from a nurse or other health care provider, he or she may also check to make sure your smoke detectors work.  Ask your health care provider whether you would benefit from a pulmonary rehabilitation program.  Do not wait to get medical care if you have any concerning symptoms. Delays could cause permanent injury and may be life threatening. Contact a health care provider if:  You have increased coughing or shortness of breath or both.  You have muscle aches.  You have chest pain.  Your mucus gets thicker.  Your mucus changes from clear or white to yellow, green, gray, or bloody. Get help right away if:  Your usual medicines do not stop your wheezing.  You have  increased difficulty breathing.  You have any problems with the medicine you are taking, such as a rash, itching, swelling, or trouble breathing. This information is not intended to replace advice given to you by your health care provider. Make sure you discuss any questions you have  with your health care provider. Document Released: 12/11/2005 Document Revised: 07/04/2015 Document Reviewed: 04/03/2013 Elsevier Interactive Patient Education  2017 [employer]

## 2016-05-29 NOTE — Progress Notes (Addendum)
   Subjective:    Patient ID: Bridget AtesMona S Johnston, female    DOB: 1960-06-06, 56 y.o.   MRN: [id number]  HPI  Pt is a 56 yo female with a past medical history of asthma who presents to the clinic with 3 days of SOB, wheezing, cough, sinus pressure. She continues to feel some chest tightness that is persisent. She had a fever this morning of 102 per patient and took tylenol. No fever in office. She is using albuterol as needed with last dose 30 minutes ago. She uses symbicort every day twice a day.     Review of Systems  All other systems reviewed and are negative.      Objective:   Physical Exam  Constitutional: She is oriented to person, place, and time. She appears well-developed and well-nourished.  HENT:  Head: Normocephalic and atraumatic.  Right Ear: External ear normal.  Left Ear: External ear normal.  Nose: Nose normal.  Mouth/Throat: Oropharynx is clear and moist. No oropharyngeal exudate.  TM"s clear bilaterally.  Tenderness over maxillary sinus bilaterally.    Eyes: Conjunctivae are normal. Right eye exhibits no discharge. Left eye exhibits no discharge.  Neck: Normal range of motion. Neck supple.  Cardiovascular: Normal rate, regular rhythm and normal heart sounds.   Pulmonary/Chest:  Cough with deep breathing.  Bilateral wheezing. No rhonchi, crackles, rales.   Lymphadenopathy:    She has no cervical adenopathy.  Neurological: She is alert and oriented to person, place, and time.  Psychiatric: She has a normal mood and affect. Her behavior is normal.          Assessment & Plan:  [name] Kitchen.[name] Kitchen.Bridget Johnston was seen today for cough, headache, sore throat, fever and generalized body aches.  Diagnoses and all orders for this visit:  Mild intermittent asthmatic bronchitis with acute exacerbation -     predniSONE (DELTASONE) 50 MG tablet; Take one tablet for 5 days. -     methylPREDNISolone sodium succinate (SOLU-MEDROL) 125 mg/2 mL injection 125 mg; Inject 2 mLs (125 mg total) into  the muscle once. -     methylPREDNISolone acetate (DEPO-MEDROL) injection 80 mg; Inject 1 mL (80 mg total) into the muscle once.  Fever, unspecified fever cause -     POCT Influenza A/B  Body aches -     POCT Influenza A/B  Cough -     POCT Influenza A/B  Wheezing -     ipratropium-albuterol (DUONEB) 0.5-2.5 (3) MG/3ML nebulizer solution 3 mL; Take 3 mLs by nebulization once.  Acute upper respiratory infection -     azithromycin (ZITHROMAX) 250 MG tablet; Take 2 tablets now and then one tablet for 4 days.   Rapid flu negative.  duoneb given in office today with pulse ox improvement from 90 percent to 95 percent.  depomedrol and solumedrol to avoid oral prednisone if we can at patient's request. Given print out if needed. Continue bronchodilators regularly every 2-4 hours.   I do think viral in nature. If symptoms continue for 2-3 more days start zpak that was printed.  Encouraged mucinex bid OTC.

## 2016-06-09 DIAGNOSIS — I519 Heart disease, unspecified: Secondary | ICD-10-CM | POA: Insufficient documentation

## 2016-06-09 DIAGNOSIS — I5189 Other ill-defined heart diseases: Secondary | ICD-10-CM | POA: Insufficient documentation

## 2016-06-11 ENCOUNTER — Encounter: Payer: Self-pay | Admitting: Physician Assistant

## 2016-06-12 ENCOUNTER — Ambulatory Visit (INDEPENDENT_AMBULATORY_CARE_PROVIDER_SITE_OTHER): Payer: 59 | Admitting: Physician Assistant

## 2016-06-12 ENCOUNTER — Encounter: Payer: Self-pay | Admitting: Physician Assistant

## 2016-06-12 VITALS — BP 159/88 | HR 66 | Wt 224.0 lb

## 2016-06-12 DIAGNOSIS — R519 Headache, unspecified: Secondary | ICD-10-CM

## 2016-06-12 DIAGNOSIS — R030 Elevated blood-pressure reading, without diagnosis of hypertension: Secondary | ICD-10-CM | POA: Diagnosis not present

## 2016-06-12 DIAGNOSIS — I5021 Acute systolic (congestive) heart failure: Secondary | ICD-10-CM

## 2016-06-12 DIAGNOSIS — B370 Candidal stomatitis: Secondary | ICD-10-CM

## 2016-06-12 DIAGNOSIS — R51 Headache: Secondary | ICD-10-CM

## 2016-06-12 MED ORDER — LISINOPRIL 10 MG PO TABS: 10.0000 mg | ORAL_TABLET | Freq: Every day | ORAL | 3 refills | Status: DC

## 2016-06-12 MED ORDER — MAGIC MOUTHWASH W/LIDOCAINE: ORAL | 0 refills | Status: DC

## 2016-06-12 NOTE — Telephone Encounter (Signed)
Pt advised.

## 2016-06-12 NOTE — Progress Notes (Signed)
HPI:                                                                Bridget Johnston is a 56 y.o. female who presents to [facility] today for elevated blood pressure  Patient with PMH of asthma was admitted to [facility] 05/31/16-06/09/16 for acute respiratory failure 2/2 pneumonia. She was intubated and sedated, placed on broad-spectrum antibiotics, steroids, and bronchodilators. She was extubated on 06/02/16. She required some BiPAP therapy after extubation. Blood cx were negative and she completed a 7 day course of Levaquin. Echo revealed systolic heart failure with EF 30-35%. She was started on Lisinopril and Metroprolol XL. She was also put on a Prednisone taper. Discharge blood pressure was 127/82.  Patient presents today complaining of high blood pressure and headache x 3 days. Patient states she has no history of HTN or migraines/headaches. Patient describes headache as behind her eyes and pulsatile. She states headaches improve when she takes her blood pressure medications. She denies vision change, paresthesias, or weakness. She denies chest pain with exertion, orthopnea, lightheadedness, syncope and edema.   Of note, she is on Suboxone for chronic opioid addiction, but denies withdrawal symptoms.  Past Medical History:  Diagnosis Date  . Arthritis   . Asthma   . Back pain   . Diabetes mellitus without complication (HCC)   . Finger pain   . Hand pain   . High triglycerides   . Leg pain    Past Surgical History:  Procedure Laterality Date  . ABLATION  2007  . BTL  1988  . NOVASURE ABLATION    . TONSILLECTOMY    . TUBAL LIGATION     Social History  Substance Use Topics  . Smoking status: Former Smoker    Packs/day: 1.00    Years: 17.00    Types: Cigarettes    Quit date: 01/06/2012  . Smokeless tobacco: Never Used  . Alcohol use No   family history includes Cancer - Colon in her maternal grandfather; Diabetes in her  father; Heart disease in her father and mother; Hypertension in her father and mother.  ROS: negative except as noted in the HPI  Medications: Current Outpatient Prescriptions  Medication Sig Dispense Refill  . lisinopril (PRINIVIL,ZESTRIL) 2.5 MG tablet Take by mouth.    . metoprolol succinate (TOPROL-XL) 25 MG 24 hr tablet Take by mouth.    . predniSONE (DELTASONE) 10 MG tablet Take 4 tablets x3 days, 3 tablets x3 days, 2 tablets x3 days, 1 tablet x3 days.    [name] Kitchen albuterol (PROAIR HFA) 108 (90 Base) MCG/ACT inhaler inhale 2 puffs INTO THE LUNGS every 4 hours if needed for wheezing or shortness of breath 8.5 g 2  . AMBULATORY NON FORMULARY MEDICATION Patient reports taking wheat grass to help control her glucose.    . budesonide-formoterol (SYMBICORT) 160-4.5 MCG/ACT inhaler Inhale 2 puffs into the lungs 2 (two) times daily. 1 Inhaler 11  . furosemide (LASIX) 20 MG tablet Take 1 tablet (20 mg total) by mouth daily as needed for fluid or edema. 30 tablet 5  . gabapentin (NEURONTIN) 300 MG capsule Take 1 capsule (300 mg total) by mouth 3 (three) times daily. 90 capsule 5  . omega-3  acid ethyl esters (LOVAZA) 1 g capsule Take 2 capsules (2 g total) by mouth 2 (two) times daily. 120 capsule 5  . SUBOXONE 8-2 MG FILM     . Vilazodone HCl (VIIBRYD) 40 MG TABS Take 1 tablet (40 mg total) by mouth daily. 30 tablet 5   No current facility-administered medications for this visit.    No Known Allergies     Objective:  BP (!) 167/89   Pulse 71   Wt 224 lb (101.6 kg)   BMI 39.68 kg/m  Gen: well-groomed, not ill-appearing, no acute distress HEENT: head normocephalic, atraumatic; normal conjunctiva, oropharynx with white patches consistent with thrush, moist mucus membranes Pulm: Normal work of breathing, normal phonation, diffuse expiratory wheezes, no rales or rhonchi CV: Normal rate, regular rhythm, s1 and s2 distinct, no murmurs, clicks or rubs, no peripheral edema Neuro:  cranial nerves  II-XII intact, no nystagmus, no papilledema, normal finger-to-nose, normal heel-to-shin, negative pronator drift, normal coordination, DTR's intact, normal tone, no tremor MSK: strength 5/5 and symmetric in bilateral upper and lower extremities, normal gait and station Mental Status: alert and oriented x 3, normal speech, cooperative, organized thought content     No results found for this or any previous visit (from the past 72 hour(s)). No results found.    Assessment and Plan: 56 y.o. female with   1. Acute systolic heart failure (HCC) - patient appears euvolemic today - cont ACE and Beta blocker - lisinopril (PRINIVIL,ZESTRIL) 10 MG tablet; Take 1 tablet (10 mg total) by mouth daily.  Dispense: 30 tablet; Refill: 3  2. Elevated blood pressure reading - increasing Lisinopril from 2.5mg  to  - log blood pressures at home - discussed pressures >180/110 warrant phone call or go to emergency room after hours - lisinopril (PRINIVIL,ZESTRIL) 10 MG tablet; Take 1 tablet (10 mg total) by mouth daily.  Dispense: 30 tablet; Refill: 3  3. Oral candidiasis - magic mouthwash w/lidocaine SOLN; Swish 10ml by mouth for 2 minutes then spit out.  Dispense: 500 mL; Refill: 0  4. Acute nonintractable headache - reassuring neurologic exam. No focal deficits - Tylenol  Q8h prn   Patient education and anticipatory guidance given Patient agrees with treatment plan Follow-up in 5 days with PCP or sooner as needed  Levonne Hubert PA-C

## 2016-06-12 NOTE — Telephone Encounter (Signed)
Call pharmacy and refill exact formula that she is using now of magic mouthwash. 1 refill.

## 2016-06-12 NOTE — Patient Instructions (Addendum)
For your blood pressure: - Start Lisinopril  daily - Check blood pressure at home  - Check around the same time each day in a relaxed setting (rest for at least 3 minutes, legs uncrossed, arm above level or your heart) - Limit salt. Follow DASH eating plan   For headache Tylenol  every 6 hours as needed for headache  Continue your other medications  Follow-up with Jade early next week  How to Take Your Blood Pressure Blood pressure is a measurement of how strongly your blood is pressing against the walls of your arteries. Arteries are blood vessels that carry blood from your heart throughout your body. Your health care provider takes your blood pressure at each office visit. You can also take your own blood pressure at home with a blood pressure machine. You may need to take your own blood pressure:  To confirm a diagnosis of high blood pressure (hypertension).  To monitor your blood pressure over time.  To make sure your blood pressure medicine is working. Supplies needed: To take your blood pressure, you will need a blood pressure machine. You can buy a blood pressure machine, or blood pressure monitor, at most drugstores or online. There are several types of home blood pressure monitors. When choosing one, consider the following:  Choose a monitor that has an arm cuff.  Choose a monitor that wraps snugly around your upper arm. You should be able to fit only one finger between your arm and the cuff.  Do not choose a monitor that measures your blood pressure from your wrist or finger. Your health care provider can suggest a reliable monitor that will meet your needs. How to prepare To get the most accurate reading, avoid the following for 30 minutes before you check your blood pressure:  Drinking caffeine.  Drinking alcohol.  Eating.  Smoking.  Exercising. Five minutes before you check your blood pressure:  Empty your bladder.  Sit quietly without talking in  a dining chair, rather than in a soft couch or armchair. How to take your blood pressure To check your blood pressure, follow the instructions in the manual that came with your blood pressure monitor. If you have a digital blood pressure monitor, the instructions may be as follows: 1. Sit up straight. 2. Place your feet on the floor. Do not cross your ankles or legs. 3. Rest your left arm at the level of your heart on a table or desk or on the arm of a chair. 4. Pull up your shirt sleeve. 5. Wrap the blood pressure cuff around the upper part of your left arm, 1 inch (2.5 cm) above your elbow. It is best to wrap the cuff around bare skin. 6. Fit the cuff snugly around your arm. You should be able to place only one finger between the cuff and your arm. 7. Position the cord inside the groove of your elbow. 8. Press the power button. 9. Sit quietly while the cuff inflates and deflates. 10. Read the digital reading on the monitor screen and write it down (record it). 11. Wait 2-3 minutes, then repeat the steps, starting at step 1. What does my blood pressure reading mean? A blood pressure reading consists of a higher number over a lower number. Ideally, your blood pressure should be below 120/80. The first ("top") number is called the systolic pressure. It is a measure of the pressure in your arteries as your heart beats. The second ("bottom") number is called the diastolic pressure. It  is a measure of the pressure in your arteries as the heart relaxes. Blood pressure is classified into four stages. The following are the stages for adults who do not have a short-term serious illness or a chronic condition. Systolic pressure and diastolic pressure are measured in a unit called mm Hg. Normal   Systolic pressure: below 120.  Diastolic pressure: below 80. Elevated   Systolic pressure: 120-129.  Diastolic pressure: below 80. Hypertension stage 1   Systolic pressure: 130-139.  Diastolic pressure:  80-89. Hypertension stage 2   Systolic pressure: 140 or above.  Diastolic pressure: 90 or above. You can have prehypertension or hypertension even if only the systolic or only the diastolic number in your reading is higher than normal. Follow these instructions at home:  Check your blood pressure as often as recommended by your health care provider.  Take your monitor to the next appointment with your health care provider to make sure:  That you are using it correctly.  That it provides accurate readings.  Be sure you understand what your goal blood pressure numbers are.  Tell your health care provider if you are having any side effects from blood pressure medicine. Contact a health care provider if:  Your blood pressure is consistently high. Get help right away if:  Your systolic blood pressure is higher than 180.  Your diastolic blood pressure is higher than 110. This information is not intended to replace advice given to you by your health care provider. Make sure you discuss any questions you have with your health care provider. Document Released: 08/02/2015 Document Revised: 10/15/2015 Document Reviewed: 08/02/2015 Elsevier Interactive Patient Education  2017 [employer]vier Inc.   DASH Eating Plan DASH stands for "Dietary Approaches to Stop Hypertension." The DASH eating plan is a healthy eating plan that has been shown to reduce high blood pressure (hypertension). It may also reduce your risk for type 2 diabetes, heart disease, and stroke. The DASH eating plan may also help with weight loss. What are tips for following this plan? General guidelines   Avoid eating more than 2,300 mg (milligrams) of salt (sodium) a day. If you have hypertension, you may need to reduce your sodium intake to 1,500 mg a day.  Limit alcohol intake to no more than 1 drink a day for nonpregnant women and 2 drinks a day for men. One drink equals 12 oz of beer, 5 oz of wine, or 1 oz of hard  liquor.  Work with your health care provider to maintain a healthy body weight or to lose weight. Ask what an ideal weight is for you.  Get at least 30 minutes of exercise that causes your heart to beat faster (aerobic exercise) most days of the week. Activities may include walking, swimming, or biking.  Work with your health care provider or diet and nutrition specialist (dietitian) to adjust your eating plan to your individual calorie needs. Reading food labels   Check food labels for the amount of sodium per serving. Choose foods with less than 5 percent of the Daily Value of sodium. Generally, foods with less than 300 mg of sodium per serving fit into this eating plan.  To find whole grains, look for the word "whole" as the first word in the ingredient list. Shopping   Buy products labeled as "low-sodium" or "no salt added."  Buy fresh foods. Avoid canned foods and premade or frozen meals. Cooking   Avoid adding salt when cooking. Use salt-free seasonings or herbs instead of  table salt or sea salt. Check with your health care provider or pharmacist before using salt substitutes.  Do not fry foods. Cook foods using healthy methods such as baking, boiling, grilling, and broiling instead.  Cook with heart-healthy oils, such as olive, canola, soybean, or sunflower oil. Meal planning    Eat a balanced diet that includes:  5 or more servings of fruits and vegetables each day. At each meal, try to fill half of your plate with fruits and vegetables.  Up to 6-8 servings of whole grains each day.  Less than 6 oz of lean meat, poultry, or fish each day. A 3-oz serving of meat is about the same size as a deck of cards. One egg equals 1 oz.  2 servings of low-fat dairy each day.  A serving of nuts, seeds, or beans 5 times each week.  Heart-healthy fats. Healthy fats called Omega-3 fatty acids are found in foods such as flaxseeds and coldwater fish, like sardines, salmon, and  mackerel.  Limit how much you eat of the following:  Canned or prepackaged foods.  Food that is high in trans fat, such as fried foods.  Food that is high in saturated fat, such as fatty meat.  Sweets, desserts, sugary drinks, and other foods with added sugar.  Full-fat dairy products.  Do not salt foods before eating.  Try to eat at least 2 vegetarian meals each week.  Eat more home-cooked food and less restaurant, buffet, and fast food.  When eating at a restaurant, ask that your food be prepared with less salt or no salt, if possible. What foods are recommended? The items listed may not be a complete list. Talk with your dietitian about what dietary choices are best for you. Grains  Whole-grain or whole-wheat bread. Whole-grain or whole-wheat pasta. Brown rice. Orpah Cobb. Bulgur. Whole-grain and low-sodium cereals. Pita bread. Low-fat, low-sodium crackers. Whole-wheat flour tortillas. Vegetables  Fresh or frozen vegetables (raw, steamed, roasted, or grilled). Low-sodium or reduced-sodium tomato and vegetable juice. Low-sodium or reduced-sodium tomato sauce and tomato paste. Low-sodium or reduced-sodium canned vegetables. Fruits  All fresh, dried, or frozen fruit. Canned fruit in natural juice (without added sugar). Meat and other protein foods  Skinless chicken or [place]. Ground chicken or [place]. Pork with fat trimmed off. Fish and seafood. Egg whites. Dried beans, peas, or lentils. Unsalted nuts, nut butters, and seeds. Unsalted canned beans. Lean cuts of beef with fat trimmed off. Low-sodium, lean deli meat. Dairy  Low-fat (1%) or fat-free (skim) milk. Fat-free, low-fat, or reduced-fat cheeses. Nonfat, low-sodium ricotta or cottage cheese. Low-fat or nonfat yogurt. Low-fat, low-sodium cheese. Fats and oils  Soft margarine without trans fats. Vegetable oil. Low-fat, reduced-fat, or light mayonnaise and salad dressings (reduced-sodium). Canola, safflower, olive, soybean,  and sunflower oils. Avocado. Seasoning and other foods  Herbs. Spices. Seasoning mixes without salt. Unsalted popcorn and pretzels. Fat-free sweets. What foods are not recommended? The items listed may not be a complete list. Talk with your dietitian about what dietary choices are best for you. Grains  Baked goods made with fat, such as croissants, muffins, or some breads. Dry pasta or rice meal packs. Vegetables  Creamed or fried vegetables. Vegetables in a cheese sauce. Regular canned vegetables (not low-sodium or reduced-sodium). Regular canned tomato sauce and paste (not low-sodium or reduced-sodium). Regular tomato and vegetable juice (not low-sodium or reduced-sodium). Rosita Fire. Olives. Fruits  Canned fruit in a light or heavy syrup. Fried fruit. Fruit in cream or butter sauce. Meat  and other protein foods  Fatty cuts of meat. Ribs. Fried meat. Tomasa Blase. Sausage. Bologna and other processed lunch meats. Salami. Fatback. Hotdogs. Bratwurst. Salted nuts and seeds. Canned beans with added salt. Canned or smoked fish. Whole eggs or egg yolks. Chicken or [place] with skin. Dairy  Whole or 2% milk, cream, and half-and-half. Whole or full-fat cream cheese. Whole-fat or sweetened yogurt. Full-fat cheese. Nondairy creamers. Whipped toppings. Processed cheese and cheese spreads. Fats and oils  Butter. Stick margarine. Lard. Shortening. Ghee. Bacon fat. Tropical oils, such as coconut, palm kernel, or palm oil. Seasoning and other foods  Salted popcorn and pretzels. Onion salt, garlic salt, seasoned salt, table salt, and sea salt. Worcestershire sauce. Tartar sauce. Barbecue sauce. Teriyaki sauce. Soy sauce, including reduced-sodium. Steak sauce. Canned and packaged gravies. Fish sauce. Oyster sauce. Cocktail sauce. Horseradish that you find on the shelf. Ketchup. Mustard. Meat flavorings and tenderizers. Bouillon cubes. Hot sauce and Tabasco sauce. Premade or packaged marinades. Premade or packaged taco  seasonings. Relishes. Regular salad dressings. Where to find more information:  National Heart, Lung, and Blood Institute: PopSteam.is  American Heart Association: www.heart.org Summary  The DASH eating plan is a healthy eating plan that has been shown to reduce high blood pressure (hypertension). It may also reduce your risk for type 2 diabetes, heart disease, and stroke.  With the DASH eating plan, you should limit salt (sodium) intake to 2,300 mg a day. If you have hypertension, you may need to reduce your sodium intake to 1,500 mg a day.  When on the DASH eating plan, aim to eat more fresh fruits and vegetables, whole grains, lean proteins, low-fat dairy, and heart-healthy fats.  Work with your health care provider or diet and nutrition specialist (dietitian) to adjust your eating plan to your individual calorie needs. This information is not intended to replace advice given to you by your health care provider. Make sure you discuss any questions you have with your health care provider. Document Released: 02/12/2011 Document Revised: 02/17/2016 Document Reviewed: 02/17/2016 Elsevier Interactive Patient Education  2017 [employer].

## 2016-06-12 NOTE — Telephone Encounter (Signed)
Rx has been sent  

## 2016-06-14 ENCOUNTER — Encounter: Payer: Self-pay | Admitting: Physician Assistant

## 2016-06-17 ENCOUNTER — Encounter: Payer: Self-pay | Admitting: Physician Assistant

## 2016-06-17 ENCOUNTER — Ambulatory Visit (INDEPENDENT_AMBULATORY_CARE_PROVIDER_SITE_OTHER): Payer: 59 | Admitting: Physician Assistant

## 2016-06-17 VITALS — BP 104/69 | HR 101 | Ht 63.0 in | Wt 218.0 lb

## 2016-06-17 DIAGNOSIS — E7800 Pure hypercholesterolemia, unspecified: Secondary | ICD-10-CM

## 2016-06-17 DIAGNOSIS — Z79899 Other long term (current) drug therapy: Secondary | ICD-10-CM | POA: Diagnosis not present

## 2016-06-17 DIAGNOSIS — R4789 Other speech disturbances: Secondary | ICD-10-CM | POA: Diagnosis not present

## 2016-06-17 DIAGNOSIS — J4521 Mild intermittent asthma with (acute) exacerbation: Secondary | ICD-10-CM

## 2016-06-17 DIAGNOSIS — J9601 Acute respiratory failure with hypoxia: Secondary | ICD-10-CM | POA: Diagnosis not present

## 2016-06-17 DIAGNOSIS — I5021 Acute systolic (congestive) heart failure: Secondary | ICD-10-CM | POA: Diagnosis not present

## 2016-06-17 DIAGNOSIS — I952 Hypotension due to drugs: Secondary | ICD-10-CM

## 2016-06-17 DIAGNOSIS — R4701 Aphasia: Secondary | ICD-10-CM

## 2016-06-17 NOTE — Progress Notes (Signed)
Subjective:    Patient ID: Bridget Johnston, female    DOB: 11/29/1960, 56 y.o.   MRN: [id number]  HPI  Pt is a 56 yo female with a hx of asthma who presents to the clinic to follow up after 3/25 admission for acute respiratory and heart failure. She was seen in clinic 2 days before with what appeared to be an acute asthma exacerbation with new onset bronchitis. duoneb given in office with great relief and prednisone sent home. Her symptoms actuely worsened. She was started on lisinopril, metoprolol in hospital. She was on prednisone but causing her BP to go extremely high and even saw partner in office and lisinopril was increased. Continue on symbicort. Pt is doing well today. No problems breathing. Her daughter is concerned because today she had a 20 minute period of some confusion and seemed to not be able to say simple words like "purple". No facial drooping, limb weakness at that time. Pt does not have any symptoms presently.   Suggested from [facility] to follow up was pulmonology and cardiology for repeat stress test/echo.       Review of Systems See HPI.     Objective:   Physical Exam  Constitutional: She is oriented to person, place, and time. She appears well-developed and well-nourished.  HENT:  Head: Normocephalic and atraumatic.  Eyes: Conjunctivae and EOM are normal. Pupils are equal, round, and reactive to light. Right eye exhibits no discharge. Left eye exhibits no discharge.  Neck: Normal range of motion. Neck supple.  Cardiovascular: Regular rhythm and normal heart sounds.   tachycardia  Pulmonary/Chest: Effort normal and breath sounds normal. She has no wheezes.  Lymphadenopathy:    She has no cervical adenopathy.  Neurological: She is alert and oriented to person, place, and time. No cranial nerve deficit. Coordination normal.  Normal gait.  Upper and lower extremity strength 5/5.  rhomberg negative.  Finger to nose intact.  Psychiatric: She has a normal mood and  affect. Her behavior is normal.          Assessment & Plan:  [name] KitchenMarland KitchenBellami was seen today for hospitalization follow-up.  Diagnoses and all orders for this visit:  Acute systolic heart failure (HCC) -     Ambulatory referral to Cardiology  Mild intermittent asthma with acute exacerbation -     Ambulatory referral to Pulmonology  Word finding difficulty -     US Carotid Bilateral; Future -     US Carotid Bilateral -     atorvastatin (LIPITOR) 40 MG tablet; Take 1 tablet (40 mg total) by mouth daily.  Aphasia -     US Carotid Bilateral; Future -     US Carotid Bilateral -     atorvastatin (LIPITOR) 40 MG tablet; Take 1 tablet (40 mg total) by mouth daily.  Pure hypercholesterolemia -     atorvastatin (LIPITOR) 40 MG tablet; Take 1 tablet (40 mg total) by mouth daily.  Acute respiratory failure with hypoxia (HCC) -     Ambulatory referral to Pulmonology  Hypotension due to drugs  no neuro deficts found on todays exam. LDL one year ago was 143. Need to recheck.  In light of new episodes. Would like to start lipitor. Discussed side effect with nurse phone call.  Will get carotid dopplers.  Unclear etiology but could represent a TIA.   Stay on symbicort daily. Albuterol as needed.   Referrals made to pulmonology and cardiology.   Decreased lisinopril to  due to low  BP. This should give her the beneficial effects on the cardiac level and hopefully not run BP too low. Continue on metoprolol.   Recheck in 4 weeks.

## 2016-06-17 NOTE — Patient Instructions (Addendum)
Cut lisinopril in half.  Get carotid dopplers.  Refer to pulmonology/cardiology.

## 2016-06-18 ENCOUNTER — Encounter: Payer: Self-pay | Admitting: Physician Assistant

## 2016-06-19 ENCOUNTER — Encounter: Payer: Self-pay | Admitting: Physician Assistant

## 2016-06-19 DIAGNOSIS — I952 Hypotension due to drugs: Secondary | ICD-10-CM | POA: Insufficient documentation

## 2016-06-19 DIAGNOSIS — J9601 Acute respiratory failure with hypoxia: Secondary | ICD-10-CM | POA: Insufficient documentation

## 2016-06-19 MED ORDER — ATORVASTATIN CALCIUM 40 MG PO TABS: 40.0000 mg | ORAL_TABLET | Freq: Every day | ORAL | 5 refills | Status: DC

## 2016-06-19 NOTE — Telephone Encounter (Signed)
Will you call patient as looking through chart saw she has had quite the LDL elevation and in light of new concerns with episode of difficultly finding word etc I would like for her to start a statin. I have already sent lipitor to pharmacy once a day. Also start a daily ASA .   Watch for any muscle aches or pain as the biggest side effect with statin.

## 2016-06-21 ENCOUNTER — Inpatient Hospital Stay (HOSPITAL_COMMUNITY): Payer: 59

## 2016-06-21 ENCOUNTER — Emergency Department (HOSPITAL_COMMUNITY): Payer: 59

## 2016-06-21 ENCOUNTER — Encounter (HOSPITAL_COMMUNITY): Payer: Self-pay | Admitting: Emergency Medicine

## 2016-06-21 ENCOUNTER — Inpatient Hospital Stay (HOSPITAL_COMMUNITY)
Admission: EM | Admit: 2016-06-21 | Discharge: 2016-06-25 | DRG: 097 | Disposition: A | Discharge status: Home Health | Payer: 59 | Attending: Internal Medicine | Admitting: Internal Medicine

## 2016-06-21 DIAGNOSIS — E781 Pure hyperglyceridemia: Secondary | ICD-10-CM | POA: Diagnosis present

## 2016-06-21 DIAGNOSIS — Z888 Allergy status to other drugs, medicaments and biological substances status: Secondary | ICD-10-CM

## 2016-06-21 DIAGNOSIS — A86 Unspecified viral encephalitis: Secondary | ICD-10-CM | POA: Diagnosis present

## 2016-06-21 DIAGNOSIS — J45909 Unspecified asthma, uncomplicated: Secondary | ICD-10-CM | POA: Diagnosis present

## 2016-06-21 DIAGNOSIS — E785 Hyperlipidemia, unspecified: Secondary | ICD-10-CM | POA: Diagnosis present

## 2016-06-21 DIAGNOSIS — I519 Heart disease, unspecified: Secondary | ICD-10-CM | POA: Diagnosis present

## 2016-06-21 DIAGNOSIS — F418 Other specified anxiety disorders: Secondary | ICD-10-CM | POA: Diagnosis present

## 2016-06-21 DIAGNOSIS — I6300 Cerebral infarction due to thrombosis of unspecified precerebral artery: Secondary | ICD-10-CM

## 2016-06-21 DIAGNOSIS — R739 Hyperglycemia, unspecified: Secondary | ICD-10-CM | POA: Diagnosis not present

## 2016-06-21 DIAGNOSIS — R4701 Aphasia: Secondary | ICD-10-CM | POA: Diagnosis not present

## 2016-06-21 DIAGNOSIS — I639 Cerebral infarction, unspecified: Secondary | ICD-10-CM | POA: Diagnosis not present

## 2016-06-21 DIAGNOSIS — Z6839 Body mass index (BMI) 39.0-39.9, adult: Secondary | ICD-10-CM | POA: Diagnosis not present

## 2016-06-21 DIAGNOSIS — E871 Hypo-osmolality and hyponatremia: Secondary | ICD-10-CM | POA: Diagnosis present

## 2016-06-21 DIAGNOSIS — E669 Obesity, unspecified: Secondary | ICD-10-CM | POA: Diagnosis not present

## 2016-06-21 DIAGNOSIS — F419 Anxiety disorder, unspecified: Secondary | ICD-10-CM | POA: Diagnosis not present

## 2016-06-21 DIAGNOSIS — E7800 Pure hypercholesterolemia, unspecified: Secondary | ICD-10-CM | POA: Diagnosis present

## 2016-06-21 DIAGNOSIS — F17290 Nicotine dependence, other tobacco product, uncomplicated: Secondary | ICD-10-CM | POA: Diagnosis present

## 2016-06-21 DIAGNOSIS — I5022 Chronic systolic (congestive) heart failure: Secondary | ICD-10-CM

## 2016-06-21 DIAGNOSIS — I10 Essential (primary) hypertension: Secondary | ICD-10-CM | POA: Diagnosis present

## 2016-06-21 DIAGNOSIS — R509 Fever, unspecified: Secondary | ICD-10-CM | POA: Diagnosis not present

## 2016-06-21 DIAGNOSIS — R471 Dysarthria and anarthria: Secondary | ICD-10-CM | POA: Diagnosis not present

## 2016-06-21 DIAGNOSIS — G0490 Encephalitis and encephalomyelitis, unspecified: Secondary | ICD-10-CM | POA: Diagnosis not present

## 2016-06-21 DIAGNOSIS — F329 Major depressive disorder, single episode, unspecified: Secondary | ICD-10-CM | POA: Diagnosis present

## 2016-06-21 DIAGNOSIS — B003 Herpesviral meningitis: Secondary | ICD-10-CM

## 2016-06-21 DIAGNOSIS — F1121 Opioid dependence, in remission: Secondary | ICD-10-CM | POA: Diagnosis not present

## 2016-06-21 DIAGNOSIS — Z8249 Family history of ischemic heart disease and other diseases of the circulatory system: Secondary | ICD-10-CM

## 2016-06-21 DIAGNOSIS — F1120 Opioid dependence, uncomplicated: Secondary | ICD-10-CM | POA: Diagnosis present

## 2016-06-21 DIAGNOSIS — I5189 Other ill-defined heart diseases: Secondary | ICD-10-CM | POA: Diagnosis present

## 2016-06-21 DIAGNOSIS — E1169 Type 2 diabetes mellitus with other specified complication: Secondary | ICD-10-CM

## 2016-06-21 DIAGNOSIS — E1165 Type 2 diabetes mellitus with hyperglycemia: Secondary | ICD-10-CM | POA: Diagnosis present

## 2016-06-21 DIAGNOSIS — I110 Hypertensive heart disease with heart failure: Secondary | ICD-10-CM | POA: Diagnosis present

## 2016-06-21 DIAGNOSIS — R4702 Dysphasia: Secondary | ICD-10-CM | POA: Diagnosis not present

## 2016-06-21 DIAGNOSIS — E782 Mixed hyperlipidemia: Secondary | ICD-10-CM | POA: Diagnosis not present

## 2016-06-21 DIAGNOSIS — Z833 Family history of diabetes mellitus: Secondary | ICD-10-CM | POA: Diagnosis not present

## 2016-06-21 DIAGNOSIS — M419 Scoliosis, unspecified: Secondary | ICD-10-CM | POA: Diagnosis present

## 2016-06-21 DIAGNOSIS — Z8673 Personal history of transient ischemic attack (TIA), and cerebral infarction without residual deficits: Secondary | ICD-10-CM | POA: Diagnosis present

## 2016-06-21 DIAGNOSIS — Z7951 Long term (current) use of inhaled steroids: Secondary | ICD-10-CM

## 2016-06-21 DIAGNOSIS — J4530 Mild persistent asthma, uncomplicated: Secondary | ICD-10-CM | POA: Diagnosis not present

## 2016-06-21 DIAGNOSIS — G9349 Other encephalopathy: Secondary | ICD-10-CM | POA: Diagnosis present

## 2016-06-21 LAB — COMPREHENSIVE METABOLIC PANEL
ALK PHOS: 50 U/L (ref 38–126)
ALT: 33 U/L (ref 14–54)
AST: 21 U/L (ref 15–41)
Albumin: 3.9 g/dL (ref 3.5–5.0)
Anion gap: 9 (ref 5–15)
BUN: 17 mg/dL (ref 6–20)
CALCIUM: 9.2 mg/dL (ref 8.9–10.3)
CO2: 26 mmol/L (ref 22–32)
CREATININE: 0.94 mg/dL (ref 0.44–1.00)
Chloride: 99 mmol/L — ABNORMAL LOW (ref 101–111)
Glucose, Bld: 150 mg/dL — ABNORMAL HIGH (ref 65–99)
Potassium: 3.7 mmol/L (ref 3.5–5.1)
SODIUM: 134 mmol/L — AB (ref 135–145)
Total Bilirubin: 0.7 mg/dL (ref 0.3–1.2)
Total Protein: 6.5 g/dL (ref 6.5–8.1)

## 2016-06-21 LAB — RAPID URINE DRUG SCREEN, HOSP PERFORMED
AMPHETAMINES: NOT DETECTED
BARBITURATES: NOT DETECTED
Benzodiazepines: NOT DETECTED
Cocaine: NOT DETECTED
Opiates: NOT DETECTED
Tetrahydrocannabinol: NOT DETECTED

## 2016-06-21 LAB — GLUCOSE, CAPILLARY
GLUCOSE-CAPILLARY: 125 mg/dL — AB (ref 65–99)
Glucose-Capillary: 147 mg/dL — ABNORMAL HIGH (ref 65–99)
Glucose-Capillary: 127 mg/dL — ABNORMAL HIGH (ref 65–99)
Glucose-Capillary: 123 mg/dL — ABNORMAL HIGH (ref 65–99)

## 2016-06-21 LAB — I-STAT CHEM 8, ED
BUN: 18 mg/dL (ref 6–20)
CREATININE: 0.9 mg/dL (ref 0.44–1.00)
Calcium, Ion: 1.1 mmol/L — ABNORMAL LOW (ref 1.15–1.40)
Chloride: 98 mmol/L — ABNORMAL LOW (ref 101–111)
Glucose, Bld: 146 mg/dL — ABNORMAL HIGH (ref 65–99)
HEMATOCRIT: 36 % (ref 36.0–46.0)
HEMOGLOBIN: 12.2 g/dL (ref 12.0–15.0)
POTASSIUM: 3.7 mmol/L (ref 3.5–5.1)
SODIUM: 134 mmol/L — AB (ref 135–145)
TCO2: 27 mmol/L (ref 0–100)

## 2016-06-21 LAB — CBC
HCT: 36.3 % (ref 36.0–46.0)
Hemoglobin: 12.6 g/dL (ref 12.0–15.0)
MCH: 32.2 pg (ref 26.0–34.0)
MCHC: 34.7 g/dL (ref 30.0–36.0)
MCV: 92.8 fL (ref 78.0–100.0)
Platelets: 270 10*3/uL (ref 150–400)
RBC: 3.91 MIL/uL (ref 3.87–5.11)
RDW: 13 % (ref 11.5–15.5)
WBC: 7.2 10*3/uL (ref 4.0–10.5)

## 2016-06-21 LAB — DIFFERENTIAL
Basophils Absolute: 0 10*3/uL (ref 0.0–0.1)
Basophils Relative: 0 %
Eosinophils Absolute: 0 10*3/uL (ref 0.0–0.7)
Eosinophils Relative: 1 %
LYMPHS PCT: 22 %
Lymphs Abs: 1.6 10*3/uL (ref 0.7–4.0)
MONO ABS: 0.3 10*3/uL (ref 0.1–1.0)
MONOS PCT: 4 %
NEUTROS ABS: 5.3 10*3/uL (ref 1.7–7.7)
Neutrophils Relative %: 73 %

## 2016-06-21 LAB — URINALYSIS, ROUTINE W REFLEX MICROSCOPIC
BILIRUBIN URINE: NEGATIVE
Glucose, UA: 50 mg/dL — AB
Hgb urine dipstick: NEGATIVE
Ketones, ur: NEGATIVE mg/dL
Leukocytes, UA: NEGATIVE
Nitrite: NEGATIVE
PROTEIN: NEGATIVE mg/dL
SPECIFIC GRAVITY, URINE: 1.045 — AB (ref 1.005–1.030)
pH: 5 (ref 5.0–8.0)

## 2016-06-21 LAB — PROTIME-INR
INR: 0.87
PROTHROMBIN TIME: 11.8 s (ref 11.4–15.2)

## 2016-06-21 LAB — LIPID PANEL
CHOLESTEROL: 235 mg/dL — AB (ref 0–200)
HDL: 48 mg/dL (ref >40)
LDL Cholesterol: 165 mg/dL — ABNORMAL HIGH (ref 0–99)
TRIGLYCERIDES: 108 mg/dL (ref <150)
Total CHOL/HDL Ratio: 4.9 RATIO
VLDL: 22 mg/dL (ref 0–40)

## 2016-06-21 LAB — HIV ANTIBODY (ROUTINE TESTING W REFLEX): HIV Screen 4th Generation wRfx: NONREACTIVE

## 2016-06-21 LAB — APTT: APTT: 25 s (ref 24–36)

## 2016-06-21 LAB — I-STAT TROPONIN, ED: Troponin i, poc: 0 ng/mL (ref 0.00–0.08)

## 2016-06-21 LAB — ETHANOL

## 2016-06-21 MED ORDER — SODIUM CHLORIDE 0.9 % IV SOLN
INTRAVENOUS | Status: DC
  Administered 2016-06-21: 08:00:00 via INTRAVENOUS

## 2016-06-21 MED ORDER — METOPROLOL TARTRATE 5 MG/5ML IV SOLN
5.0000 mg | INTRAVENOUS | Status: DC | PRN
  Administered 2016-06-21: 5 mg via INTRAVENOUS
  Filled 2016-06-21: qty 5

## 2016-06-21 MED ORDER — ASPIRIN 325 MG PO TABS
325.0000 mg | ORAL_TABLET | Freq: Every day | ORAL | Status: DC
  Administered 2016-06-22 – 2016-06-25 (×3): 325 mg via ORAL
  Filled 2016-06-21 (×5): qty 1

## 2016-06-21 MED ORDER — ACETAMINOPHEN 650 MG RE SUPP
650.0000 mg | RECTAL | Status: DC | PRN
  Administered 2016-06-21: 650 mg via RECTAL
  Filled 2016-06-21: qty 1

## 2016-06-21 MED ORDER — METOPROLOL SUCCINATE ER 25 MG PO TB24
25.0000 mg | ORAL_TABLET | Freq: Every day | ORAL | Status: DC
  Administered 2016-06-22 – 2016-06-25 (×3): 25 mg via ORAL
  Filled 2016-06-21 (×5): qty 1

## 2016-06-21 MED ORDER — MOMETASONE FURO-FORMOTEROL FUM 200-5 MCG/ACT IN AERO
2.0000 | INHALATION_SPRAY | Freq: Two times a day (BID) | RESPIRATORY_TRACT | Status: DC
  Administered 2016-06-21 – 2016-06-25 (×9): 2 via RESPIRATORY_TRACT
  Filled 2016-06-21: qty 8.8

## 2016-06-21 MED ORDER — STROKE: EARLY STAGES OF RECOVERY BOOK
Freq: Once | Status: AC
  Administered 2016-06-21: 08:00:00
  Filled 2016-06-21: qty 1

## 2016-06-21 MED ORDER — ASPIRIN 300 MG RE SUPP
300.0000 mg | Freq: Every day | RECTAL | Status: DC
  Administered 2016-06-21: 300 mg via RECTAL
  Filled 2016-06-21: qty 1

## 2016-06-21 MED ORDER — GABAPENTIN 300 MG PO CAPS
300.0000 mg | ORAL_CAPSULE | Freq: Three times a day (TID) | ORAL | Status: DC
  Administered 2016-06-22 – 2016-06-25 (×10): 300 mg via ORAL
  Filled 2016-06-21 (×12): qty 1

## 2016-06-21 MED ORDER — ACETAMINOPHEN 160 MG/5ML PO SOLN: 650.0000 mg | ORAL | Status: DC | PRN

## 2016-06-21 MED ORDER — BUPRENORPHINE HCL 8 MG SL SUBL
8.0000 mg | SUBLINGUAL_TABLET | Freq: Every day | SUBLINGUAL | Status: DC
  Administered 2016-06-21 – 2016-06-25 (×5): 8 mg via SUBLINGUAL
  Filled 2016-06-21 (×7): qty 1

## 2016-06-21 MED ORDER — IOPAMIDOL (ISOVUE-370) INJECTION 76%
INTRAVENOUS | Status: AC
  Filled 2016-06-21: qty 50

## 2016-06-21 MED ORDER — KCL IN DEXTROSE-NACL 40-5-0.9 MEQ/L-%-% IV SOLN
INTRAVENOUS | Status: DC
  Administered 2016-06-21 – 2016-06-25 (×5): via INTRAVENOUS
  Filled 2016-06-21 (×8): qty 1000

## 2016-06-21 MED ORDER — ATORVASTATIN CALCIUM 40 MG PO TABS
40.0000 mg | ORAL_TABLET | Freq: Every day | ORAL | Status: DC
  Administered 2016-06-22 – 2016-06-24 (×3): 40 mg via ORAL
  Filled 2016-06-21 (×4): qty 1

## 2016-06-21 MED ORDER — INSULIN ASPART 100 UNIT/ML ~~LOC~~ SOLN
0.0000 [IU] | SUBCUTANEOUS | Status: DC
  Administered 2016-06-21 (×4): 1 [IU] via SUBCUTANEOUS

## 2016-06-21 MED ORDER — VILAZODONE HCL 40 MG PO TABS
40.0000 mg | ORAL_TABLET | Freq: Every day | ORAL | Status: DC
Start: 2016-06-21 — End: 2016-06-25
  Administered 2016-06-22 – 2016-06-25 (×4): 40 mg via ORAL
  Filled 2016-06-21 (×5): qty 1

## 2016-06-21 MED ORDER — KETOROLAC TROMETHAMINE 30 MG/ML IJ SOLN
30.0000 mg | Freq: Once | INTRAMUSCULAR | Status: AC
  Administered 2016-06-21: 30 mg via INTRAVENOUS
  Filled 2016-06-21: qty 1

## 2016-06-21 MED ORDER — ALBUTEROL SULFATE (2.5 MG/3ML) 0.083% IN NEBU: 2.5000 mg | INHALATION_SOLUTION | RESPIRATORY_TRACT | Status: DC | PRN

## 2016-06-21 MED ORDER — SENNOSIDES-DOCUSATE SODIUM 8.6-50 MG PO TABS: 1.0000 | ORAL_TABLET | Freq: Every evening | ORAL | Status: DC | PRN

## 2016-06-21 MED ORDER — ACETAMINOPHEN 325 MG PO TABS: 650.0000 mg | ORAL_TABLET | ORAL | Status: DC | PRN

## 2016-06-21 MED ORDER — ENOXAPARIN SODIUM 40 MG/0.4ML ~~LOC~~ SOLN
40.0000 mg | Freq: Every day | SUBCUTANEOUS | Status: DC
  Administered 2016-06-21: 40 mg via SUBCUTANEOUS
  Filled 2016-06-21 (×2): qty 0.4

## 2016-06-21 MED ORDER — LABETALOL HCL 5 MG/ML IV SOLN: 5.0000 mg | INTRAVENOUS | Status: DC | PRN

## 2016-06-21 MED ORDER — OMEGA-3-ACID ETHYL ESTERS 1 G PO CAPS
2.0000 g | ORAL_CAPSULE | Freq: Two times a day (BID) | ORAL | Status: DC
  Administered 2016-06-22 – 2016-06-25 (×7): 2 g via ORAL
  Filled 2016-06-21 (×8): qty 2

## 2016-06-21 NOTE — ED Notes (Signed)
Patient transported to X-ray 

## 2016-06-21 NOTE — Evaluation (Signed)
Physical Therapy Evaluation Patient Details Name: Bridget Johnston MRN: [id number] DOB: 27-Jan-1961 Today's Date: 06/21/2016   History of Present Illness  Pt is a 56 y.o. female who presented to the ED with speech difficulty. She was recently admitted to [facility] with pneumonia requiring endotracheal intubation. The evening of arrival to ED, pt's husband noticed confusion while playing a game and increasing speech difficulty with slow responses. She has a PMH significant for hypertension, hyperlipidemia, depression with anxiety, and opiate dependence.   Clinical Impression   Pt presents to PT today with decreased tolerance for functional mobility due to R sided neglect, altered mental status, expressive and receptive communication deficits, and general weakness.  PTA, she was independent with all mobility. She required mod +2 assist to stand and min +2 assist to stand pivot to and from the [facility] today.  She will benefit from continued rehab services at the acute level, and following D/C at the CIR level in order to maximize return to PLOF.    Follow Up Recommendations CIR;Supervision/Assistance - 24 hour    Equipment Recommendations  None recommended by PT (TBD by next venue of care)    Recommendations for Other Services       Precautions / Restrictions Precautions Precautions: Fall Restrictions Weight Bearing Restrictions: No      Mobility  Bed Mobility Overal bed mobility: Needs Assistance Bed Mobility: Supine to Sit;Sit to Supine     Supine to sit: Min assist Sit to supine: Mod assist;Max assist;+2 for physical assistance   General bed mobility comments: Pt able to arrive in long sitting from a slightly inclined position.  Min assistance required to bring R leg off EOB and to scoot R hip to EOB.  +2 assist needed for sit >supine, min assist for trunk and max assist to elevate legs into bed.  +2 max to scoot pt up in bed with bed in trendelenberg .  Transfers Overall transfer  level: Needs assistance Equipment used: 2 person hand held assist Transfers: Sit to/from [employer] Sit to Stand: Mod assist;+2 physical assistance Stand pivot transfers: Min assist;+2 safety/equipment       General transfer comment: Mod +2 assist for sit>stand for R knee blocking and power up but pt did not note knee buckle. Pt required min assist for pivot to [facility] and +2 assist for equipment and safety.  VC's required for hand placement.  Ambulation/Gait             General Gait Details: not attempted this session  Stairs            Wheelchair Mobility    Modified Rankin (Stroke Patients Only) Modified Rankin (Stroke Patients Only) Pre-Morbid Rankin Score: No symptoms Modified Rankin: Severe disability     Balance Overall balance assessment: Needs assistance Sitting-balance support: Bilateral upper extremity supported (long sitting in bed) Sitting balance-Leahy Scale: Fair     Standing balance support: Bilateral upper extremity supported Standing balance-Leahy Scale: Poor Standing balance comment: Pt tolerated standing with +2 hand held assist                             Pertinent Vitals/Pain Pain Assessment: Faces Faces Pain Scale: No hurt Pain Intervention(s): Monitored during session    Home Living Family/patient expects to be discharged to:: Private residence Living Arrangements: Spouse/significant other Available Help at Discharge: Family;Available 24 hours/day Type of Home: House Home Access: Stairs to enter Entrance Stairs-Rails: None Entrance Stairs-Number of  Steps: 1 Home Layout: One level Home Equipment: None      Prior Function Level of Independence: Independent         Comments: Independent with all. 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     Hand Dominance   Dominant Hand: Right    Extremity/Trunk Assessment   Upper Extremity Assessment Upper Extremity Assessment: RUE deficits/detail RUE  Deficits / Details: R sided neglect, but used R arm for scooting in bed and pushing up to stand.    Lower Extremity Assessment Lower Extremity Assessment: RLE deficits/detail RLE Deficits / Details: R sided neglect, leaving R leg behind when moving to sit at EOB.  Able to stand on R leg without buckling.    Cervical / Trunk Assessment Cervical / Trunk Assessment:  (forward head/rounded shoulders)  Communication   Communication: Receptive difficulties;Expressive difficulties  Cognition Arousal/Alertness: Lethargic Behavior During Therapy: Flat affect Overall Cognitive Status: Impaired/Different from baseline Area of Impairment: Attention;Following commands;Safety/judgement;Awareness;Problem solving                   Current Attention Level: Selective   Following Commands: Follows one step commands inconsistently;Follows one step commands with increased time Safety/Judgement: Decreased awareness of safety;Decreased awareness of deficits Awareness: Intellectual Problem Solving: Slow processing;Decreased initiation;Difficulty sequencing;Requires verbal cues;Requires tactile cues General Comments: Pt responds to yes/no questions (shaking head or nodding), turns head to the L when her name is called, follows commands 25% of the time.      General Comments General comments (skin integrity, edema, etc.): Pt with very flat affect.  When asked if she is sad, she nodded yes.  Pt husband and daughter present this session.  Pt responded to husband's voice 25% of time.    Exercises     Assessment/Plan    PT Assessment Patient needs continued PT services  PT Problem List Decreased strength;Decreased activity tolerance;Decreased balance;Decreased mobility;Decreased coordination;Decreased cognition;Decreased knowledge of use of DME;Decreased safety awareness       PT Treatment Interventions DME instruction;Gait training;Stair training;Functional mobility training;Therapeutic  activities;Therapeutic exercise;Neuromuscular re-education;Balance training;Cognitive remediation;Patient/family education    PT Goals (Current goals can be found in the Care Plan section)  Acute Rehab PT Goals Patient Stated Goal: unable to state PT Goal Formulation: With family Time For Goal Achievement: 07/05/16 Potential to Achieve Goals: Fair    Frequency Min 4X/week   Barriers to discharge        Co-evaluation               End of Session Equipment Utilized During Treatment: Gait belt Activity Tolerance: Patient limited by lethargy Patient left: in bed;with bed alarm set;with family/visitor present;with call bell/phone within reach Nurse Communication: Other (comment) (Daughter requested to talk with RN) PT Visit Diagnosis: Other abnormalities of gait and mobility (R26.89);Other symptoms and signs involving the nervous system (R29.898)    Time: 2671-2458 PT Time Calculation (min) (ACUTE ONLY): 34 min   Charges:   PT Evaluation $PT Eval Moderate Complexity: 1 Procedure PT Treatments $Gait Training: 8-22 mins   PT G Codes:        Willaim Rayas SPT  Willaim Rayas 06/21/2016, 2:22 PM

## 2016-06-21 NOTE — Progress Notes (Signed)
Patient was seen and examined at bedside. She was admitted today morning. Please see H&P for detail.   56 year old female with history of dyslipidemia, hypertension, diabetes, on chronic Suboxone brought in by patient's husband for the evaluation of altered mental status including difficulty speaking. On arrival code stroke was activated and evaluated by neurologist.  Patient with new onset of aphasia suspecting ischemic infarction most likely thalamic aphasia as per neurologist.  MRI with no acute finding. Pending carotid ultrasound, echocardiogram LDL 165 A1c pending  During examination, patient was allowed awake but not speaking. She was following commands. At one point she was talking with her husband. Unknown if her symptoms could be contributed by psychiatric illness. 160/70, HR 93 NAD Alert awake and following commands. Mostly nonverbal Lungs clear bilateral Cardiovascular regular rate rhythm, S1-S2 normal Muscular strength 5 over 5 in all extremities No lower extremity edema.  Speech and swallow evaluation. If patient is not taking pills, change to IV metoprolol as needed for the management of hypertension and tachycardia. Aspirin orally or rectally On subutex. Change IVF to D5NS+KCL.  Follow up neurologist  DVT ppx: lovenox sq.

## 2016-06-21 NOTE — Consult Note (Addendum)
Neurology Consultation Reason for Consult: Altered Mental Status Referring Physician: Bebe Shaggy, D  CC: Difficulty speaking  History is obtained from:patient, husband  HPI: Bridget Johnston is a 56 y.o. female with a history of high cholesterol, hypertension, DM  Who presents with altered mental status that started around 9pm. Husband states that she was last normal around 8:45 pm. She then began having some confusion, and he noted that when he asked her what his name was, she was unable to answer him. This got him concerned, and when she was not getting better he brought her to the emergency department arriving just outside of the 4 1/2 hour window.  On arrival, could stroke was activated and she was found to have some aphasia, but no large vessel occlusion.  She was recently hospitalized in [place] with respiratory failure due to pneumonia. She was intubated and discharged on prednisone. Due to the prednisone, she had blood pressures in the 200s even as recently as Monday and Tuesday.  Of note, she had a similar episode on Wednesday that lasted for approximately 20 minutes. She saw her PCP who advised her to start a statin and aspirin but she has not yet started aspirin.  LKW: 8:45pm.  tpa given?: no, outside of window.    ROS: Unable to obtain due to altered mental status.   Past Medical History:  Diagnosis Date  . Arthritis   . Asthma   . Back pain   . Diabetes mellitus without complication (HCC)   . Finger pain   . Hand pain   . High triglycerides   . Leg pain      Family History  Problem Relation Age of Onset  . Heart disease Mother   . Hypertension Mother   . Heart disease Father   . Diabetes Father   . Hypertension Father   . Arthritis    . Diabetes    . Cancer - Colon Maternal Grandfather      Social History:  reports that she has been smoking Cigarettes and E-cigarettes.  She has a 17.00 pack-year smoking history. She has never used smokeless tobacco. She  reports that she does not drink alcohol or use drugs.   Exam: Current vital signs: BP (!) 150/81   Pulse 99   Resp 13   SpO2 97%  Vital signs in last 24 hours: Pulse Rate:  [99] 99 (04/15 0206) Resp:  [13-21] 13 (04/15 0206) BP: (150)/(81) 150/81 (04/15 0205) SpO2:  [97 %] 97 % (04/15 0206)   Physical Exam  Constitutional: Appears well-developed and well-nourished.  Psych: Affect appropriate to situation Eyes: No scleral injection HENT: No OP obstrucion Head: Normocephalic.  Cardiovascular: Normal rate and regular rhythm.  Respiratory: Effort normal and breath sounds normal to anterior ascultation GI: Soft.  No distension. There is no tenderness.  Skin: WDI  Neuro: Mental Status: Patient is awake, alert, oriented to person, place, month, year, and situation. Patient is able to give a clear and coherent history. No signs of  Neglect She is aphasic, making word substitutions as well as having some word finding difficulty. She is able to name a pen, but when asked what to do with that she says "I wake up" she has some speech perseveration, answering two repeatedly when checking visual fields. Cranial Nerves: II: She has at least some vision in both visual fields, difficult to check because perseveration and inconsistent answers, but she does appear to blink to threat laterally Pupils are equal, round, and reactive to  light.   III,IV, VI: EOMI without ptosis or diploplia.  V: Facial sensation is symmetric to temperature VII: Facial movement is symmetric.  VIII: hearing is intact to voice X: Uvula elevates symmetrically XI: Shoulder shrug is symmetric. XII: tongue is midline without atrophy or fasciculations.  Motor: Tone is normal. Bulk is normal. 5/5 strength was present in all four extremities.  Sensory: Sensation is symmetric to light touch and temperature in the arms and legs. Cerebellar: FNF and HKS are intact bilaterally   I have reviewed labs in epic and the  results pertinent to this consultation are: Normal creatinine  I have reviewed the images obtained: CTA-no LVO  Impression: 56 year old female with new onset aphasia. I suspect that she has a ischemic infarct, possibly with a thalamic aphasia.  Recommendations: 1. HgbA1c, fasting lipid panel 2. MRI of the brain without contrast 3. Frequent neuro checks 4. Echocardiogram 5. Carotid dopplers are not needed given CTA 6. Prophylactic therapy-Antiplatelet med: Aspirin - dose  PO or  PR 7. Risk factor modification 8. Telemetry monitoring 9. PT consult, OT consult, Speech consult 10. please page stroke NP  Or  PA  Or MD  from 8am -4 pm as this patient will be followed by the stroke team at this point.   You can look them up on [email or web address]      Ritta Slot, MD [facility] [phone]  If 7pm- 7am, please page neurology on call as listed in AMION.

## 2016-06-21 NOTE — ED Notes (Signed)
Patient transported to MRI 

## 2016-06-21 NOTE — H&P (Signed)
History and Physical    Bridget Johnston:[id number] DOB: [birth year] DOA: 06/21/2016  PCP: Tandy Gaw, PA-C   Patient coming from: Home  Chief Complaint: Trouble with speech    HPI: Bridget Johnston is a 56 y.o. female with medical history significant for hypertension, hyperlipidemia, depression with anxiety, and opiate dependence who presents to the emergency department with speech difficulty. Husband is at bedside and assists with the history. Patient had reportedly been doing well since hospital discharge on 06/09/2016 following treatment for pneumonia which required endotracheal intubation, but was then noted tonight to develop acute difficulty while playing a game with her grandchild at home. She was reportedly unable to count the play money used in the game, and then very slow to respond to questioning and seemingly confused. There had been no recent fall or head injury and no alcohol or illicit drug use. Patient was reportedly well at approximately 9 PM, and with her speech difficulty persisting, her husband eventually brought her into the emergency department for evaluation. Patient had not been complaining of any headache or change in vision or hearing, or any focal numbness or weakness. She had seemed to have an uneventful day leading up to this. There was no loss of consciousness or seizure-like activity. And the patient had never exhibited a similar episode previously.  ED Course: Upon arrival to the ED, patient is found to be afebrile, saturating adequately on room air, and with other vital signs stable. EKG features a sinus rhythm with sinus arrhythmia and noncontrast head CT is notable for mild age advanced atrophy but no acute intracranial abnormality. Chemistry panels notable for mild hyponatremia and hypochloremia and CBC is unremarkable. Ethanol level is undetectable, INR 0.9, and troponin is undetectable. Urinalysis and UDS remain pending. Patient was sent for CTA head and neck  which was a normal study. Neurology was consulted by the ED physician and advises a medical admission for suspected ischemic stroke. Patient has remained hemodynamically stable and has not been in any apparent respiratory distress. She will be admitted to the telemetry unit for ongoing evaluation and management of expressive aphasia suspected secondary to acute ischemic stroke.  Review of Systems:  All other systems reviewed and apart from HPI, are negative.  Past Medical History:  Diagnosis Date  . Arthritis   . Asthma   . Back pain   . Diabetes mellitus without complication (HCC)   . Finger pain   . Hand pain   . High triglycerides   . Leg pain     Past Surgical History:  Procedure Laterality Date  . ABLATION  2007  . BTL  1988  . NOVASURE ABLATION    . TONSILLECTOMY    . TUBAL LIGATION       reports that she has been smoking Cigarettes and E-cigarettes.  She has a 17.00 pack-year smoking history. She has never used smokeless tobacco. She reports that she does not drink alcohol or use drugs.  Allergies  Allergen Reactions  . Prednisone Hypertension    Family History  Problem Relation Age of Onset  . Heart disease Mother   . Hypertension Mother   . Heart disease Father   . Diabetes Father   . Hypertension Father   . Arthritis    . Diabetes    . Cancer - Colon Maternal Grandfather      Prior to Admission medications   Medication Sig Start Date End Date Taking? Authorizing Provider  albuterol (PROAIR HFA) 108 (90 Base) MCG/ACT inhaler  inhale 2 puffs INTO THE LUNGS every 4 hours if needed for wheezing or shortness of breath 05/08/16  Yes Jade L Breeback, PA-C  albuterol (PROVENTIL) (2.5 MG/3ML) 0.083% nebulizer solution Take 2.5 mg by nebulization every 6 (six) hours as needed for wheezing or shortness of breath.   Yes Historical Provider, MD  AMBULATORY NON FORMULARY MEDICATION Take 1 tablet by mouth daily. Patient reports taking wheat grass to help control her glucose.     Yes Historical Provider, MD  budesonide-formoterol (SYMBICORT) 160-4.5 MCG/ACT inhaler Inhale 2 puffs into the lungs 2 (two) times daily. 08/01/15  Yes Sean Hommel, DO  furosemide (LASIX) 20 MG tablet Take 1 tablet (20 mg total) by mouth daily as needed for fluid or edema. 10/31/15  Yes Sean Hommel, DO  gabapentin (NEURONTIN) 300 MG capsule Take 1 capsule (300 mg total) by mouth 3 (three) times daily. 04/20/16  Yes Jade L Breeback, PA-C  lisinopril (PRINIVIL,ZESTRIL) 10 MG tablet Take 1 tablet (10 mg total) by mouth daily. Patient taking differently: Take 5 mg by mouth daily.  06/12/16  Yes Carlis Stable, PA-C  metoprolol succinate (TOPROL-XL) 25 MG 24 hr tablet Take 25 mg by mouth daily.  06/09/16 06/09/17 Yes Historical Provider, MD  omega-3 acid ethyl esters (LOVAZA) 1 g capsule Take 2 capsules (2 g total) by mouth 2 (two) times daily. 10/31/15  Yes Sean Hommel, DO  SUBOXONE 8-2 MG FILM Take 0.5 Film by mouth 2 (two) times daily.  12/16/13  Yes Historical Provider, MD  Vilazodone HCl (VIIBRYD) 40 MG TABS Take 1 tablet (40 mg total) by mouth daily. 10/31/15  Yes Sean Hommel, DO  atorvastatin (LIPITOR) 40 MG tablet Take 1 tablet (40 mg total) by mouth daily. Patient not taking: Reported on 06/21/2016 06/19/16   Jomarie Longs, PA-C    Physical Exam: Vitals:   06/21/16 0300 06/21/16 0315 06/21/16 0330 06/21/16 0345  BP: (!) 141/71 (!) 153/75 (!) 153/69 (!) 146/81  Pulse: 96 85 82 (!) 102  Resp: 16 (!) 25 (!) 22 14  Temp:      SpO2: 96% 96% 97% 98%      Constitutional: No acute respiratory distress. Calm, comfortable Eyes: PERTLA, lids and conjunctivae normal ENMT: Mucous membranes are moist. Posterior pharynx clear of any exudate or lesions.   Neck: normal, supple, no masses, no thyromegaly Respiratory: Coarse rales bilaterally and expiratory wheeze. Normal respiratory effort. No accessory muscle use.  Cardiovascular: S1 & S2 heard, regular rate and rhythm. No extremity edema. No  significant JVD. Abdomen: No distension, no tenderness, no masses palpated. Bowel sounds normal.  Musculoskeletal: no clubbing / cyanosis. No joint deformity upper and lower extremities. Normal muscle tone.  Skin: no significant rashes, lesions, ulcers. Warm, dry, well-perfused. Neurologic: CN 2-12 grossly intact. Sensation intact, DTR normal. Strength 5/5 in all 4 limbs. Speech fluent, but intermittently nonsensical. Follows commands appropriately.  Psychiatric: Difficult to assess in setting of aphasia. Seems frustrated by speech difficulty.      Labs on Admission: I have personally reviewed following labs and imaging studies  CBC:  Recent Labs Lab 06/21/16 0134 06/21/16 0150  WBC 7.2  --   NEUTROABS 5.3  --   HGB 12.6 12.2  HCT 36.3 36.0  MCV 92.8  --   PLT 270  --    Basic Metabolic Panel:  Recent Labs Lab 06/21/16 0134 06/21/16 0150  NA 134* 134*  K 3.7 3.7  CL 99* 98*  CO2 26  --   GLUCOSE  150* 146*  BUN 17 18  CREATININE 0.94 0.90  CALCIUM 9.2  --    GFR: Estimated Creatinine Clearance: 78.2 mL/min (by C-G formula based on SCr of 0.9 mg/dL). Liver Function Tests:  Recent Labs Lab 06/21/16 0134  AST 21  ALT 33  ALKPHOS 50  BILITOT 0.7  PROT 6.5  ALBUMIN 3.9   No results for input(s): LIPASE, AMYLASE in the last 168 hours. No results for input(s): AMMONIA in the last 168 hours. Coagulation Profile:  Recent Labs Lab 06/21/16 0134  INR 0.87   Cardiac Enzymes: No results for input(s): CKTOTAL, CKMB, CKMBINDEX, TROPONINI in the last 168 hours. BNP (last 3 results) No results for input(s): PROBNP in the last 8760 hours. HbA1C: No results for input(s): HGBA1C in the last 72 hours. CBG: No results for input(s): GLUCAP in the last 168 hours. Lipid Profile: No results for input(s): CHOL, HDL, LDLCALC, TRIG, CHOLHDL, LDLDIRECT in the last 72 hours. Thyroid Function Tests: No results for input(s): TSH, T4TOTAL, FREET4, T3FREE, THYROIDAB in the last  72 hours. Anemia Panel: No results for input(s): VITAMINB12, FOLATE, FERRITIN, TIBC, IRON, RETICCTPCT in the last 72 hours. Urine analysis:    Component Value Date/Time   COLORURINE dark yellow 08/25/2010 1715   APPEARANCEUR Cloudy 10/08/2010 0815   LABSPEC >=1.030 10/08/2010 0815   PHURINE 5.5 10/08/2010 0815   HGBUR 1+ 10/08/2010 0815   BILIRUBINUR neg 02/07/2016 0827   PROTEINUR neg 02/07/2016 0827   UROBILINOGEN 0.2 02/07/2016 0827   UROBILINOGEN 0.2 10/08/2010 0815   NITRITE neg 02/07/2016 0827   NITRITE negative 10/08/2010 0815   LEUKOCYTESUR small (1+) (A) 02/07/2016 0827   Sepsis Labs: (procalcitonin:4,lacticidven:4) )No results found for this or any previous visit (from the past 240 hour(s)).   Radiological Exams on Admission: Ct Angio Head W Or Wo Contrast  Result Date: 06/21/2016 CLINICAL DATA:  Confusion with altered mental status. EXAM: CT ANGIOGRAPHY HEAD AND NECK TECHNIQUE: Multidetector CT imaging of the head and neck was performed using the standard protocol during bolus administration of intravenous contrast. Multiplanar CT image reconstructions and MIPs were obtained to evaluate the vascular anatomy. Carotid stenosis measurements (when applicable) are obtained utilizing NASCET criteria, using the distal internal carotid diameter as the denominator. CONTRAST:  50 mL Isovue 370 COMPARISON:  Concomitant head CT. FINDINGS: CTA NECK FINDINGS Aortic arch: There is no aneurysm or dissection of the visualized ascending aorta or aortic arch. There is a normal 3 vessel branching pattern. The visualized proximal subclavian arteries are normal. Right carotid system: The right common carotid origin is widely patent. There is no common carotid or internal carotid artery dissection or aneurysm. No hemodynamically significant stenosis. Left carotid system: The left common carotid origin is widely patent. There is no common carotid or internal carotid artery dissection or  aneurysm. No hemodynamically significant stenosis. Vertebral arteries: The vertebral system is left dominant. Both vertebral artery origins are normal. Both vertebral arteries are normal to their confluence with the basilar artery. Skeleton: There is no bony spinal canal stenosis. No lytic or blastic lesions. Other neck: The nasopharynx is clear. The oropharynx and hypopharynx are normal. The epiglottis is normal. The supraglottic larynx, glottis and subglottic larynx are normal. No retropharyngeal collection. The parapharyngeal spaces are preserved. The parotid and submandibular glands are normal. No sialolithiasis or salivary ductal dilatation. The thyroid gland is normal. There is no cervical lymphadenopathy. Upper chest: No pneumothorax or pleural effusion. No nodules or masses. Review of the MIP images confirms the above  findings CTA HEAD FINDINGS Anterior circulation: --Intracranial internal carotid arteries: Normal. --Anterior cerebral arteries: There is an absent right A1 segment, a normal congenital variant. The left ACA is normal. --Middle cerebral arteries: Normal. --Posterior communicating arteries: Absent bilaterally. Posterior circulation: --Posterior cerebral arteries: Normal. --Superior cerebellar arteries: Normal. --Basilar artery: Normal. --Anterior inferior cerebellar arteries: Normal. --Posterior inferior cerebellar arteries: Normal. Venous sinuses: As permitted by contrast timing, patent. Anatomic variants: Absent right anterior cerebral artery A1 segment Delayed phase:  Not performed. Review of the MIP images confirms the above findings IMPRESSION: Normal CTA of the head and neck. Electronically Signed   By: Deatra Robinson M.D.   On: 06/21/2016 02:44   Ct Angio Neck W Or Wo Contrast  Result Date: 06/21/2016 CLINICAL DATA:  Confusion with altered mental status. EXAM: CT ANGIOGRAPHY HEAD AND NECK TECHNIQUE: Multidetector CT imaging of the head and neck was performed using the standard protocol  during bolus administration of intravenous contrast. Multiplanar CT image reconstructions and MIPs were obtained to evaluate the vascular anatomy. Carotid stenosis measurements (when applicable) are obtained utilizing NASCET criteria, using the distal internal carotid diameter as the denominator. CONTRAST:  50 mL Isovue 370 COMPARISON:  Concomitant head CT. FINDINGS: CTA NECK FINDINGS Aortic arch: There is no aneurysm or dissection of the visualized ascending aorta or aortic arch. There is a normal 3 vessel branching pattern. The visualized proximal subclavian arteries are normal. Right carotid system: The right common carotid origin is widely patent. There is no common carotid or internal carotid artery dissection or aneurysm. No hemodynamically significant stenosis. Left carotid system: The left common carotid origin is widely patent. There is no common carotid or internal carotid artery dissection or aneurysm. No hemodynamically significant stenosis. Vertebral arteries: The vertebral system is left dominant. Both vertebral artery origins are normal. Both vertebral arteries are normal to their confluence with the basilar artery. Skeleton: There is no bony spinal canal stenosis. No lytic or blastic lesions. Other neck: The nasopharynx is clear. The oropharynx and hypopharynx are normal. The epiglottis is normal. The supraglottic larynx, glottis and subglottic larynx are normal. No retropharyngeal collection. The parapharyngeal spaces are preserved. The parotid and submandibular glands are normal. No sialolithiasis or salivary ductal dilatation. The thyroid gland is normal. There is no cervical lymphadenopathy. Upper chest: No pneumothorax or pleural effusion. No nodules or masses. Review of the MIP images confirms the above findings CTA HEAD FINDINGS Anterior circulation: --Intracranial internal carotid arteries: Normal. --Anterior cerebral arteries: There is an absent right A1 segment, a normal congenital variant.  The left ACA is normal. --Middle cerebral arteries: Normal. --Posterior communicating arteries: Absent bilaterally. Posterior circulation: --Posterior cerebral arteries: Normal. --Superior cerebellar arteries: Normal. --Basilar artery: Normal. --Anterior inferior cerebellar arteries: Normal. --Posterior inferior cerebellar arteries: Normal. Venous sinuses: As permitted by contrast timing, patent. Anatomic variants: Absent right anterior cerebral artery A1 segment Delayed phase:  Not performed. Review of the MIP images confirms the above findings IMPRESSION: Normal CTA of the head and neck. Electronically Signed   By: Deatra Robinson M.D.   On: 06/21/2016 02:44   Ct Head Code Stroke W/o Cm  Result Date: 06/21/2016 CLINICAL DATA:  Code stroke.  Confusion EXAM: CT HEAD WITHOUT CONTRAST TECHNIQUE: Contiguous axial images were obtained from the base of the skull through the vertex without intravenous contrast. COMPARISON:  None. FINDINGS: Brain: No mass lesion, intraparenchymal hemorrhage or extra-axial collection. No evidence of acute cortical infarct. There is periventricular hypoattenuation compatible with chronic microvascular disease. Mildly advanced atrophy for  age. Vascular: No hyperdense vessel or unexpected calcification. Skull: Normal visualized skull base, calvarium and extracranial soft tissues. Sinuses/Orbits: Right maxillary sinus fluid level. Normal orbits. ASPECTS [facility] Stroke Program Early CT Score) - Ganglionic level infarction (caudate, lentiform nuclei, internal capsule, insula, M1-M3 cortex): 7 - Supraganglionic infarction (M4-M6 cortex): 3 Total score (0-10 with 10 being normal): 10 IMPRESSION: 1. Mild age advanced atrophy without acute intracranial abnormality. 2. Fluid level in the right maxillary sinus. Correlate clinically for signs of acute sinusitis. 3. ASPECTS is 10. These results were called by telephone at the time of interpretation on 06/21/2016 at 2:01 am to Dr. Ritta Slot,  who verbally acknowledged these results. Electronically Signed   By: Deatra Robinson M.D.   On: 06/21/2016 02:02    EKG: Independently reviewed. Sinus rhythm with sinus arrhythmia.  Assessment/Plan  1. Acute expressive aphasia, suspected ischemic CVA - Pt presents with acute-onset of aphasia, presenting outside the window for consideration of tPA  - Neurology is consulting and much appreciated  - Head CT notable for mild age-advanced atrophy, but no acute intracranial abnormality - CTA head and neck is a normal study  - Plan to monitor on telemetry with frequent neuro checks, PT, OT, and SLP evals  - Obtain MRI brain, echo, fasting lipids and A1c  - Start prophylactic ASA, continue statin  - Control sugars, permit HTN for now, maintain normothermia and euvolemia  2. Hypertension  - BP mildly elevated on presentation  - Managed at home with Toprol and lisinopril; will hold for now  - Labetalol IVP's prn SBP >210 or DBP >110 in acute phase ischemic CVA    3. Asthma  - No apparent respiratory distress, but rhonchi and occasional wheeze noted on exam  - Recently admitted to ICU with PNA, requiring intubation  - Continue scheduled ICS/LABA with Dulera, continue prn albuterol nebs   4. Depression with anxiety  - Difficult to assess given the aphasia  - Resume vilazodone once she is appropriate for oral meds   5. Opiate dependence  - Continue buprenorphine with bowel regimen   6. Chronic systolic CHF  - Appears euvolemic on admission  - Uses Lasix 20 mg prn edema at home; held on admission  - Follow daily wts and I/O's  - Echo pending as above   DVT prophylaxis: sq Lovenox  Code Status: Full  Family Communication: Husband updated at bedside Disposition Plan: Admit to telemetry Consults called: Neurology Admission status: Inpatient    Briscoe Deutscher, MD [facility] Pager [phone]  If 7PM-7AM, please contact night-coverage [email or web address] Password  [facility]  06/21/2016, 3:55 AM

## 2016-06-21 NOTE — Progress Notes (Signed)
Unable to administer medication by mouth. Pt will not open her mouth shook her head from side to side then stated, " No." Husband at bedside assisted with encouragement but pt continued to refuse. Md text paged.

## 2016-06-21 NOTE — Evaluation (Addendum)
Occupational Therapy Evaluation Patient Details Name: Bridget Johnston MRN: [id number] DOB: [birth year] Today's Date: 06/21/2016    History of Present Illness Pt is a 56 y.o. female who presented to the ED with speech difficulty. She was recently admitted to [facility] with pneumonia requiring endotracheal intubation. The evening of arrival to ED, pt's husband noticed confusion while playing a game and increasing speech difficulty with slow responses. She has a PMH significant for hypertension, hyperlipidemia, depression with anxiety, and opiate dependence.    Clinical Impression   PTA, pt was independent with basic ADL and had supervision daily from family. Pt highly lethargic on evaluation and required multimodal cues to attend to task demonstrating ability to intermittently follow one-step commands. She currently requires overall max-total assist for ADL tasks. Pt would benefit from continued OT services while admitted to improve independence with ADL and functional mobility. She demonstrates significant functional decline from PLOF and would benefit from CIR placement for continued rehabilitation in order to maximize independence. Will continue to follow acutely.    Follow Up Recommendations  CIR;Supervision/Assistance - 24 hour    Equipment Recommendations  Other (comment) (TBD at next venue of care)    Recommendations for Other Services       Precautions / Restrictions Precautions Precautions: Fall Restrictions Weight Bearing Restrictions: No      Mobility Bed Mobility Overal bed mobility: Needs Assistance Bed Mobility: Rolling Rolling: Max assist         General bed mobility comments: Able to initiate rolling to L side with max assist for completion. To R, pt required total assist.  Transfers                 General transfer comment: Unsafe to attempt this session    Balance                                           ADL either performed or  assessed with clinical judgement   ADL Overall ADL's : Needs assistance/impaired     Grooming: Maximal assistance;Bed level Grooming Details (indicate cue type and reason): Pt able to grasp washcloth but did not initiate hand to face movements despite hand over hand assistance.[name] Kitchen  Upper Body Bathing: Maximal assistance;Bed level   Lower Body Bathing: Maximal assistance;Bed level   Upper Body Dressing : Maximal assistance;Bed level   Lower Body Dressing: Maximal assistance;Bed level       Toileting- Clothing Manipulation and Hygiene: Maximal assistance;Bed level         General ADL Comments: Pt lethargic and with difficulty following commands. Able to      Vision Baseline Vision/History: Wears glasses Wears Glasses: At all times Patient Visual Report:  (UNable to state) Vision Assessment?: Vision impaired- to be further tested in functional context Additional Comments: Pt with right gaze and able to coss midline with multimodal cues     Perception     Praxis      Pertinent Vitals/Pain Pain Assessment: Faces Faces Pain Scale: Hurts little more Pain Location: Shook head "yes" when asking if moving her shoulder during rolling hurt. Unable to report number on pain scale. Pain Intervention(s): Monitored during session;Limited activity within patient's tolerance;Repositioned     Hand Dominance Right   Extremity/Trunk Assessment Upper Extremity Assessment Upper Extremity Assessment: RUE deficits/detail;LUE deficits/detail RUE Deficits / Details: Overall 3/5 strength in shoulders and at elbow. Indicated that she could  feel light touch with a nod. Able to demonstrate 4/5 grasp strength.  RUE Coordination: decreased fine motor;decreased gross motor LUE Deficits / Details: Overall 3+/5 strength in L UE and 4+/5 grasp. LUE Coordination: decreased fine motor;decreased gross motor   Lower Extremity Assessment Lower Extremity Assessment: Defer to PT evaluation        Communication Communication Communication: Other (comment);Receptive difficulties;Expressive difficulties (Lethargic and not following commands)   Cognition Arousal/Alertness: Lethargic;Suspect due to medications   Overall Cognitive Status: Impaired/Different from baseline Area of Impairment: Attention;Following commands;Problem solving                   Current Attention Level: Selective   Following Commands: Follows one step commands inconsistently     Problem Solving: Slow processing;Decreased initiation;Difficulty sequencing;Requires verbal cues;Requires tactile cues General Comments: Pt lethargic and only intermittently following commands with multimodal cues. Pt at times verbalizes "yes" and frequently nods hod to questions.    General Comments       Exercises     Shoulder Instructions      Home Living Family/patient expects to be discharged to:: Private residence Living Arrangements: Spouse/significant other Available Help at Discharge: Family;Available 24 hours/day Type of Home: House Home Access: Stairs to enter [employer] of Steps: 1 Entrance Stairs-Rails: None Home Layout: One level         Firefighter: Standard     Home Equipment: None          Prior Functioning/Environment Level of Independence: Independent        Comments: Independent with all. 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        OT Problem List: Decreased strength;Decreased range of motion;Decreased activity tolerance;Impaired balance (sitting and/or standing);Impaired vision/perception;Decreased coordination;Decreased cognition;Decreased safety awareness;Decreased knowledge of use of DME or AE;Decreased knowledge of precautions;Impaired UE functional use;Pain      OT Treatment/Interventions: Self-care/ADL training;Therapeutic exercise;Energy conservation;DME and/or AE instruction;Therapeutic activities;Visual/perceptual  remediation/compensation;Patient/family education;Cognitive remediation/compensation;Balance training    OT Goals(Current goals can be found in the care plan section) Acute Rehab OT Goals Patient Stated Goal: unable to state OT Goal Formulation: With patient/family Time For Goal Achievement: 07/05/16 Potential to Achieve Goals: Good ADL Goals Pt Will Perform Grooming: sitting;with supervision Pt Will Perform Upper Body Dressing: with supervision;sitting Pt Will Perform Lower Body Dressing: with min assist;sit to/from stand Pt Will Transfer to Toilet: stand pivot transfer;bedside commode;with min guard assist Pt/caregiver will Perform Home Exercise Program: Increased strength;Right Upper extremity;With Supervision;With written HEP provided Additional ADL Goal #1: Pt will complete bed mobility in preparation for ADL seated at EOB with overall supervision.  OT Frequency: Min 2X/week   Barriers to D/C:            Co-evaluation              End of Session Nurse Communication: Mobility status  Activity Tolerance: Patient tolerated treatment well Patient left: in bed;with call bell/phone within reach;with family/visitor present  OT Visit Diagnosis: Muscle weakness (generalized) (M62.81);Hemiplegia and hemiparesis;Cognitive communication deficit (R41.841) Hemiplegia - Right/Left: Right Hemiplegia - dominant/non-dominant: Dominant Hemiplegia - caused by: Unspecified                Time: 9147-8295 OT Time Calculation (min): 34 min Charges:  OT General Charges $OT Visit: 1 Procedure OT Evaluation $OT Eval Moderate Complexity: 1 Procedure OT Treatments $Self Care/Home Management : 8-22 mins G-Codes:     Doristine Section, MS OTR/L  Pager: [phone]   Burnett Lieber A Zuha Dejonge  06/21/2016, 11:11 AM

## 2016-06-21 NOTE — ED Provider Notes (Signed)
[facility]-EMERGENCY DEPT Provider Note   CSN: [id number] Arrival date & time: 06/21/16  0117   By signing my name below, I, Bridget Johnston, attest that this documentation has been prepared under the direction and in the presence of Bridget Rhine, MD. Electronically Signed: Soijett Johnston, ED Scribe. 06/21/16. 2:15 AM.  History   Chief Complaint Chief Complaint  Patient presents with  . Code Stroke   LEVEL 5 CAVEAT: ACUITY OF CONDITION  HPI Bridget Johnston is a 56 y.o. female with a PMHx of DM who presents to the Emergency Department complaining of difficulty speaking onset tonight PTA. Husband states that the pt was having difficulty counting money with their grandson this evening PTA. Husband also reports that the pt is having difficulty with following commands. Pt husband notes that the pt is unable to state her full name or date of birth and that is abnormal for her. Husband states that the pt was last at her baseline on 2045 tonight. Husband notes that the pt was not given any medications for the relief of her symptoms. Husband denies the  Pt having any other symptoms.  Pt with recent admission to [facility] system for asthma/pneumonia and was apparently intubated at that time.  The history is provided by the patient and the spouse. No language interpreter was used.    Past Medical History:  Diagnosis Date  . Arthritis   . Asthma   . Back pain   . Diabetes mellitus without complication (HCC)   . Finger pain   . Hand pain   . High triglycerides   . Leg pain     Patient Active Problem List   Diagnosis Date Noted  . Hypotension due to drugs 06/19/2016  . Acute respiratory failure with hypoxia (HCC) 06/19/2016  . Acute systolic heart failure (HCC) 06/09/2016  . Hyperlipidemia 07/23/2014  . Hyperglycemia 07/23/2014  . Vitamin D deficiency 07/23/2014  . Elevated liver enzymes 07/23/2014  . Anxiety and depression 07/23/2014  . Fibromyalgia 07/23/2014  . Opiate addiction (HCC)  01/03/2014  . Asthma 01/03/2014  . 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 HYPERLIPIDEMIA 08/25/2010    Past Surgical History:  Procedure Laterality Date  . ABLATION  2007  . BTL  1988  . NOVASURE ABLATION    . TONSILLECTOMY    . TUBAL LIGATION      OB History    Gravida Para Term Preterm AB Living   SAB TAB Ectopic Multiple Live Births                   Home Medications    Prior to Admission medications   Medication Sig Start Date End Date Taking? Authorizing Provider  albuterol (PROAIR HFA) 108 (90 Base) MCG/ACT inhaler inhale 2 puffs INTO THE LUNGS every 4 hours if needed for wheezing or shortness of breath 05/08/16   Jade L Breeback, PA-C  AMBULATORY NON FORMULARY MEDICATION Patient reports taking wheat grass to help control her glucose.    Historical Provider, MD  atorvastatin (LIPITOR) 40 MG tablet Take 1 tablet (40 mg total) by mouth daily. 06/19/16   Jade L Breeback, PA-C  budesonide-formoterol (SYMBICORT) 160-4.5 MCG/ACT inhaler Inhale 2 puffs into the lungs 2 (two) times daily. 08/01/15   Sean Hommel, DO  furosemide (LASIX) 20 MG tablet Take 1 tablet (20 mg total) by mouth daily as needed for fluid or edema. 10/31/15   Laren Boom, DO  gabapentin (NEURONTIN) 300 MG capsule Take 1 capsule (300 mg total) by mouth 3 (three) times daily. 04/20/16   Jade L Breeback, PA-C  lisinopril (PRINIVIL,ZESTRIL) 10 MG tablet Take 1 tablet (10 mg total) by mouth daily. 06/12/16   Carlis Stable, PA-C  metoprolol succinate (TOPROL-XL) 25 MG 24 hr tablet Take by mouth. 06/09/16 06/09/17  Historical Provider, MD  omega-3 acid ethyl esters (LOVAZA) 1 g capsule Take 2 capsules (2 g total) by mouth 2 (two) times daily. 10/31/15   Laren Boom, DO  SUBOXONE 8-2 MG FILM  12/16/13   Historical Provider, MD  Vilazodone HCl (VIIBRYD) 40 MG TABS Take 1 tablet (40 mg total) by mouth daily. 10/31/15   Laren Boom, DO    Family History Family History  Problem  Relation Age of Onset  . Heart disease Mother   . Hypertension Mother   . Heart disease Father   . Diabetes Father   . Hypertension Father   . Arthritis    . Diabetes    . Cancer - Colon Maternal Grandfather     Social History Social History  Substance Use Topics  . Smoking status: Current Every Day Smoker    Packs/day: 1.00    Years: 17.00    Types: Cigarettes, E-cigarettes    Last attempt to quit: 01/06/2012  . Smokeless tobacco: Never Used  . Alcohol use No     Allergies   Prednisone   Review of Systems Review of Systems  Unable to perform ROS: Acuity of condition     Physical Exam Updated Vital Signs BP (!) 147/80   Pulse 100   Temp 98.1 F (36.7 C)   Resp 19   SpO2 97%   Physical Exam   CONSTITUTIONAL: Well developed/well nourished HEAD: Normocephalic/atraumatic EYES: EOMI/PERRL, no nystagmus ENMT: Mucous membranes moist NECK: supple no meningeal signs SPINE/BACK:entire spine nontender CV: S1/S2 noted, no murmurs/rubs/gallops noted LUNGS: Lungs are clear to auscultation bilaterally, no apparent distress ABDOMEN: soft, nontender, no rebound or guarding, bowel sounds noted throughout abdomen GU:no cva tenderness NEURO: Pt is awake/alert, moves all extremitiesx4.  No facial droop.  No arm/leg drift.  Expressive Aphasia noted EXTREMITIES: pulses normal/equal, full ROM SKIN: warm, color normal PSYCH: no abnormalities of mood noted, alert and oriented to situation   ED Treatments / Results  DIAGNOSTIC STUDIES: Oxygen Saturation is 97% on RA, nl by my interpretation.    COORDINATION OF CARE: 2:09 AM Discussed treatment plan with pt family at bedside and pt family agreed to plan.   Labs (all labs ordered are listed, but only abnormal results are displayed) Labs Reviewed  COMPREHENSIVE METABOLIC PANEL - Abnormal; Notable for the following:       Result Value   Sodium 134 (*)    Chloride 99 (*)    Glucose, Bld 150 (*)    All other components  within normal limits  I-STAT CHEM 8, ED - Abnormal; Notable for the following:    Sodium 134 (*)    Chloride 98 (*)    Glucose, Bld 146 (*)    Calcium, Ion 1.10 (*)    All other components within normal limits  ETHANOL  PROTIME-INR  APTT  CBC  DIFFERENTIAL  RAPID URINE DRUG SCREEN, HOSP PERFORMED  URINALYSIS, ROUTINE W REFLEX MICROSCOPIC  I-STAT TROPOININ, ED    EKG  EKG Interpretation  Date/Time:  Sunday June 21 2016 01:30:09 EDT Ventricular Rate:  87 PR Interval:  150 QRS Duration: 82 QT Interval:  380 QTC Calculation:  457 R Axis:   69 Text Interpretation:  Sinus rhythm with marked sinus arrhythmia Cannot rule out Anterior infarct , age undetermined Abnormal ECG No previous ECGs available Confirmed by Bebe Shaggy  MD, Dorethy Tomey ([id number]) on 06/21/2016 2:00:13 AM       Radiology Ct Angio Head W Or Wo Contrast  Result Date: 06/21/2016 CLINICAL DATA:  Confusion with altered mental status. EXAM: CT ANGIOGRAPHY HEAD AND NECK TECHNIQUE: Multidetector CT imaging of the head and neck was performed using the standard protocol during bolus administration of intravenous contrast. Multiplanar CT image reconstructions and MIPs were obtained to evaluate the vascular anatomy. Carotid stenosis measurements (when applicable) are obtained utilizing NASCET criteria, using the distal internal carotid diameter as the denominator. CONTRAST:  50 mL Isovue 370 COMPARISON:  Concomitant head CT. FINDINGS: CTA NECK FINDINGS Aortic arch: There is no aneurysm or dissection of the visualized ascending aorta or aortic arch. There is a normal 3 vessel branching pattern. The visualized proximal subclavian arteries are normal. Right carotid system: The right common carotid origin is widely patent. There is no common carotid or internal carotid artery dissection or aneurysm. No hemodynamically significant stenosis. Left carotid system: The left common carotid origin is widely patent. There is no common carotid or internal  carotid artery dissection or aneurysm. No hemodynamically significant stenosis. Vertebral arteries: The vertebral system is left dominant. Both vertebral artery origins are normal. Both vertebral arteries are normal to their confluence with the basilar artery. Skeleton: There is no bony spinal canal stenosis. No lytic or blastic lesions. Other neck: The nasopharynx is clear. The oropharynx and hypopharynx are normal. The epiglottis is normal. The supraglottic larynx, glottis and subglottic larynx are normal. No retropharyngeal collection. The parapharyngeal spaces are preserved. The parotid and submandibular glands are normal. No sialolithiasis or salivary ductal dilatation. The thyroid gland is normal. There is no cervical lymphadenopathy. Upper chest: No pneumothorax or pleural effusion. No nodules or masses. Review of the MIP images confirms the above findings CTA HEAD FINDINGS Anterior circulation: --Intracranial internal carotid arteries: Normal. --Anterior cerebral arteries: There is an absent right A1 segment, a normal congenital variant. The left ACA is normal. --Middle cerebral arteries: Normal. --Posterior communicating arteries: Absent bilaterally. Posterior circulation: --Posterior cerebral arteries: Normal. --Superior cerebellar arteries: Normal. --Basilar artery: Normal. --Anterior inferior cerebellar arteries: Normal. --Posterior inferior cerebellar arteries: Normal. Venous sinuses: As permitted by contrast timing, patent. Anatomic variants: Absent right anterior cerebral artery A1 segment Delayed phase:  Not performed. Review of the MIP images confirms the above findings IMPRESSION: Normal CTA of the head and neck. Electronically Signed   By: Deatra Robinson M.D.   On: 06/21/2016 02:44   Ct Angio Neck W Or Wo Contrast  Result Date: 06/21/2016 CLINICAL DATA:  Confusion with altered mental status. EXAM: CT ANGIOGRAPHY HEAD AND NECK TECHNIQUE: Multidetector CT imaging of the head and neck was  performed using the standard protocol during bolus administration of intravenous contrast. Multiplanar CT image reconstructions and MIPs were obtained to evaluate the vascular anatomy. Carotid stenosis measurements (when applicable) are obtained utilizing NASCET criteria, using the distal internal carotid diameter as the denominator. CONTRAST:  50 mL Isovue 370 COMPARISON:  Concomitant head CT. FINDINGS: CTA NECK FINDINGS Aortic arch: There is no aneurysm or dissection of the visualized ascending aorta or aortic arch. There is a normal 3 vessel branching pattern. The visualized proximal subclavian arteries are normal. Right carotid system: The right common carotid origin is widely patent. There is no common carotid or internal  carotid artery dissection or aneurysm. No hemodynamically significant stenosis. Left carotid system: The left common carotid origin is widely patent. There is no common carotid or internal carotid artery dissection or aneurysm. No hemodynamically significant stenosis. Vertebral arteries: The vertebral system is left dominant. Both vertebral artery origins are normal. Both vertebral arteries are normal to their confluence with the basilar artery. Skeleton: There is no bony spinal canal stenosis. No lytic or blastic lesions. Other neck: The nasopharynx is clear. The oropharynx and hypopharynx are normal. The epiglottis is normal. The supraglottic larynx, glottis and subglottic larynx are normal. No retropharyngeal collection. The parapharyngeal spaces are preserved. The parotid and submandibular glands are normal. No sialolithiasis or salivary ductal dilatation. The thyroid gland is normal. There is no cervical lymphadenopathy. Upper chest: No pneumothorax or pleural effusion. No nodules or masses. Review of the MIP images confirms the above findings CTA HEAD FINDINGS Anterior circulation: --Intracranial internal carotid arteries: Normal. --Anterior cerebral arteries: There is an absent right A1  segment, a normal congenital variant. The left ACA is normal. --Middle cerebral arteries: Normal. --Posterior communicating arteries: Absent bilaterally. Posterior circulation: --Posterior cerebral arteries: Normal. --Superior cerebellar arteries: Normal. --Basilar artery: Normal. --Anterior inferior cerebellar arteries: Normal. --Posterior inferior cerebellar arteries: Normal. Venous sinuses: As permitted by contrast timing, patent. Anatomic variants: Absent right anterior cerebral artery A1 segment Delayed phase:  Not performed. Review of the MIP images confirms the above findings IMPRESSION: Normal CTA of the head and neck. Electronically Signed   By: Deatra Robinson M.D.   On: 06/21/2016 02:44   Ct Head Code Stroke W/o Cm  Result Date: 06/21/2016 CLINICAL DATA:  Code stroke.  Confusion EXAM: CT HEAD WITHOUT CONTRAST TECHNIQUE: Contiguous axial images were obtained from the base of the skull through the vertex without intravenous contrast. COMPARISON:  None. FINDINGS: Brain: No mass lesion, intraparenchymal hemorrhage or extra-axial collection. No evidence of acute cortical infarct. There is periventricular hypoattenuation compatible with chronic microvascular disease. Mildly advanced atrophy for age. Vascular: No hyperdense vessel or unexpected calcification. Skull: Normal visualized skull base, calvarium and extracranial soft tissues. Sinuses/Orbits: Right maxillary sinus fluid level. Normal orbits. ASPECTS [facility] Stroke Program Early CT Score) - Ganglionic level infarction (caudate, lentiform nuclei, internal capsule, insula, M1-M3 cortex): 7 - Supraganglionic infarction (M4-M6 cortex): 3 Total score (0-10 with 10 being normal): 10 IMPRESSION: 1. Mild age advanced atrophy without acute intracranial abnormality. 2. Fluid level in the right maxillary sinus. Correlate clinically for signs of acute sinusitis. 3. ASPECTS is 10. These results were called by telephone at the time of interpretation on 06/21/2016 at  2:01 am to Dr. Ritta Slot, who verbally acknowledged these results. Electronically Signed   By: Deatra Robinson M.D.   On: 06/21/2016 02:02    Procedures Procedures (including critical care time)  Medications Ordered in ED Medications  iopamidol (ISOVUE-370) 76 % injection (not administered)     Initial Impression / Assessment and Plan / ED Course  I have reviewed the triage vital signs and the nursing notes.  Pertinent labs & imaging results that were available during my care of the patient were reviewed by me and considered in my medical decision making (see chart for details).     2:47 AM Pt seen by neurology dr Amada Jupiter tPA in stroke considered but not given due to: Onset over 3-4.5hours  He requests to admit patient to medicine for stroke workup  3:35 AM D/w dr opyd for admission Pt stable, no new issues at this time  Final  Clinical Impressions(s) / ED Diagnoses   Final diagnoses:  Stroke (cerebrum) (HCC)  Stroke (cerebrum) (HCC)    New Prescriptions New Prescriptions   No medications on file   I personally performed the services described in this documentation, which was scribed in my presence. The recorded information has been reviewed and is accurate.        Bridget Rhine, MD 06/21/16 [phone]

## 2016-06-21 NOTE — ED Triage Notes (Signed)
Pt presents to ED with husband.  Patient altered, unable to tell [place] her full name, or date of birth.  Patient thinking long and hard about answers, but not coming up with correct answers.  LKW 9:00pm.  Patient's husband states she was having difficulty counting play money with her grandson at home, and then unable to follow his commands.   Patient deteriorating during assessment.

## 2016-06-21 NOTE — Progress Notes (Signed)
Patient admitted from ER. Patient alert but confused. Tele placed and was verified. Patient and family oriented to room. Will continue to monitor.

## 2016-06-22 ENCOUNTER — Inpatient Hospital Stay (HOSPITAL_COMMUNITY): Payer: 59

## 2016-06-22 ENCOUNTER — Encounter (HOSPITAL_COMMUNITY): Payer: Self-pay | Admitting: *Deleted

## 2016-06-22 DIAGNOSIS — E1169 Type 2 diabetes mellitus with other specified complication: Secondary | ICD-10-CM

## 2016-06-22 DIAGNOSIS — E871 Hypo-osmolality and hyponatremia: Secondary | ICD-10-CM

## 2016-06-22 DIAGNOSIS — E669 Obesity, unspecified: Secondary | ICD-10-CM

## 2016-06-22 DIAGNOSIS — R4702 Dysphasia: Secondary | ICD-10-CM

## 2016-06-22 DIAGNOSIS — F1121 Opioid dependence, in remission: Secondary | ICD-10-CM

## 2016-06-22 DIAGNOSIS — F329 Major depressive disorder, single episode, unspecified: Secondary | ICD-10-CM

## 2016-06-22 DIAGNOSIS — I10 Essential (primary) hypertension: Secondary | ICD-10-CM

## 2016-06-22 DIAGNOSIS — E782 Mixed hyperlipidemia: Secondary | ICD-10-CM

## 2016-06-22 DIAGNOSIS — R4701 Aphasia: Secondary | ICD-10-CM

## 2016-06-22 DIAGNOSIS — F419 Anxiety disorder, unspecified: Secondary | ICD-10-CM

## 2016-06-22 DIAGNOSIS — G0490 Encephalitis and encephalomyelitis, unspecified: Secondary | ICD-10-CM

## 2016-06-22 DIAGNOSIS — I639 Cerebral infarction, unspecified: Secondary | ICD-10-CM

## 2016-06-22 DIAGNOSIS — B003 Herpesviral meningitis: Secondary | ICD-10-CM

## 2016-06-22 DIAGNOSIS — R739 Hyperglycemia, unspecified: Secondary | ICD-10-CM

## 2016-06-22 DIAGNOSIS — R471 Dysarthria and anarthria: Secondary | ICD-10-CM

## 2016-06-22 DIAGNOSIS — J4530 Mild persistent asthma, uncomplicated: Secondary | ICD-10-CM

## 2016-06-22 LAB — GLUCOSE, CAPILLARY
GLUCOSE-CAPILLARY: 110 mg/dL — AB (ref 65–99)
GLUCOSE-CAPILLARY: 99 mg/dL (ref 65–99)
Glucose-Capillary: 107 mg/dL — ABNORMAL HIGH (ref 65–99)
Glucose-Capillary: 109 mg/dL — ABNORMAL HIGH (ref 65–99)
Glucose-Capillary: 111 mg/dL — ABNORMAL HIGH (ref 65–99)
Glucose-Capillary: 86 mg/dL (ref 65–99)

## 2016-06-22 LAB — ECHOCARDIOGRAM COMPLETE
E decel time: 194 msec
EERAT: 7.89
FS: 33 % (ref 28–44)
Height: 63 in
IV/PV OW: 1.19
LA ID, A-P, ES: 25 mm
LA diam end sys: 25 mm
LA vol: 33.1 mL
LADIAMINDEX: 1.23 cm/m2
LAVOLA4C: 25.3 mL
LAVOLIN: 16.3 mL/m2
LV E/e' medial: 7.89
LV E/e'average: 7.89
LV TDI E'LATERAL: 9.68
LV TDI E'MEDIAL: 7.72
LVELAT: 9.68 cm/s
MV Dec: 194
MV pk A vel: 93.4 m/s
MVPG: 2 mmHg
MVPKEVEL: 76.4 m/s
PW: 6.75 mm — AB (ref 0.6–1.1)
VTI: 107 cm
WEIGHTICAEL: 3600 [oz_av]

## 2016-06-22 LAB — TSH: TSH: 0.155 u[IU]/mL — AB (ref 0.350–4.500)

## 2016-06-22 LAB — APTT: APTT: 24 s (ref 24–36)

## 2016-06-22 LAB — HEMOGLOBIN A1C
HEMOGLOBIN A1C: 5.6 % (ref 4.8–5.6)
Mean Plasma Glucose: 114 mg/dL

## 2016-06-22 LAB — VITAMIN B12: VITAMIN B 12: 1036 pg/mL — AB (ref 180–914)

## 2016-06-22 LAB — AMMONIA: AMMONIA: 24 umol/L (ref 9–35)

## 2016-06-22 LAB — T4, FREE: Free T4: 1.1 ng/dL (ref 0.61–1.12)

## 2016-06-22 MED ORDER — PERFLUTREN LIPID MICROSPHERE
1.0000 mL | INTRAVENOUS | Status: AC | PRN
  Administered 2016-06-22: 2 mL via INTRAVENOUS
  Filled 2016-06-22: qty 10

## 2016-06-22 MED ORDER — DEXTROSE 5 % IV SOLN
10.0000 mg/kg | Freq: Three times a day (TID) | INTRAVENOUS | Status: DC
  Administered 2016-06-22 – 2016-06-25 (×9): 725 mg via INTRAVENOUS
  Filled 2016-06-22 (×11): qty 14.5

## 2016-06-22 MED ORDER — GADOBENATE DIMEGLUMINE 529 MG/ML IV SOLN
20.0000 mL | Freq: Once | INTRAVENOUS | Status: AC | PRN
  Administered 2016-06-22: 20 mL via INTRAVENOUS

## 2016-06-22 NOTE — Progress Notes (Signed)
Echocardiogram 2D Echocardiogram with 2mL Definity has been performed.  06/22/2016 2:40 PM Gertie Fey, BS, RVT, RDCS, RDMS

## 2016-06-22 NOTE — Procedures (Signed)
Indication: Abnormal MRI and possible Encephalitis  Risks of the procedure were dicussed with the patient including post-LP headache, bleeding, infection, weakness/numbness of legs(radiculopathy), death.  The patient's and family proxy agreed and written consent was obtained.   The patient was prepped and draped, and using sterile technique a 20 gauge quinke spinal needle was inserted in the L/3/4 space. Was unable to obtain CSF. No complications. Will need to be done under fluoroscopy.    Felicie Morn PA-C [facility] [phone]  M-F  (8:30 am- 4 PM)  06/22/2016, 3:31 PM

## 2016-06-22 NOTE — Consult Note (Signed)
Physical Medicine and Rehabilitation Consult   Reason for Consult:  Encephalopathy.  Referring Physician: Dr. Ronalee Belts.    HPI: Bridget Johnston is a 56 y.o. 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 History taken from chart review and family. Patient with recent admission for respiratory failure due to PNA and was discharged to home 4/3 on steroids.  CTA head/neck normal.  MRI reviewed, no acute abnormalities. Per report, MRI//MRA brain showed no diffusion restriction. EEG with diffuse slowing and superimposed left hemisphere slowing--nonspecific in nature. Speech therapy evaluation showed signs of expressive > receptive aphasia. LP ordered for work up to rule out herpes encephalitis.  CIR recommended for follow up therapy.   Review of Systems  Unable to perform ROS: Patient nonverbal     Past Medical History:  Diagnosis Date  . Arthritis   . Asthma   . Back pain   . Diabetes mellitus without complication (HCC)   . Finger pain   . Hand pain   . High triglycerides   . Leg pain     Past Surgical History:  Procedure Laterality Date  . ABLATION  2007  . BTL  1988  . NOVASURE ABLATION    . TONSILLECTOMY    . TUBAL LIGATION      Family History  Problem Relation Age of Onset  . Heart disease Mother   . Hypertension Mother   . Heart disease Father   . Diabetes Father   . Hypertension Father   . Arthritis    . Diabetes    . Cancer - Colon Maternal Grandfather     Social History:  Married. Independent prior to admission. Per reports that she quit smoking years ago but has using E-cigarettes.  She has a 17.00 pack-year smoking history. She has never used smokeless tobacco. Per reports that she does not drink alcohol or use drugs.    Allergies  Allergen Reactions  . Prednisone Hypertension    Medications Prior to  Admission  Medication Sig Dispense Refill  . albuterol (PROAIR HFA) 108 (90 Base) MCG/ACT inhaler inhale 2 puffs INTO THE LUNGS every 4 hours if needed for wheezing or shortness of breath 8.5 g 2  . albuterol (PROVENTIL) (2.5 MG/3ML) 0.083% nebulizer solution Take 2.5 mg by nebulization every 6 (six) hours as needed for wheezing or shortness of breath.    . AMBULATORY NON FORMULARY MEDICATION Take 1 tablet by mouth daily. Patient reports taking wheat grass to help control her glucose.     . budesonide-formoterol (SYMBICORT) 160-4.5 MCG/ACT inhaler Inhale 2 puffs into the lungs 2 (two) times daily. 1 Inhaler 11  . furosemide (LASIX) 20 MG tablet Take 1 tablet (20 mg total) by mouth daily as needed for fluid or edema. 30 tablet 5  . gabapentin (NEURONTIN) 300 MG capsule Take 1 capsule (300 mg total) by mouth 3 (three) times daily. 90 capsule 5  . lisinopril (PRINIVIL,ZESTRIL) 10 MG tablet Take 1 tablet (10 mg total) by mouth daily. (Patient taking differently: Take 5 mg by mouth daily. ) 30 tablet 3  . metoprolol succinate (TOPROL-XL) 25 MG 24 hr tablet Take 25 mg by mouth daily.     [name] Kitchen omega-3 acid ethyl esters (LOVAZA) 1 g capsule Take 2 capsules (2 g total) by mouth 2 (two) times daily. 120 capsule 5  . SUBOXONE 8-2 MG FILM Take 0.5 Film by mouth 2 (  two) times daily.     . Vilazodone HCl (VIIBRYD) 40 MG TABS Take 1 tablet (40 mg total) by mouth daily. 30 tablet 5  . atorvastatin (LIPITOR) 40 MG tablet Take 1 tablet (40 mg total) by mouth daily. (Patient not taking: Reported on 06/21/2016) 30 tablet 5    Home: Home Living Family/patient expects to be discharged to:: Private residence Living Arrangements: Spouse/significant other Available Help at Discharge: Family Type of Home: House Home Access: Stairs to enter Secretary/administrator of Steps: 1 Entrance Stairs-Rails: None Home Layout: One level Firefighter: Standard Home Equipment: None  Lives With: Spouse  Functional History: Prior  Function Level of Independence: Independent Comments: Independent with all. 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 Functional Status:  Mobility: Bed Mobility Overal bed mobility: Needs Assistance Bed Mobility: Supine to Sit, Sit to Supine Rolling: Max assist Supine to sit: Min assist Sit to supine: Mod assist, Max assist, +2 for physical assistance General bed mobility comments: Pt able to arrive in long sitting from a slightly inclined position.  Min assistance required to bring R leg off EOB and to scoot R hip to EOB.  +2 assist needed for sit >supine, min assist for trunk and max assist to elevate legs into bed.  +2 max to scoot pt up in bed with bed in trendelenberg . Transfers Overall transfer level: Needs assistance Equipment used: 2 person hand held assist Transfers: Sit to/from Stand, Stand Pivot Transfers Sit to Stand: Mod assist, +2 physical assistance Stand pivot transfers: Min assist, +2 safety/equipment General transfer comment: Mod +2 assist for sit>stand for R knee blocking and power up but pt did not note knee buckle. Pt required min assist for pivot to [facility] and +2 assist for equipment and safety.  VC's required for hand placement. Ambulation/Gait General Gait Details: not attempted this session    ADL: ADL Overall ADL's : Needs assistance/impaired Grooming: Maximal assistance, Bed level Grooming Details (indicate cue type and reason): Pt able to grasp washcloth but did not initiate hand to face movements despite hand over hand assistance.[name] Kitchen  Upper Body Bathing: Maximal assistance, Bed level Lower Body Bathing: Maximal assistance, Bed level Upper Body Dressing : Maximal assistance, Bed level Lower Body Dressing: Maximal assistance, Bed level Toileting- Clothing Manipulation and Hygiene: Maximal assistance, Bed level General ADL Comments: Pt lethargic and with difficulty following commands. Able to   Cognition: Cognition Overall Cognitive Status:  Impaired/Different from baseline Arousal/Alertness: Awake/alert Orientation Level: Oriented to place, Oriented to person (pt able to choose correct place from choice of 2) Attention: Sustained Memory:  (difficult to assess, able to identify spouse (via turning to look at husband)) Awareness:  (uncertain to consistency of awareness) Cognition Arousal/Alertness: Lethargic Behavior During Therapy: Flat affect Overall Cognitive Status: Impaired/Different from baseline Area of Impairment: Attention, Following commands, Safety/judgement, Awareness, Problem solving Current Attention Level: Selective Following Commands: Follows one step commands inconsistently, Follows one step commands with increased time Safety/Judgement: Decreased awareness of safety, Decreased awareness of deficits Awareness: Intellectual Problem Solving: Slow processing, Decreased initiation, Difficulty sequencing, Requires verbal cues, Requires tactile cues General Comments: Pt responds to yes/no questions (shaking head or nodding), turns head to the L when her name is called, follows commands 25% of the time.   Blood pressure 139/73, pulse 79, temperature 99 F (37.2 C), temperature source Axillary, resp. rate 20, height  (1.6 m), weight 102.1 kg (225 lb), SpO2 100 %. Physical Exam  Nursing note and vitals reviewed. Constitutional: She appears  well-developed and well-nourished.  HENT:  Head: Normocephalic and atraumatic.  Mouth/Throat: Oropharynx is clear and moist.  Eyes: Conjunctivae are normal. Pupils are equal, round, and reactive to light.  Neck: Normal range of motion. Neck supple.  Cardiovascular: Normal rate and regular rhythm.   Respiratory: Effort normal and breath sounds normal. No stridor. No respiratory distress. She has no wheezes.  GI: Soft. Bowel sounds are normal. She exhibits no distension. There is no tenderness.  Musculoskeletal: She exhibits no edema or tenderness.  Neurological: She is  alert.  Non verbal.  Expressive and receptive deficits--unable to follow simple motor commands even with visual and tactile cues. Distracted and perseverative behaviors noted.  DTRs 3+ b/l LE Moving all extremities B/l ankle clonus Neg hoffman's b/l  Skin: Skin is warm and dry.  Psychiatric:  Unable to assess due to cognition    Results for orders placed or performed during the hospital encounter of 06/21/16 (from the past 24 hour(s))  Urine rapid drug screen (hosp performed)not at [facility]     Status: None   Collection Time: 06/21/16 11:10 AM  Result Value Ref Range   Opiates NONE DETECTED NONE DETECTED   Cocaine NONE DETECTED NONE DETECTED   Benzodiazepines NONE DETECTED NONE DETECTED   Amphetamines NONE DETECTED NONE DETECTED   Tetrahydrocannabinol NONE DETECTED NONE DETECTED   Barbiturates NONE DETECTED NONE DETECTED  Urinalysis, Routine w reflex microscopic     Status: Abnormal   Collection Time: 06/21/16 11:10 AM  Result Value Ref Range   Color, Urine YELLOW YELLOW   APPearance HAZY (A) CLEAR   Specific Gravity, Urine 1.045 (H) 1.005 - 1.030   pH 5.0 5.0 - 8.0   Glucose, UA 50 (A) NEGATIVE mg/dL   Hgb urine dipstick NEGATIVE NEGATIVE   Bilirubin Urine NEGATIVE NEGATIVE   Ketones, ur NEGATIVE NEGATIVE mg/dL   Protein, ur NEGATIVE NEGATIVE mg/dL   Nitrite NEGATIVE NEGATIVE   Leukocytes, UA NEGATIVE NEGATIVE  Glucose, capillary     Status: Abnormal   Collection Time: 06/21/16 11:35 AM  Result Value Ref Range   Glucose-Capillary 127 (H) 65 - 99 mg/dL  Glucose, capillary     Status: Abnormal   Collection Time: 06/21/16  4:18 PM  Result Value Ref Range   Glucose-Capillary 123 (H) 65 - 99 mg/dL  Glucose, capillary     Status: Abnormal   Collection Time: 06/21/16  9:43 PM  Result Value Ref Range   Glucose-Capillary 125 (H) 65 - 99 mg/dL   Comment 1 Notify RN    Comment 2 Document in Chart   Glucose, capillary     Status: Abnormal   Collection Time: 06/22/16  6:22 AM    Result Value Ref Range   Glucose-Capillary 107 (H) 65 - 99 mg/dL   Comment 1 Notify RN    Comment 2 Document in Chart   TSH     Status: Abnormal   Collection Time: 06/22/16  7:55 AM  Result Value Ref Range   TSH 0.155 (L) 0.350 - 4.500 uIU/mL  Ammonia     Status: None   Collection Time: 06/22/16  7:55 AM  Result Value Ref Range   Ammonia 24 9 - 35 umol/L  Vitamin B12     Status: Abnormal   Collection Time: 06/22/16  7:55 AM  Result Value Ref Range   Vitamin B-12 1,036 (H) 180 - 914 pg/mL   Ct Angio Head W Or Wo Contrast  Result Date: 06/21/2016 CLINICAL DATA:  Confusion with altered mental  status. EXAM: CT ANGIOGRAPHY HEAD AND NECK TECHNIQUE: Multidetector CT imaging of the head and neck was performed using the standard protocol during bolus administration of intravenous contrast. Multiplanar CT image reconstructions and MIPs were obtained to evaluate the vascular anatomy. Carotid stenosis measurements (when applicable) are obtained utilizing NASCET criteria, using the distal internal carotid diameter as the denominator. CONTRAST:  50 mL Isovue 370 COMPARISON:  Concomitant head CT. FINDINGS: CTA NECK FINDINGS Aortic arch: There is no aneurysm or dissection of the visualized ascending aorta or aortic arch. There is a normal 3 vessel branching pattern. The visualized proximal subclavian arteries are normal. Right carotid system: The right common carotid origin is widely patent. There is no common carotid or internal carotid artery dissection or aneurysm. No hemodynamically significant stenosis. Left carotid system: The left common carotid origin is widely patent. There is no common carotid or internal carotid artery dissection or aneurysm. No hemodynamically significant stenosis. Vertebral arteries: The vertebral system is left dominant. Both vertebral artery origins are normal. Both vertebral arteries are normal to their confluence with the basilar artery. Skeleton: There is no bony spinal canal  stenosis. No lytic or blastic lesions. Other neck: The nasopharynx is clear. The oropharynx and hypopharynx are normal. The epiglottis is normal. The supraglottic larynx, glottis and subglottic larynx are normal. No retropharyngeal collection. The parapharyngeal spaces are preserved. The parotid and submandibular glands are normal. No sialolithiasis or salivary ductal dilatation. The thyroid gland is normal. There is no cervical lymphadenopathy. Upper chest: No pneumothorax or pleural effusion. No nodules or masses. Review of the MIP images confirms the above findings CTA HEAD FINDINGS Anterior circulation: --Intracranial internal carotid arteries: Normal. --Anterior cerebral arteries: There is an absent right A1 segment, a normal congenital variant. The left ACA is normal. --Middle cerebral arteries: Normal. --Posterior communicating arteries: Absent bilaterally. Posterior circulation: --Posterior cerebral arteries: Normal. --Superior cerebellar arteries: Normal. --Basilar artery: Normal. --Anterior inferior cerebellar arteries: Normal. --Posterior inferior cerebellar arteries: Normal. Venous sinuses: As permitted by contrast timing, patent. Anatomic variants: Absent right anterior cerebral artery A1 segment Delayed phase:  Not performed. Review of the MIP images confirms the above findings IMPRESSION: Normal CTA of the head and neck. Electronically Signed   By: Deatra Robinson M.D.   On: 06/21/2016 02:44   Dg Chest 2 View  Result Date: 06/21/2016 CLINICAL DATA:  Acute onset of altered mental status. Aphasia. Initial encounter. EXAM: CHEST  2 VIEW COMPARISON:  None. FINDINGS: The lungs are well-aerated and clear. There is no evidence of focal opacification, pleural effusion or pneumothorax. The heart is normal in size; the mediastinal contour is within normal limits. No acute osseous abnormalities are seen. IMPRESSION: No acute cardiopulmonary process seen. Electronically Signed   By: Roanna Raider M.D.   On:  06/21/2016 04:27   Ct Angio Neck W Or Wo Contrast  Result Date: 06/21/2016 CLINICAL DATA:  Confusion with altered mental status. EXAM: CT ANGIOGRAPHY HEAD AND NECK TECHNIQUE: Multidetector CT imaging of the head and neck was performed using the standard protocol during bolus administration of intravenous contrast. Multiplanar CT image reconstructions and MIPs were obtained to evaluate the vascular anatomy. Carotid stenosis measurements (when applicable) are obtained utilizing NASCET criteria, using the distal internal carotid diameter as the denominator. CONTRAST:  50 mL Isovue 370 COMPARISON:  Concomitant head CT. FINDINGS: CTA NECK FINDINGS Aortic arch: There is no aneurysm or dissection of the visualized ascending aorta or aortic arch. There is a normal 3 vessel branching pattern. The visualized proximal  subclavian arteries are normal. Right carotid system: The right common carotid origin is widely patent. There is no common carotid or internal carotid artery dissection or aneurysm. No hemodynamically significant stenosis. Left carotid system: The left common carotid origin is widely patent. There is no common carotid or internal carotid artery dissection or aneurysm. No hemodynamically significant stenosis. Vertebral arteries: The vertebral system is left dominant. Both vertebral artery origins are normal. Both vertebral arteries are normal to their confluence with the basilar artery. Skeleton: There is no bony spinal canal stenosis. No lytic or blastic lesions. Other neck: The nasopharynx is clear. The oropharynx and hypopharynx are normal. The epiglottis is normal. The supraglottic larynx, glottis and subglottic larynx are normal. No retropharyngeal collection. The parapharyngeal spaces are preserved. The parotid and submandibular glands are normal. No sialolithiasis or salivary ductal dilatation. The thyroid gland is normal. There is no cervical lymphadenopathy. Upper chest: No pneumothorax or pleural  effusion. No nodules or masses. Review of the MIP images confirms the above findings CTA HEAD FINDINGS Anterior circulation: --Intracranial internal carotid arteries: Normal. --Anterior cerebral arteries: There is an absent right A1 segment, a normal congenital variant. The left ACA is normal. --Middle cerebral arteries: Normal. --Posterior communicating arteries: Absent bilaterally. Posterior circulation: --Posterior cerebral arteries: Normal. --Superior cerebellar arteries: Normal. --Basilar artery: Normal. --Anterior inferior cerebellar arteries: Normal. --Posterior inferior cerebellar arteries: Normal. Venous sinuses: As permitted by contrast timing, patent. Anatomic variants: Absent right anterior cerebral artery A1 segment Delayed phase:  Not performed. Review of the MIP images confirms the above findings IMPRESSION: Normal CTA of the head and neck. Electronically Signed   By: Deatra Robinson M.D.   On: 06/21/2016 02:44   Mr Brain Wo Contrast  Result Date: 06/21/2016 CLINICAL DATA:  Confusion and aphasia EXAM: MRI HEAD WITHOUT CONTRAST TECHNIQUE: Multiplanar, multiecho pulse sequences of the brain and surrounding structures were obtained without intravenous contrast. COMPARISON:  Head CT 06/21/2016 FINDINGS: Brain: No focal diffusion restriction to indicate acute infarct. No intraparenchymal hemorrhage. There is mild multifocal hyperintense T2-weighted signal within the periventricular white matter, most often seen in the setting of chronic microvascular ischemia. No mass lesion or midline shift. No hydrocephalus or extra-axial fluid collection. The midline structures are normal. No age advanced or lobar predominant atrophy. Vascular: Major intracranial arterial and venous sinus flow voids are preserved. No evidence of chronic microhemorrhage or amyloid angiopathy. Skull and upper cervical spine: The visualized skull base, calvarium, upper cervical spine and extracranial soft tissues are normal.  Sinuses/Orbits: No fluid levels or advanced mucosal thickening. No mastoid effusion. Normal orbits. IMPRESSION: 1. No acute intracranial abnormality. 2. Findings of mild chronic microvascular ischemia. Electronically Signed   By: Deatra Robinson M.D.   On: 06/21/2016 05:56   Ct Head Code Stroke W/o Cm  Result Date: 06/21/2016 CLINICAL DATA:  Code stroke.  Confusion EXAM: CT HEAD WITHOUT CONTRAST TECHNIQUE: Contiguous axial images were obtained from the base of the skull through the vertex without intravenous contrast. COMPARISON:  None. FINDINGS: Brain: No mass lesion, intraparenchymal hemorrhage or extra-axial collection. No evidence of acute cortical infarct. There is periventricular hypoattenuation compatible with chronic microvascular disease. Mildly advanced atrophy for age. Vascular: No hyperdense vessel or unexpected calcification. Skull: Normal visualized skull base, calvarium and extracranial soft tissues. Sinuses/Orbits: Right maxillary sinus fluid level. Normal orbits. ASPECTS [facility] Stroke Program Early CT Score) - Ganglionic level infarction (caudate, lentiform nuclei, internal capsule, insula, M1-M3 cortex): 7 - Supraganglionic infarction (M4-M6 cortex): 3 Total score (0-10 with 10 being  normal): 10 IMPRESSION: 1. Mild age advanced atrophy without acute intracranial abnormality. 2. Fluid level in the right maxillary sinus. Correlate clinically for signs of acute sinusitis. 3. ASPECTS is 10. These results were called by telephone at the time of interpretation on 06/21/2016 at 2:01 am to Dr. Ritta Slot, who verbally acknowledged these results. Electronically Signed   By: Deatra Robinson M.D.   On: 06/21/2016 02:02    Assessment/Plan: Diagnosis: Encephalopathy Labs and images independently reviewed.  Records reviewed and summated above.  1. Does the need for close, 24 hr/day medical supervision in concert with the patient's rehab needs make it unreasonable for this patient to be served in  a less intensive setting? Potentially  2. 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 Due to bladder management, bowel management, safety, skin/wound care, disease management, medication administration, pain management and patient education, does the patient require 24 hr/day rehab nursing? Yes 4. Does the patient require coordinated care of a physician, rehab nurse, PT (1-2 hrs/day, 5 days/week), OT (1-2 hrs/day, 5 days/week) and SLP (1-2 hrs/day, 5 days/week) to address physical and functional deficits in the context of the above medical diagnosis(es)? Yes Addressing deficits in the following areas: balance, endurance, locomotion, transferring, bowel/bladder control, bathing, dressing, cognition, speech and psychosocial support 5. Can the patient actively participate in an intensive therapy program of at least 3 hrs of therapy per day at least 5 days per week? Potentially 6. The potential for patient to make measurable gains while on inpatient rehab is excellent 7. Anticipated functional outcomes upon discharge from inpatient rehab are supervision  with PT, supervision with OT, supervision with SLP. 8. Estimated rehab length of stay to reach the above functional goals is: 10-14 days. 9. Does the patient have adequate social supports and living environment to accommodate these discharge functional goals? Yes 10. Anticipated D/C setting:  Home 11. Anticipated post D/C treatments: HH therapy and Home excercise program 12. Overall Rehab/Functional Prognosis: good  RECOMMENDATIONS: This patient's condition is appropriate for continued rehabilitative care in the following setting: Will await PT evals and completion of medical workup.  Will consider CIR if cognitive deficits persist after treatment initiated.  Patient has agreed to participate in recommended program. Potentially Note that insurance prior authorization may be required for reimbursement for recommended care.  Comment: Rehab Admissions Coordinator to follow up.  Maryla Morrow, MD, [address] [place] 06/22/2016

## 2016-06-22 NOTE — Care Management Note (Signed)
Case Management Note  Patient Details  Name: Bridget Johnston MRN: [id number] Date of Birth: 01/11/1961  Subjective/Objective:   Pt admitted with r/o CVA. MRI results negative. She is from home with her spouse.                 Action/Plan: PT/OT recommending CIR. CM following for d/c disposition.   Expected Discharge Date:                  Expected Discharge Plan:  IP Rehab Facility  In-House Referral:     Discharge planning Services     Post Acute Care Choice:    Choice offered to:     DME Arranged:    DME Agency:     HH Arranged:    HH Agency:     Status of Service:  In process, will continue to follow  If discussed at Long Length of Stay Meetings, dates discussed:    Additional Comments:  Kermit Balo, RN 06/22/2016, 10:30 AM

## 2016-06-22 NOTE — Progress Notes (Signed)
Subjective: [place] information was gathered by family as patient is aphasic.   Exam: Vitals:   06/22/16 0101 06/22/16 0548  BP: (!) 149/75 139/73  Pulse: 85 79  Resp: 20 20  Temp: 98.6 F (37 C) 99 F (37.2 C)    HEENT-  Normocephalic, no lesions, without obvious abnormality.  Normal external eye and conjunctiva.  Normal TM's bilaterally.  Normal auditory canals and external ears. Normal external nose, mucus membranes and septum.  Normal pharynx.    Neuro:  CN: Pupils are equal and round. They are symmetrically reactive from 3-->2 mm. EOMI without nystagmus. Facial sensation is intact to light touch. Face is symmetric at rest with normal strength and mobility. Hearing is intact to conversational voice. Palate elevates symmetrically and uvula is midline. Patient remains a phasic with word substitutions and word repetition. Bilateral SCM and trapezii are 5/5. Tongue is midline with normal bulk and mobility.  Motor: Normal bulk, tone, and strength. 5/5 throughout. No drift.  Sensation: Intact to light touch.  DTRs: 2+, symmetric  Toes downgoing bilaterally. No pathologic reflexes.  Coordination: Finger-to-nose and heel-to-shin are without dysmetria     Pertinent Labs/Diagnostics: EEG:  Description: The patient is awake and drowsy during the recording.  During maximal wakefulness, there is a asymmetric, medium voltage 9 Hz posterior dominant rhythm on the right that attenuates with eye opening.  There is no discernible posterior dominant rhythm on the left. This is admixed with diffuse 4-5 Hz theta and 2-3 Hz delta slowing of the waking background.  There is superimposed left hemispheric slowing. During drowsiness, there is an increase in theta slowing of the background.  Stage 2 sleep was not seen.  There were no epileptiform discharges or electrographic seizures seen.    EKG lead was unremarkable.  Impression: This awake and drowsy EEG is abnormal due to diffuse slowing and  superimposed left hemispheric slowing of the waking background.  Clinical Correlation of the above findings indicates focal abnormality in the left hemisphere, as well as diffuse cerebral dysfunction that is non-specific in etiology and can be seen with hypoxic/ischemic injury, toxic/metabolic encephalopathies, neurodegenerative disorders, or medication effect.  The absence of epileptiform discharges does not rule out a clinical diagnosis of epilepsy.  Clinical correlation is advised.  MRI brain pending      Impression: 56 year old female presenting to the hospital with new onset aphasia. MRI brain negative for acute infarct. EEG did show diffuse slowing and superimposed left hemispheric slowing. At this time there is concern for possible viral or HSV encephalitis. Patient will be started on acyclovir per pharmacy stat. Unfortunately patient received Lovenox yesterday at 1447 and LP will not be able to be done until later this afternoon. We will continue to follow LP results after LP is obtained  Long discussion about lumbar puncture, procedure, indication, possible side effects, time in which labs will return, and why we are doing this was had with the family and they are all in agreement for lumbar puncture. They all were well-informed and were able to repeat what I informed them about.   Felicie Morn PA-C [facility] [phone]    06/22/2016, 11:38 AM

## 2016-06-22 NOTE — Progress Notes (Addendum)
PROGRESS NOTE    Bridget Johnston  KGM:[id number] DOB: Dec 03, 1960 DOA: 06/21/2016 PCP: Tandy Gaw, PA-C   Brief Narrative: 56 year old female with history of dyslipidemia, hypertension, diabetes, on chronic Suboxone brought in by patient's husband for the evaluation of altered mental status including difficulty speaking. On arrival code stroke was activated and evaluated by neurologist.  She was recently hospitalized in [place] with respiratory failure due to pneumonia. She was intubated and discharged on prednisone.As per family, pt developed allergy to prednisone.    Assessment & Plan:   # Presumed acute ischemic stroke on admission, with thalamic aphasia/acute expressive aphasia, clinically unknown. -Evaluated by neurologist. -MRI of the brain showed no acute finding. -EEG pending -Echocardiogram, carotid ultrasound pending -Continue aspirin, Lipitor -Patient has muscle strength 5 over 5 in all extremities. She was more alert awake and speaking  few words today.  -I will check TSH, ammonia level and vitamin B12 level. -LDL 165, a1c 5.6 -Follow-up neurologist plan ?  Need for LP. -Unknown if aphasia is  contributed by  ? patient's psychiatric illness.  -Added by PT, OT recommended inpatient rehabilitation. Consult CIR -Speech and swallow evaluation.  # mild fever unknown etiology:  UA negative for UTI. CXR with no acute finding. Monitor fever curve.  # Hypertension: Continue current medications. Monitor blood pressure.  #History of opiate dependent: continue buprenorphine  # Anxiety depression: Continue home medications.  Principal Problem:   Acute ischemic stroke (HCC) Active Problems:   Opiate addiction (HCC)   Asthma   Hyperlipidemia   Hyperglycemia   Anxiety and depression   Chronic systolic CHF (congestive heart failure) (HCC)   Aphasia   Essential hypertension Obesity  DVT prophylaxis:lovenox sq Code Status:full Family Communication:Discussed with the  patient husband and 2 daughters at bedside  Disposition Plan:Likely discharge to rehabilitation in 1-2 days    Consultants:   Neurologist  Inpatient rehabilitation  Procedures:MRI  Antimicrobials:None  Subjective: Patient was seen and examined at bedside. She was alert, awake and oriented to herself. She was able to make the words and sentences however looks difficulty with word finding. Patient reported headache. Denied dizziness, nausea, vomiting, chest pain, shortness of breath, diarrhea, dysuria, urgency or frequency. Patient's family members at bedside  Objective: Vitals:   06/21/16 2100 06/22/16 0101 06/22/16 0548 06/22/16 0725  BP: (!) 165/73 (!) 149/75 139/73   Pulse: 83 85 79   Resp: Temp: (!) 100.4 F (38 C) 98.6 F (37 C) 99 F (37.2 C)   TempSrc: Axillary Axillary Axillary   SpO2: 100% 96% 96% 100%  Weight:      Height:        Intake/Output Summary (Last 24 hours) at 06/22/16 1004 Last data filed at 06/22/16 0302  Gross per 24 hour  Intake          1546.67 ml  Output                0 ml  Net          1546.67 ml   Filed Weights   06/21/16 0406 06/21/16 0625  Weight: 102.7 kg (226 lb 6.6 oz) 102.1 kg (225 lb)    Examination:  General exam: Appears calm and comfortable Respiratory system: Clear to auscultation. Respiratory effort normal. No wheezing or crackle Cardiovascular system: S1 & S2 heard, RRR.  No pedal edema. Gastrointestinal system: Abdomen is nondistended, soft and nontender. Normal bowel sounds heard. Central nervous system: Alert awake, following simple commands, having word finding  difficulty Extremities: Symmetric 5 x 5 power. Skin: No rashes, lesions or ulcers Psychiatry: Judgement and insight appear normal. Mood & affect appropriate.     Data Reviewed: I have personally reviewed following labs and imaging studies  CBC:  Recent Labs Lab 06/21/16 0134 06/21/16 0150  WBC 7.2  --   NEUTROABS 5.3  --   HGB 12.6 12.2    HCT 36.3 36.0  MCV 92.8  --   PLT 270  --    Basic Metabolic Panel:  Recent Labs Lab 06/21/16 0134 06/21/16 0150  NA 134* 134*  K 3.7 3.7  CL 99* 98*  CO2 26  --   GLUCOSE 150* 146*  BUN 17 18  CREATININE 0.94 0.90  CALCIUM 9.2  --    GFR: Estimated Creatinine Clearance: 79.7 mL/min (by C-G formula based on SCr of 0.9 mg/dL). Liver Function Tests:  Recent Labs Lab 06/21/16 0134  AST 21  ALT 33  ALKPHOS 50  BILITOT 0.7  PROT 6.5  ALBUMIN 3.9   No results for input(s): LIPASE, AMYLASE in the last 168 hours.  Recent Labs Lab 06/22/16 0755  AMMONIA 24   Coagulation Profile:  Recent Labs Lab 06/21/16 0134  INR 0.87   Cardiac Enzymes: No results for input(s): CKTOTAL, CKMB, CKMBINDEX, TROPONINI in the last 168 hours. BNP (last 3 results) No results for input(s): PROBNP in the last 8760 hours. HbA1C:  Recent Labs  06/21/16 0603  HGBA1C 5.6   CBG:  Recent Labs Lab 06/21/16 0647 06/21/16 1135 06/21/16 1618 06/21/16 2143 06/22/16 0622  GLUCAP 147* 127* 123* 125* 107*   Lipid Profile:  Recent Labs  06/21/16 0603  CHOL 235*  HDL 48  LDLCALC 165*  TRIG 108  CHOLHDL 4.9   Thyroid Function Tests:  Recent Labs  06/22/16 0755  TSH 0.155*   Anemia Panel:  Recent Labs  06/22/16 0755  VITAMINB12 1,036*   Sepsis Labs: No results for input(s): PROCALCITON, LATICACIDVEN in the last 168 hours.  No results found for this or any previous visit (from the past 240 hour(s)).       Radiology Studies: Ct Angio Head W Or Wo Contrast  Result Date: 06/21/2016 CLINICAL DATA:  Confusion with altered mental status. EXAM: CT ANGIOGRAPHY HEAD AND NECK TECHNIQUE: Multidetector CT imaging of the head and neck was performed using the standard protocol during bolus administration of intravenous contrast. Multiplanar CT image reconstructions and MIPs were obtained to evaluate the vascular anatomy. Carotid stenosis measurements (when applicable) are  obtained utilizing NASCET criteria, using the distal internal carotid diameter as the denominator. CONTRAST:  50 mL Isovue 370 COMPARISON:  Concomitant head CT. FINDINGS: CTA NECK FINDINGS Aortic arch: There is no aneurysm or dissection of the visualized ascending aorta or aortic arch. There is a normal 3 vessel branching pattern. The visualized proximal subclavian arteries are normal. Right carotid system: The right common carotid origin is widely patent. There is no common carotid or internal carotid artery dissection or aneurysm. No hemodynamically significant stenosis. Left carotid system: The left common carotid origin is widely patent. There is no common carotid or internal carotid artery dissection or aneurysm. No hemodynamically significant stenosis. Vertebral arteries: The vertebral system is left dominant. Both vertebral artery origins are normal. Both vertebral arteries are normal to their confluence with the basilar artery. Skeleton: There is no bony spinal canal stenosis. No lytic or blastic lesions. Other neck: The nasopharynx is clear. The oropharynx and hypopharynx are normal. The epiglottis is normal. The  supraglottic larynx, glottis and subglottic larynx are normal. No retropharyngeal collection. The parapharyngeal spaces are preserved. The parotid and submandibular glands are normal. No sialolithiasis or salivary ductal dilatation. The thyroid gland is normal. There is no cervical lymphadenopathy. Upper chest: No pneumothorax or pleural effusion. No nodules or masses. Review of the MIP images confirms the above findings CTA HEAD FINDINGS Anterior circulation: --Intracranial internal carotid arteries: Normal. --Anterior cerebral arteries: There is an absent right A1 segment, a normal congenital variant. The left ACA is normal. --Middle cerebral arteries: Normal. --Posterior communicating arteries: Absent bilaterally. Posterior circulation: --Posterior cerebral arteries: Normal. --Superior  cerebellar arteries: Normal. --Basilar artery: Normal. --Anterior inferior cerebellar arteries: Normal. --Posterior inferior cerebellar arteries: Normal. Venous sinuses: As permitted by contrast timing, patent. Anatomic variants: Absent right anterior cerebral artery A1 segment Delayed phase:  Not performed. Review of the MIP images confirms the above findings IMPRESSION: Normal CTA of the head and neck. Electronically Signed   By: Deatra Robinson M.D.   On: 06/21/2016 02:44   Dg Chest 2 View  Result Date: 06/21/2016 CLINICAL DATA:  Acute onset of altered mental status. Aphasia. Initial encounter. EXAM: CHEST  2 VIEW COMPARISON:  None. FINDINGS: The lungs are well-aerated and clear. There is no evidence of focal opacification, pleural effusion or pneumothorax. The heart is normal in size; the mediastinal contour is within normal limits. No acute osseous abnormalities are seen. IMPRESSION: No acute cardiopulmonary process seen. Electronically Signed   By: Roanna Raider M.D.   On: 06/21/2016 04:27   Ct Angio Neck W Or Wo Contrast  Result Date: 06/21/2016 CLINICAL DATA:  Confusion with altered mental status. EXAM: CT ANGIOGRAPHY HEAD AND NECK TECHNIQUE: Multidetector CT imaging of the head and neck was performed using the standard protocol during bolus administration of intravenous contrast. Multiplanar CT image reconstructions and MIPs were obtained to evaluate the vascular anatomy. Carotid stenosis measurements (when applicable) are obtained utilizing NASCET criteria, using the distal internal carotid diameter as the denominator. CONTRAST:  50 mL Isovue 370 COMPARISON:  Concomitant head CT. FINDINGS: CTA NECK FINDINGS Aortic arch: There is no aneurysm or dissection of the visualized ascending aorta or aortic arch. There is a normal 3 vessel branching pattern. The visualized proximal subclavian arteries are normal. Right carotid system: The right common carotid origin is widely patent. There is no common  carotid or internal carotid artery dissection or aneurysm. No hemodynamically significant stenosis. Left carotid system: The left common carotid origin is widely patent. There is no common carotid or internal carotid artery dissection or aneurysm. No hemodynamically significant stenosis. Vertebral arteries: The vertebral system is left dominant. Both vertebral artery origins are normal. Both vertebral arteries are normal to their confluence with the basilar artery. Skeleton: There is no bony spinal canal stenosis. No lytic or blastic lesions. Other neck: The nasopharynx is clear. The oropharynx and hypopharynx are normal. The epiglottis is normal. The supraglottic larynx, glottis and subglottic larynx are normal. No retropharyngeal collection. The parapharyngeal spaces are preserved. The parotid and submandibular glands are normal. No sialolithiasis or salivary ductal dilatation. The thyroid gland is normal. There is no cervical lymphadenopathy. Upper chest: No pneumothorax or pleural effusion. No nodules or masses. Review of the MIP images confirms the above findings CTA HEAD FINDINGS Anterior circulation: --Intracranial internal carotid arteries: Normal. --Anterior cerebral arteries: There is an absent right A1 segment, a normal congenital variant. The left ACA is normal. --Middle cerebral arteries: Normal. --Posterior communicating arteries: Absent bilaterally. Posterior circulation: --Posterior cerebral arteries:  Normal. --Superior cerebellar arteries: Normal. --Basilar artery: Normal. --Anterior inferior cerebellar arteries: Normal. --Posterior inferior cerebellar arteries: Normal. Venous sinuses: As permitted by contrast timing, patent. Anatomic variants: Absent right anterior cerebral artery A1 segment Delayed phase:  Not performed. Review of the MIP images confirms the above findings IMPRESSION: Normal CTA of the head and neck. Electronically Signed   By: Deatra Robinson M.D.   On: 06/21/2016 02:44   Mr  Brain Wo Contrast  Result Date: 06/21/2016 CLINICAL DATA:  Confusion and aphasia EXAM: MRI HEAD WITHOUT CONTRAST TECHNIQUE: Multiplanar, multiecho pulse sequences of the brain and surrounding structures were obtained without intravenous contrast. COMPARISON:  Head CT 06/21/2016 FINDINGS: Brain: No focal diffusion restriction to indicate acute infarct. No intraparenchymal hemorrhage. There is mild multifocal hyperintense T2-weighted signal within the periventricular white matter, most often seen in the setting of chronic microvascular ischemia. No mass lesion or midline shift. No hydrocephalus or extra-axial fluid collection. The midline structures are normal. No age advanced or lobar predominant atrophy. Vascular: Major intracranial arterial and venous sinus flow voids are preserved. No evidence of chronic microhemorrhage or amyloid angiopathy. Skull and upper cervical spine: The visualized skull base, calvarium, upper cervical spine and extracranial soft tissues are normal. Sinuses/Orbits: No fluid levels or advanced mucosal thickening. No mastoid effusion. Normal orbits. IMPRESSION: 1. No acute intracranial abnormality. 2. Findings of mild chronic microvascular ischemia. Electronically Signed   By: Deatra Robinson M.D.   On: 06/21/2016 05:56   Ct Head Code Stroke W/o Cm  Result Date: 06/21/2016 CLINICAL DATA:  Code stroke.  Confusion EXAM: CT HEAD WITHOUT CONTRAST TECHNIQUE: Contiguous axial images were obtained from the base of the skull through the vertex without intravenous contrast. COMPARISON:  None. FINDINGS: Brain: No mass lesion, intraparenchymal hemorrhage or extra-axial collection. No evidence of acute cortical infarct. There is periventricular hypoattenuation compatible with chronic microvascular disease. Mildly advanced atrophy for age. Vascular: No hyperdense vessel or unexpected calcification. Skull: Normal visualized skull base, calvarium and extracranial soft tissues. Sinuses/Orbits: Right  maxillary sinus fluid level. Normal orbits. ASPECTS [facility] Stroke Program Early CT Score) - Ganglionic level infarction (caudate, lentiform nuclei, internal capsule, insula, M1-M3 cortex): 7 - Supraganglionic infarction (M4-M6 cortex): 3 Total score (0-10 with 10 being normal): 10 IMPRESSION: 1. Mild age advanced atrophy without acute intracranial abnormality. 2. Fluid level in the right maxillary sinus. Correlate clinically for signs of acute sinusitis. 3. ASPECTS is 10. These results were called by telephone at the time of interpretation on 06/21/2016 at 2:01 am to Dr. Ritta Slot, who verbally acknowledged these results. Electronically Signed   By: Deatra Robinson M.D.   On: 06/21/2016 02:02        Scheduled Meds: . aspirin  300 mg Rectal Daily   Or  . aspirin  325 mg Oral Daily  . atorvastatin  40 mg Oral q1800  . buprenorphine  8 mg Sublingual Daily  . enoxaparin (LOVENOX) injection  40 mg Subcutaneous Daily  . gabapentin  300 mg Oral TID  . insulin aspart  0-9 Units Subcutaneous Q4H  . metoprolol succinate  25 mg Oral Daily  . mometasone-formoterol  2 puff Inhalation BID  . omega-3 acid ethyl esters  2 g Oral BID  . Vilazodone HCl  40 mg Oral Daily   Continuous Infusions: . dextrose 5 % and 0.9 % NaCl with KCl 40 mEq/L 100 mL/hr at 06/21/16 1134     LOS: 1 day    Zak Gondek Jaynie Collins, MD [facility] Pager [phone]  If 7PM-7AM, please contact night-coverage [email or web address] Password [email or web address] 06/22/2016, 10:04 AM

## 2016-06-22 NOTE — Progress Notes (Signed)
EEG Completed; Results Pending  

## 2016-06-22 NOTE — Progress Notes (Signed)
Rehab Admissions Coordinator Note:  Patient was screened by Trish Mage for appropriateness for an Inpatient Acute Rehab Consult.  At this time, we are recommending Inpatient Rehab consult.  Lelon Frohlich M 06/22/2016, 10:09 AM  I can be reached at [phone].

## 2016-06-22 NOTE — Evaluation (Signed)
Speech Language Pathology Evaluation Patient Details Name: Bridget Johnston MRN: [id number] DOB: December 23, 1960 Today's Date: 06/22/2016 Time: 4098-1191 SLP Time Calculation (min) (ACUTE ONLY): 35 min  Problem List:  Patient Active Problem List   Diagnosis Date Noted  . Aphasia 06/21/2016  . Acute ischemic stroke (HCC) 06/21/2016  . Essential hypertension 06/21/2016  . Hypotension due to drugs 06/19/2016  . Acute respiratory failure with hypoxia (HCC) 06/19/2016  . Chronic systolic CHF (congestive heart failure) (HCC) 06/09/2016  . Hyperlipidemia 07/23/2014  . Hyperglycemia 07/23/2014  . Vitamin D deficiency 07/23/2014  . Elevated liver enzymes 07/23/2014  . Anxiety and depression 07/23/2014  . Fibromyalgia 07/23/2014  . Opiate addiction (HCC) 01/03/2014  . Asthma 01/03/2014  . 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 HYPERLIPIDEMIA 08/25/2010   Past Medical History:  Past Medical History:  Diagnosis Date  . Arthritis   . Asthma   . Back pain   . Diabetes mellitus without complication (HCC)   . Finger pain   . Hand pain   . High triglycerides   . Leg pain    Past Surgical History:  Past Surgical History:  Procedure Laterality Date  . ABLATION  2007  . BTL  1988  . NOVASURE ABLATION    . TONSILLECTOMY    . TUBAL LIGATION     HPI:  56 yo female adm to [facility] with right neglect, AMS, speaking difficulty, opiate use, slurred speech.  PMH + for mild microvascular ischemia, MRI negative, mild microvascular ischemia 4/15.    Assessment / Plan / Recommendation Clinical Impression  Pt presents with signs of expressive more than receptive aphasia - resulting in nonfluent speech production, absence of repetition ability.  She did articulate "I'm alright", "I'm ok" during session = daughter reports she was saying this over the weekend.  Y/N 75% accurate for basic ?s and identifying objects.  One step directions followed 3/4 times correctly.  Pt able to trace her  name and recognized it.  Note MRI negative for acute change- ? Source of aphasia.  Pt at this time has difficulty with basic communication.  Will benefit from follow up SlP to maximize cognitive linguistic skills and decrease caregiver burden.    Educated pt/family to findings recommendations.  Instructed spouse in particular to one step commands, basic y/n as initial goals.      SLP Assessment  SLP Recommendation/Assessment: Patient needs continued Speech Lanaguage Pathology Services SLP Visit Diagnosis: Aphasia (R47.01) (motor planning deficits)    Follow Up Recommendations  Inpatient Rehab    Frequency and Duration min 2x/week  2 weeks      SLP Evaluation Cognition  Overall Cognitive Status: Impaired/Different from baseline Arousal/Alertness: Awake/alert Orientation Level: Oriented to place;Oriented to person (pt able to choose correct place from choice of 2) Attention: Sustained Memory:  (difficult to assess, able to identify spouse (via turning to look at husband)) Awareness:  (uncertain to consistency of awareness)       Comprehension  Auditory Comprehension Overall Auditory Comprehension: Impaired Yes/No Questions: Impaired Basic Biographical Questions: 76-100% accurate Basic Immediate Environment Questions: 50-74% accurate Complex Questions: 0-24% accurate Commands: Impaired One Step Basic Commands: 75-100% accurate (3/4) Two Step Basic Commands: 0-24% accurate Conversation: Simple Interfering Components: Motor planning;Processing speed EffectiveTechniques: Repetition;Extra processing time Visual Recognition/Discrimination Discrimination: Not tested Reading Comprehension Reading Status: Impaired Word level: Impaired (did not identify her gma name from written choice of two) Sentence Level: Not tested Paragraph Level: Not tested Functional Environmental (signs, name badge): Not  tested    Expression Expression Primary Mode of Expression: Other (comment)  (nonverbal, stereotypical utterance x1) Verbal Expression Overall Verbal Expression: Impaired Initiation: Impaired Automatic Speech: Counting (0 attempts with 3 opportunities, pt able to use hand motion for rhythm however) Repetition: Impaired Level of Impairment:  (sound level, pt did not approximate bilabial position) Naming:  (from yes/no = able to identify 2 items 100% accurate with verbal choice of two) Pragmatics: No impairment Non-Verbal Means of Communication: Not applicable Written Expression Dominant Hand: Right Written Expression: Exceptions to [facility] Trace Ability: Word (traced her name correctly)   Oral / Motor  Oral Motor/Sensory Function Overall Oral Motor/Sensory Function: Other (comment) Motor Speech Phonation: Normal Articulation: Within functional limitis Intelligibility: Intelligible (intelligible when spoke x2) Motor Speech Errors: Groping for words;Inconsistent;Aware (pt attempted to articulate x3, did verbalize "I'm alright" "I'm ok", did not attempt automatic speech tasks nor approximate bilabial placement)   GO                    Donavan Burnet, MS [facility] SLP [phone]

## 2016-06-22 NOTE — Progress Notes (Signed)
PT Cancellation Note  Patient Details Name: KENIDEE CREGAN MRN: [id number] DOB: [birth year]   Cancelled Treatment:    Reason Eval/Treat Not Completed: Patient at procedure or test/unavailable. Pt off floor at this time for procedure. Will re-attempt later today as time allows vs another date.    Sallyanne Kuster, PTA, CLT Acute Rehab Services [phone] 06/22/16, 9:34 AM

## 2016-06-22 NOTE — Progress Notes (Signed)
*  PRELIMINARY RESULTS* Vascular Ultrasound Carotid Duplex (Doppler) has been completed.  Preliminary findings: Technically difficult due to depth of vessels. 1-39% right ICA stenosis. 60-79% left ICA stenosis- difficult to clearly visualize plaque formation at this area.    Farrel Demark, RDMS, RVT  06/22/2016, 2:45 PM

## 2016-06-22 NOTE — Progress Notes (Signed)
Pharmacy Antibiotic Note  Bridget Johnston is a 56 y.o. female admitted on 06/21/2016 with trouble with speech and AMS.  Now with low grade fever and Pharmacy has been consulted for acyclovir dosing for r/o CNS infection.  Patietn's renal function is stable.   Plan: - Acyclovir  IV Q8H (~10 mg/kg using AdjBW) - Monitor renal fxn, clinical progress - Consider checking free T4 to evaluate thyroid function   Height:  (160 cm) Weight: 225 lb (102.1 kg) IBW/kg (Calculated) : 52.4  AdjBW = 72 kg  Temp (24hrs), Avg:99.3 F (37.4 C), Min:98.6 F (37 C), Max:100.4 F (38 C)   Recent Labs Lab 06/21/16 0134 06/21/16 0150  WBC 7.2  --   CREATININE 0.94 0.90    Estimated Creatinine Clearance: 79.7 mL/min (by C-G formula based on SCr of 0.9 mg/dL).    Allergies  Allergen Reactions  . Prednisone Hypertension    Acyclovir 4/16 >>    Miosotis Wetsel D. Laney Potash, PharmD, BCPS Pager:  [phone] 06/22/2016, 10:50 AM

## 2016-06-22 NOTE — Procedures (Signed)
ELECTROENCEPHALOGRAM REPORT  Date of Study: 06/22/2016  Patient's Name: Bridget Johnston MRN: [id number] Date of Birth: 04/30/1960  Referring Provider: Ritta Slot, MD  Clinical History: 56 year old woman with altered mental status.  Medications: Gabapentin vilazodone Acetaminophen Atorvastatin Aspirin puprenorphine Enoxaparin Labetalol Metoprolol lisinopril  Technical Summary: A multichannel digital EEG recording measured by the international 10-20 system with electrodes applied with paste and impedances below 5000 ohms performed as portable with EKG monitoring in an awake and drowsy patient.  Hyperventilation and photic stimulation were not performed.  The digital EEG was referentially recorded, reformatted, and digitally filtered in a variety of bipolar and referential montages for optimal display.   Description: The patient is awake and drowsy during the recording.  During maximal wakefulness, there is a asymmetric, medium voltage 9 Hz posterior dominant rhythm on the right that attenuates with eye opening.  There is no discernible posterior dominant rhythm on the left. This is admixed with diffuse 4-5 Hz theta and 2-3 Hz delta slowing of the waking background.  There is superimposed left hemispheric slowing. During drowsiness, there is an increase in theta slowing of the background.  Stage 2 sleep was not seen.  There were no epileptiform discharges or electrographic seizures seen.    EKG lead was unremarkable.  Impression: This awake and drowsy EEG is abnormal due to diffuse slowing and superimposed left hemispheric slowing of the waking background.  Clinical Correlation of the above findings indicates focal abnormality in the left hemisphere, as well as diffuse cerebral dysfunction that is non-specific in etiology and can be seen with hypoxic/ischemic injury, toxic/metabolic encephalopathies, neurodegenerative disorders, or medication effect.  The absence of  epileptiform discharges does not rule out a clinical diagnosis of epilepsy.  Clinical correlation is advised.   Shon Millet, DO

## 2016-06-23 ENCOUNTER — Inpatient Hospital Stay (HOSPITAL_COMMUNITY): Payer: 59

## 2016-06-23 DIAGNOSIS — G0490 Encephalitis and encephalomyelitis, unspecified: Secondary | ICD-10-CM

## 2016-06-23 LAB — GLUCOSE, CAPILLARY
GLUCOSE-CAPILLARY: 90 mg/dL (ref 65–99)
GLUCOSE-CAPILLARY: 93 mg/dL (ref 65–99)
Glucose-Capillary: 104 mg/dL — ABNORMAL HIGH (ref 65–99)
Glucose-Capillary: 89 mg/dL (ref 65–99)
Glucose-Capillary: 102 mg/dL — ABNORMAL HIGH (ref 65–99)

## 2016-06-23 LAB — CBC
HCT: 33.4 % — ABNORMAL LOW (ref 36.0–46.0)
Hemoglobin: 11.2 g/dL — ABNORMAL LOW (ref 12.0–15.0)
MCH: 31.4 pg (ref 26.0–34.0)
MCHC: 33.5 g/dL (ref 30.0–36.0)
MCV: 93.6 fL (ref 78.0–100.0)
Platelets: 228 10*3/uL (ref 150–400)
RBC: 3.57 MIL/uL — AB (ref 3.87–5.11)
RDW: 12.8 % (ref 11.5–15.5)
WBC: 5.1 10*3/uL (ref 4.0–10.5)

## 2016-06-23 LAB — VAS US CAROTID
LCCAPDIAS: 32 cm/s
LCCAPSYS: 137 cm/s
LEFT ECA DIAS: -20 cm/s
LEFT VERTEBRAL DIAS: 22 cm/s
LICADDIAS: -33 cm/s
Left CCA dist dias: -32 cm/s
Left CCA dist sys: -106 cm/s
Left ICA dist sys: -107 cm/s
Left ICA prox dias: -82 cm/s
Left ICA prox sys: -258 cm/s
RCCADSYS: -120 cm/s
RCCAPDIAS: 13 cm/s
RIGHT ECA DIAS: -13 cm/s
RIGHT VERTEBRAL DIAS: 21 cm/s
Right CCA prox sys: 93 cm/s

## 2016-06-23 LAB — CSF CELL COUNT WITH DIFFERENTIAL
RBC COUNT CSF: 18 /mm3 — AB
TUBE #: 1
WBC CSF: 0 /mm3 (ref 0–5)

## 2016-06-23 LAB — T3, FREE: T3, Free: 2.5 pg/mL (ref 2.0–4.4)

## 2016-06-23 LAB — BASIC METABOLIC PANEL
Anion gap: 9 (ref 5–15)
BUN: 6 mg/dL (ref 6–20)
CHLORIDE: 105 mmol/L (ref 101–111)
CO2: 25 mmol/L (ref 22–32)
Calcium: 8.9 mg/dL (ref 8.9–10.3)
Creatinine, Ser: 0.6 mg/dL (ref 0.44–1.00)
GFR calc Af Amer: >60 mL/min (ref >60)
GLUCOSE: 106 mg/dL — AB (ref 65–99)
POTASSIUM: 4.3 mmol/L (ref 3.5–5.1)
Sodium: 139 mmol/L (ref 135–145)

## 2016-06-23 LAB — GLUCOSE, CSF: Glucose, CSF: 62 mg/dL (ref 40–70)

## 2016-06-23 LAB — PROTEIN, CSF: Total  Protein, CSF: 11 mg/dL — ABNORMAL LOW (ref 15–45)

## 2016-06-23 MED ORDER — LIDOCAINE HCL (PF) 1 % IJ SOLN: 10.0000 mL | Freq: Once | INTRAMUSCULAR | Status: DC

## 2016-06-23 MED ORDER — LIDOCAINE HCL 1 % IJ SOLN
INTRAMUSCULAR | Status: AC
  Administered 2016-06-23: 9 mL via INTRATHECAL
  Filled 2016-06-23: qty 10

## 2016-06-23 NOTE — Progress Notes (Signed)
Rehab admissions - Noted OT recommending outpatient therapy.  PT evaluation is pending.  Doubt she will need inpatient rehab admission.  I will follow along for now.  Call me for questions.  #[phone]

## 2016-06-23 NOTE — Clinical Social Work Note (Signed)
CSW met with pt at bedside to address verbal consult for SNF placement as CIR backup. Spouse-Aaron and dtr-Sarah present at bedside. Pt refused SNF and stated she wants to go home with [facility]. Spouse and dtr able to provide 24/7 supervision. RNCM aware and will follow for d/c needs. CSW signing off as no further needs identified. Please reconsult if new SW needs arise.   Bridget Johnston, [place], [place] [facility] [phone]

## 2016-06-23 NOTE — Progress Notes (Signed)
Subjective: Speech is much better today she is talking and clear fluent sentences and understanding without a difficulty. Patient feels much better  Exam: Vitals:   06/23/16 0111 06/23/16 0512  BP: 113/68 118/72  Pulse: 78 85  Resp: 18 18  Temp: 98.1 F (36.7 C) 98.3 F (36.8 C)    HEENT-  Normocephalic, no lesions, without obvious abnormality.  Normal external eye and conjunctiva.  Normal TM's bilaterally.  Normal auditory canals and external ears. Normal external nose, mucus membranes and septum.  Normal pharynx.    Neuro:  CN: Pupils are equal and round. They are symmetrically reactive from 3-->2 mm. EOMI without nystagmus. Facial sensation is intact to light touch. Face is symmetric at rest with normal strength and mobility. Hearing is intact to conversational voice. Palate elevates symmetrically and uvula is midline. Voice is normal in tone, pitch and quality. Bilateral SCM and trapezii are 5/5. Tongue is midline with normal bulk and mobility.  Motor: Normal bulk, tone, and strength. 5/5 throughout. No drift.  Sensation: Intact to light touch.  DTRs: 2+, symmetric  Toes downgoing bilaterally. No pathologic reflexes.  Coordination: Finger-to-nose and heel-to-shin are without dysmetria     Pertinent Labs/Diagnostics: LP to be attempted today under CIR. We'll add also to LP VZV IgG G, VZV IgM, VZV PCR. I've called the lab and I've called the phlebotomy team total at them know this is to be CSF and not serum  Felicie Morn PA-C [facility] [phone]  Impression:  Concern is for possible HSV encephalitis. LP to be under fluoroscopy today, in the meantime will continue to cover patient with acyclovir.Other potential etiologies for encephalitis include other viruses (VZV, HIV, influenza; not likely arbovirus or tick-borne infection at this time given recent weather; not chronically immunosuppressed but was recently in glucocorticoids in the setting of pneumonia),  connective tissue disease, Lyme disease (daughter reports h/o Lyme dz but no recent exposure), autoimmune encephalitis (anti-LGI-1, anti-NMDAR, anti-AMPAR), paraneoplastic (anti-LGI-1, anti-Hu, anti-Ma, anti-Ta). Fungal infection would be much less likely.   Await LP, CSF to be sent for cell count with differential tubes 1 and 4, protein, glucose, Gram stain, culture, HSV PCR, VZV PCR, VZV IgG/IgM. We will check ANA, Lyme titers. Additional studies may be sent depending upon CSF results. HIV is nonreactive.        06/23/2016, 9:14 AM

## 2016-06-23 NOTE — Progress Notes (Signed)
PROGRESS NOTE    Bridget Johnston  ZOX:[id number] DOB: Jun 30, 1960 DOA: 06/21/2016 PCP: Tandy Gaw, PA-C   Brief Narrative: 57 year old female with history of dyslipidemia, hypertension, diabetes, on chronic Suboxone brought in by patient's husband for the evaluation of altered mental status including difficulty speaking. On arrival code stroke was activated and evaluated by neurologist.  She was recently hospitalized in [place] with respiratory failure due to pneumonia. She was intubated and discharged on prednisone.As per family, pt developed allergy to prednisone.   Assessment & Plan:   # Presumed acute ischemic stroke on admission, which is ruled out. Patient had global aphasia on admission which is improved now. Presumed encephalitis as per neurologist, likely viral.  -Evaluated by neurologist, failed bedside LP on 4/16, plan for fluoroscopy-guided LP today. Sending serology for encephalitis by neurologist. Patient was started empirically on IV acyclovir for presumed encephalitis. Follow-up with test results and neurologist evaluation. I discussed with the neurology team today. -repeat MRI of the brain possibility of seizure or infectious -EEG with focal abnormalities in the left hemisphere which is possibility of encephalopathies. No epileptic focus. -Echocardiogram with EF of 60-65%. Normal wall motion, grade 2 diastolic dysfunction -carotid ultrasound with right ICA 1-39% and left 60-79 % stenosis, follow up neurologist planned. Patient is on aspirin and statin. May need outpatient follow-up with vascular surgeon.  Patient has significant clinical improvement today. Her Aphasia improved. She has no focal neurological deficit. Continue current management, PT OT evaluation. Rehabilitation consult was placed.  Ammonia level, vitamin B12 unremarkable. Patient has low TSH with normal free T3 and free T4. Recommended outpatient follow-up with PCP and repeating thyroid function tests.  #  mild fever unknown etiology ? Related encephalitis:  UA negative for UTI. CXR with no acute finding.   # Hypertension: Continue current medications. Monitor blood pressure.  #History of opiate dependent: continue buprenorphine  # Anxiety depression: Continue home medications.  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 Plan for lumbar puncture. Code Status:full Family Communication: Discussed with the patient's husband at bedside in detail. Disposition Plan:Likely discharge to home versus rehabilitation in 1-2 days    Consultants:   Neurologist  Inpatient rehabilitation  Procedures:MRI lumbar puncture Antimicrobials: IV acyclovir since 06/22/2016  Subjective: Patient was seen and examined at bedside. She was alert awake and talking. Significant clinical improvement. Denied headache, dizziness, nausea, vomiting, chest pain or shortness of breath.  Objective: Vitals:   06/23/16 0111 06/23/16 0512 06/23/16 0903 06/23/16 1028  BP: 113/68 118/72  135/71  Pulse: 78 85  84  Resp: Temp: 98.1 F (36.7 C) 98.3 F (36.8 C)  98.9 F (37.2 C)  TempSrc: Oral Oral  Oral  SpO2: 98% 99% 97% 95%  Weight:      Height:        Intake/Output Summary (Last 24 hours) at 06/23/16 1116 Last data filed at 06/23/16 1100  Gross per 24 hour  Intake          3066.17 ml  Output                0  ml  Net          3066.17 ml   Filed Weights   06/21/16 0406 06/21/16 0625  Weight: 102.7 kg (226 lb 6.6 oz) 102.1 kg (225 lb)    Examination:  General exam: Alert awake, not in distress Respiratory system: Clear bilateral.  Respiratory effort normal. No wheezing or crackle Cardiovascular system: Regular rate rhythm, S1-S2 normal. No pedal edema. Gastrointestinal system: Abdomen is nondistended, soft and nontender. Normal bowel sounds heard. Central nervous system: Alert awake, following commands, fluent speech Extremities: Symmetric 5 x 5 power. Skin: No rashes, lesions or ulcers Psychiatry: Judgement and insight appear normal. Mood & affect appropriate.     Data Reviewed: I have personally reviewed following labs and imaging studies  CBC:  Recent Labs Lab 06/21/16 0134 06/21/16 0150 06/23/16 0557  WBC 7.2  --  5.1  NEUTROABS 5.3  --   --   HGB 12.6 12.2 11.2*  HCT 36.3 36.0 33.4*  MCV 92.8  --  93.6  PLT 270  --  228   Basic Metabolic Panel:  Recent Labs Lab 06/21/16 0134 06/21/16 0150 06/23/16 0557  NA 134* 134* 139  K 3.7 3.7 4.3  CL 99* 98* 105  CO2 26  --  25  GLUCOSE 150* 146* 106*  BUN CREATININE 0.94 0.90 0.60  CALCIUM 9.2  --  8.9   GFR: Estimated Creatinine Clearance: 89.6 mL/min (by C-G formula based on SCr of 0.6 mg/dL). Liver Function Tests:  Recent Labs Lab 06/21/16 0134  AST 21  ALT 33  ALKPHOS 50  BILITOT 0.7  PROT 6.5  ALBUMIN 3.9   No results for input(s): LIPASE, AMYLASE in the last 168 hours.  Recent Labs Lab 06/22/16 0755  AMMONIA 24   Coagulation Profile:  Recent Labs Lab 06/21/16 0134  INR 0.87   Cardiac Enzymes: No results for input(s): CKTOTAL, CKMB, CKMBINDEX, TROPONINI in the last 168 hours. BNP (last 3 results) No results for input(s): PROBNP in the last 8760 hours. HbA1C:  Recent Labs  06/21/16 0603  HGBA1C 5.6   CBG:  Recent Labs Lab 06/22/16 2007 06/22/16 2217 06/22/16 2352 06/23/16 0354 06/23/16 0712  GLUCAP 99 86 111* 104* 90   Lipid Profile:  Recent Labs  06/21/16 0603  CHOL 235*  HDL 48  LDLCALC 165*  TRIG 108  CHOLHDL 4.9   Thyroid Function Tests:  Recent Labs  06/22/16 0755  06/22/16 1044  TSH 0.155*  --   FREET4  --  1.10  T3FREE  --  2.5   Anemia Panel:  Recent Labs  06/22/16 0755  VITAMINB12 1,036*   Sepsis Labs: No results for input(s): PROCALCITON, LATICACIDVEN in the last 168 hours.  No results found for this or any previous visit (from the past 240 hour(s)).       Radiology Studies: Mr Laqueta Jean YN Contrast  Result Date: 06/22/2016 CLINICAL DATA:  Meningitis due to herpes simplex. New onset changes. EXAM: MRI HEAD WITHOUT AND WITH CONTRAST TECHNIQUE: Multiplanar, multiecho pulse sequences of the brain and surrounding structures were obtained without and with intravenous contrast. CONTRAST:  20mL MULTIHANCE GADOBENATE DIMEGLUMINE 529 MG/ML IV SOLN COMPARISON:  Yesterday FINDINGS: Brain: The cortex of the posterior left cerebral hemisphere is asymmetrically FLAIR hyperintense and swollen appearing. Bilateral serpiginous FLAIR hyperintensities in posterior cerebral sulci which could be subarachnoid or vascular. Borderline asymmetric sulcal enhancement along the posterior left occipital sulci. No evidence of ventriculitis or ventricular debris. No typical pattern  HSV 1 encephalitis. No acute infarct, hemorrhage, or hydrocephalus. Negative for mass. Few FLAIR hyperintensities in the cerebral white matter, likely chronic and nonspecific. These are often from nonspecific microvascular insults. Vascular: Normal flow voids are preserved. No dural venous sinus thrombus noted. Skull and upper cervical spine: Negative Sinuses/Orbits: Right maxillary fluid level. IMPRESSION: 1. Asymmetric cortical swelling in the posterior left cerebral hemisphere which could be seizure epiphenomenon or infectious. Lumbar puncture is planned. 2. Right maxillary fluid level. Electronically Signed   By: Marnee Spring M.D.   On: 06/22/2016 13:54        Scheduled Meds: . aspirin  300 mg Rectal Daily   Or  . aspirin  325 mg Oral Daily  . atorvastatin  40 mg Oral q1800  .  buprenorphine  8 mg Sublingual Daily  . gabapentin  300 mg Oral TID  . insulin aspart  0-9 Units Subcutaneous Q4H  . metoprolol succinate  25 mg Oral Daily  . mometasone-formoterol  2 puff Inhalation BID  . omega-3 acid ethyl esters  2 g Oral BID  . Vilazodone HCl  40 mg Oral Daily   Continuous Infusions: . acyclovir    . dextrose 5 % and 0.9 % NaCl with KCl 40 mEq/L 100 mL/hr at 06/23/16 1100     LOS: 2 days    Bridget Perrault Jaynie Collins, MD [facility] Pager [phone]  If 7PM-7AM, please contact night-coverage [email or web address] Password [email or web address] 06/23/2016, 11:16 AM

## 2016-06-23 NOTE — Progress Notes (Signed)
Physical Therapy Treatment Patient Details Name: Bridget Johnston MRN: [id number] DOB: 1960-11-16 Today's Date: 06/23/2016    History of Present Illness Pt is a 56 y.o. female who presented to the ED with speech difficulty. She was recently admitted to [facility] with pneumonia requiring endotracheal intubation. The evening of arrival to ED, pt's husband noticed confusion while playing a game and increasing speech difficulty with slow responses. She has a PMH significant for hypertension, hyperlipidemia, depression with anxiety, and opiate dependence.     PT Comments    Pt met all PT goals during today's session, requiring supervision for safety for transfers and ambulating 200 feet without an AD. Her R sided neglect and expressive/receptive communication deficits from the evaluation appear improved.  Pt will benefit from continued skilled therapy at the acute level, with a focus on balance, stair management, and cognition next session.  Follow up recommendation and goals have been updated.  She will have 24 hour assistance at home and will benefit from outpatient PT to maximize her return to PLOF.   Follow Up Recommendations  Outpatient PT;Supervision/Assistance - 24 hour     Equipment Recommendations  None recommended by PT    Recommendations for Other Services       Precautions / Restrictions Precautions Precautions: Fall Restrictions Weight Bearing Restrictions: No    Mobility  Bed Mobility Overal bed mobility: Modified Independent Bed Mobility: Supine to Sit     Supine to sit: Modified independent (Device/Increase time)     General bed mobility comments: increased time required to get to EOB.  Head of bed raised and bedrail up.  Transfers Overall transfer level: Needs assistance Equipment used: None Transfers: Sit to/from Stand Sit to Stand: Supervision         General transfer comment: Pt stood from EOB without assist without unsteadiness, LOB, or use of hands before  being instructed.  Supervision for safety.  Ambulation/Gait Ambulation/Gait assistance: Supervision Ambulation Distance (Feet): 200 Feet Assistive device: None Gait Pattern/deviations: Step-through pattern;Narrow base of support Gait velocity: normal Gait velocity interpretation: at or above normal speed for age/gender General Gait Details: Supervision for safety initially. Pt initially held L arm at waist until cued for natural arm swing. Pt states she feels she is walking as she did prior to admission.   Stairs            Wheelchair Mobility    Modified Rankin (Stroke Patients Only) Modified Rankin (Stroke Patients Only) Pre-Morbid Rankin Score: No symptoms Modified Rankin: Moderately severe disability     Balance Overall balance assessment: Needs assistance Sitting-balance support: No upper extremity supported;Feet supported Sitting balance-Leahy Scale: Good     Standing balance support: No upper extremity supported Standing balance-Leahy Scale: Good                              Cognition Arousal/Alertness: Awake/alert Behavior During Therapy: WFL for tasks assessed/performed Overall Cognitive Status: Impaired/Different from baseline (Pt and family state she is slowly returning to baseline) Area of Impairment: Memory;Safety/judgement;Following commands;Awareness;Problem solving                     Memory: Decreased short-term memory Following Commands: Follows one step commands with increased time Safety/Judgement: Decreased awareness of safety;Decreased awareness of deficits Awareness: Emergent Problem Solving: Slow processing;Requires verbal cues General Comments: Pt with difficulty with serial 7's and able to only complete first round. Initially decreased short-term memory but improved with mental  rehearsal strategies. Pt and family feel that she is not back to baseline yet but is showing steady improvement.      Exercises       General Comments        Pertinent Vitals/Pain Pain Assessment: No/denies pain Pain Intervention(s): Monitored during session    Home Living                      Prior Function            PT Goals (current goals can now be found in the care plan section) Acute Rehab PT Goals Patient Stated Goal: to go home PT Goal Formulation: With patient Time For Goal Achievement: 06/30/16 Potential to Achieve Goals: Good Progress towards PT goals: Goals met and updated - see care plan    Frequency    Min 4X/week      PT Plan Discharge plan needs to be updated    Co-evaluation             End of Session Equipment Utilized During Treatment: Gait belt Activity Tolerance: Patient tolerated treatment well Patient left: in chair;with call bell/phone within reach;with chair alarm set;with family/visitor present (husband present) Nurse Communication: Mobility status PT Visit Diagnosis: Other abnormalities of gait and mobility (R26.89);Other symptoms and signs involving the nervous system (R29.898)     Time: 1040-1055 PT Time Calculation (min) (ACUTE ONLY): 15 min  Charges:  $Gait Training: 8-22 mins                    G Codes:       Gaetano Net SPT   Gaetano Net 06/23/2016, 3:11 PM

## 2016-06-23 NOTE — Progress Notes (Signed)
Occupational Therapy Treatment Patient Details Name: Bridget Johnston MRN: [id number] DOB: January 29, 1961 Today's Date: 06/23/2016    History of present illness Pt is a 56 y.o. female who presented to the ED with speech difficulty. She was recently admitted to [facility] with pneumonia requiring endotracheal intubation. The evening of arrival to ED, pt's husband noticed confusion while playing a game and increasing speech difficulty with slow responses. She has a PMH significant for hypertension, hyperlipidemia, depression with anxiety, and opiate dependence.    OT comments  Pt demonstrates significant progress toward OT goals this session. She has met all original OT goals and updated care plans accordingly. Pt able to complete overall basic ADL with supervision for safety this session. She does demonstrate decreased higher level cognitive skills this session including delayed recall and educated pt and family concerning compensatory strategies as detailed below. Pt will have 24 hour assistance available at home from family as well as transportation and feel that she would benefit from outpatient OT services to address cognition for IADL and work related tasks. OT will continue to follow while admitted.   Follow Up Recommendations  Outpatient OT;Supervision/Assistance - 24 hour    Equipment Recommendations  None recommended by OT    Recommendations for Other Services      Precautions / Restrictions Precautions Precautions: Fall Restrictions Weight Bearing Restrictions: No       Mobility Bed Mobility               General bed mobility comments: OOB in chair on OT arrival.  Transfers Overall transfer level: Needs assistance Equipment used: None Transfers: Sit to/from Stand Sit to Stand: Supervision         General transfer comment: Supervision for safety.    Balance Overall balance assessment: Needs assistance Sitting-balance support: No upper extremity supported;Feet  supported Sitting balance-Leahy Scale: Good     Standing balance support: No upper extremity supported;During functional activity Standing balance-Leahy Scale: Good                             ADL either performed or assessed with clinical judgement   ADL Overall ADL's : Needs assistance/impaired     Grooming: Supervision/safety;Standing               Lower Body Dressing: Supervision/safety;Sit to/from stand   Toilet Transfer: Supervision/safety;Ambulation   Toileting- Clothing Manipulation and Hygiene: Supervision/safety;Sit to/from stand       Functional mobility during ADLs: Supervision/safety General ADL Comments: Pt able to ambulate in hallway and complete simultaneous cognitive tasks. Able to count by two's in moderately distracting environment but unable to complete serial 7's. Educated on strategies to compensate for decreased short-term memory such as list-making and mental rehearsal.     Vision   Vision Assessment?: Yes Eye Alignment: Within Functional Limits Ocular Range of Motion: Within Functional Limits Alignment/Gaze Preference: Within Defined Limits Tracking/Visual Pursuits: Able to track stimulus in all quads without difficulty Saccades: Within functional limits Convergence: Within functional limits Visual Fields: No apparent deficits Additional Comments: Able to read signs on wall with glasses on. R gaze preference no longer present.   Perception     Praxis      Cognition Arousal/Alertness: Lethargic Behavior During Therapy: Flat affect Overall Cognitive Status: Impaired/Different from baseline Area of Impairment: Problem solving;Following commands                       Following Commands:  Follows multi-step commands inconsistently;Follows multi-step commands with increased time     Problem Solving: Slow processing;Requires verbal cues General Comments: Pt with difficulty with serial 7's and able to only complete first  round. Initially decreased short-term memory but improved with mental rehearsal strategies. Pt and family feel that she is not back to baseline yet but is showing steady improvement.        Exercises     Shoulder Instructions       General Comments      Pertinent Vitals/ Pain       Pain Assessment: No/denies pain  Home Living                                          Prior Functioning/Environment              Frequency  Min 2X/week        Progress Toward Goals  OT Goals(current goals can now be found in the care plan section)  Progress towards OT goals: Goals met and updated - see care plan  Acute Rehab OT Goals Patient Stated Goal: to go home OT Goal Formulation: With patient/family Time For Goal Achievement: 07/05/16 Potential to Achieve Goals: Good ADL Goals Pt Will Perform Grooming: with modified independence;standing Pt Will Perform Upper Body Dressing: with supervision;sitting Pt Will Perform Lower Body Dressing: with min assist;sit to/from stand Pt Will Transfer to Toilet: with modified independence;ambulating;regular height toilet Pt Will Perform Toileting - Clothing Manipulation and hygiene: with modified independence;sit to/from stand Pt Will Perform Tub/Shower Transfer: with modified independence;ambulating Pt/caregiver will Perform Home Exercise Program: Increased strength;Right Upper extremity;With Supervision;With written HEP provided Additional ADL Goal #1: Pt will complete simple financial management task with supervision in order to demonstrate improved IADL independence.  Plan Discharge plan needs to be updated    Co-evaluation                 End of Session Equipment Utilized During Treatment: Gait belt  OT Visit Diagnosis: Muscle weakness (generalized) (M62.81);Cognitive communication deficit (R41.841)   Activity Tolerance Patient tolerated treatment well   Patient Left in chair;with call bell/phone within  reach;with family/visitor present   Nurse Communication Mobility status        Time: 8316-7425 OT Time Calculation (min): 16 min  Charges: OT General Charges $OT Visit: 1 Procedure OT Treatments $Self Care/Home Management : 8-22 mins  Norman Herrlich, MS OTR/L  Pager: [place] [facility] 06/23/2016, 1:16 PM

## 2016-06-23 NOTE — Progress Notes (Signed)
Pt returned from interventional radiology at this time.  No complaints at present.  Flat in bed.  Denies headache.  Family at bedside.

## 2016-06-24 LAB — BASIC METABOLIC PANEL
ANION GAP: 6 (ref 5–15)
BUN: <5 mg/dL — ABNORMAL LOW (ref 6–20)
CO2: 28 mmol/L (ref 22–32)
Calcium: 9 mg/dL (ref 8.9–10.3)
Chloride: 108 mmol/L (ref 101–111)
Creatinine, Ser: 0.62 mg/dL (ref 0.44–1.00)
GFR calc Af Amer: >60 mL/min (ref >60)
Glucose, Bld: 106 mg/dL — ABNORMAL HIGH (ref 65–99)
POTASSIUM: 4.5 mmol/L (ref 3.5–5.1)
SODIUM: 142 mmol/L (ref 135–145)

## 2016-06-24 LAB — LYME DISEASE DNA BY PCR(BORRELIA BURG): Lyme Disease(B.burgdorferi)PCR: NEGATIVE

## 2016-06-24 LAB — HERPES SIMPLEX VIRUS(HSV) DNA BY PCR
HSV 1 DNA: NEGATIVE
HSV 2 DNA: NEGATIVE

## 2016-06-24 LAB — GLUCOSE, CAPILLARY
GLUCOSE-CAPILLARY: 102 mg/dL — AB (ref 65–99)
GLUCOSE-CAPILLARY: 131 mg/dL — AB (ref 65–99)
GLUCOSE-CAPILLARY: 83 mg/dL (ref 65–99)
GLUCOSE-CAPILLARY: 96 mg/dL (ref 65–99)
GLUCOSE-CAPILLARY: 96 mg/dL (ref 65–99)
Glucose-Capillary: 128 mg/dL — ABNORMAL HIGH (ref 65–99)

## 2016-06-24 LAB — B. BURGDORFI ANTIBODIES

## 2016-06-24 LAB — VDRL, CSF: VDRL Quant, CSF: NONREACTIVE

## 2016-06-24 LAB — ANTINUCLEAR ANTIBODIES, IFA: ANA Ab, IFA: NEGATIVE

## 2016-06-24 LAB — VARICELLA ZOSTER ANTIBODY, IGG: Varicella IgG: 269 index (ref >165)

## 2016-06-24 NOTE — Progress Notes (Signed)
Physical Therapy Treatment and Discharge Patient Details Name: ARVIS MIGUEZ MRN: [id number] DOB: 1960/09/01 Today's Date: 06/24/2016    History of Present Illness Pt is a 56 y.o. female who presented to the ED with speech difficulty. She was recently admitted to [facility] with pneumonia requiring endotracheal intubation. The evening of arrival to ED, pt's husband noticed confusion while playing a game and increasing speech difficulty with slow responses. She has a PMH significant for hypertension, hyperlipidemia, depression with anxiety, and opiate dependence.     PT Comments    Pt has met PT goals and is safe to return home from a PT standpoint.  She scored 20/24 on the Dynamic Gait Index today, indicating mild balance deficits.  During gait training pt was asked to identify items to the right to assess previously noted right sided neglect.  Right peripheral field was also tested.  No notable deficits were identified. She will benefit from OPPT to address endurance, balance, and functional mobility in order to return to PLOF.  Acute PT is signing off but please contact if further assistance is needed.   Follow Up Recommendations  Outpatient PT;Supervision/Assistance - 24 hour     Equipment Recommendations  None recommended by PT    Recommendations for Other Services       Precautions / Restrictions Precautions Precautions: Fall Restrictions Weight Bearing Restrictions: No    Mobility  Bed Mobility Overal bed mobility: Modified Independent Bed Mobility: Supine to Sit     Supine to sit: Modified independent (Device/Increase time)     General bed mobility comments: increased time required to get to EOB.  Head of bed raised and bedrail up.  Transfers Overall transfer level: Modified independent Equipment used: None Transfers: Sit to/from Stand Sit to Stand: Modified independent (Device/Increase time)         General transfer comment: Pt used handrail on R side and no  support on L for power up.  Ambulation/Gait Ambulation/Gait assistance: Modified independent (Device/Increase time) Ambulation Distance (Feet): 400 Feet Assistive device: None Gait Pattern/deviations: Step-through pattern;Narrow base of support Gait velocity: normal Gait velocity interpretation: at or above normal speed for age/gender General Gait Details: DGI performed with supervision for safety initially.  Pt initially with a stiff posture, but husband states she appeared at baseline.   Stairs Stairs: Yes   Stair Management: One rail Right;Alternating pattern;Step to pattern;Sideways Number of Stairs: 5 General stair comments: Pt ascended with alternating pattern and descended facing handrail with step to pattern sideways.  Pt overall steady on stairs with slight reliance on handrail.  Wheelchair Mobility    Modified Rankin (Stroke Patients Only) Modified Rankin (Stroke Patients Only) Pre-Morbid Rankin Score: No symptoms Modified Rankin: Moderate disability     Balance Overall balance assessment: Needs assistance Sitting-balance support: No upper extremity supported;Feet supported Sitting balance-Leahy Scale: Good     Standing balance support: No upper extremity supported Standing balance-Leahy Scale: Good                   Standardized Balance Assessment Standardized Balance Assessment : Dynamic Gait Index   Dynamic Gait Index Level Surface: Normal Change in Gait Speed: Normal Gait with Horizontal Head Turns: Mild Impairment Gait with Vertical Head Turns: Normal Gait and Pivot Turn: Normal Step Over Obstacle: Mild Impairment Step Around Obstacles: Moderate Impairment Steps: Mild Impairment Total Score: 19      Cognition Arousal/Alertness: Awake/alert Behavior During Therapy: WFL for tasks assessed/performed Overall Cognitive Status: Within Functional Limits for tasks assessed  Current Attention Level: Sustained                   Exercises      General Comments        Pertinent Vitals/Pain Pain Assessment: Faces Faces Pain Scale: Hurts a little bit Pain Location: Back; spinal tap site Pain Descriptors / Indicators:  (stinging) Pain Intervention(s): Monitored during session    Home Living                      Prior Function            PT Goals (current goals can now be found in the care plan section) Acute Rehab PT Goals Patient Stated Goal: to go home PT Goal Formulation: With patient Time For Goal Achievement: 06/30/16 Potential to Achieve Goals: Good Progress towards PT goals: Goals met/education completed, patient discharged from PT    Frequency           PT Plan Discharge plan needs to be updated;Frequency needs to be updated    Co-evaluation             End of Session Equipment Utilized During Treatment: Gait belt Activity Tolerance: Patient tolerated treatment well Patient left: in bed;with nursing/sitter in room;with family/visitor present Nurse Communication: Mobility status PT Visit Diagnosis: Other abnormalities of gait and mobility (R26.89);Other symptoms and signs involving the nervous system (R29.898)     Time: 0762-2633 PT Time Calculation (min) (ACUTE ONLY): 19 min  Charges:                       G Codes:       Gaetano Net SPT   Gaetano Net 06/24/2016, 1:10 PM

## 2016-06-24 NOTE — Progress Notes (Signed)
PROGRESS NOTE  Bridget Johnston ZOX:[id number] DOB: 10/25/1960 DOA: 06/21/2016 PCP: Tandy Gaw, PA-C   LOS: 3 days   Brief Narrative: 56 year old female with history of dyslipidemia, hypertension, diabetes, on chronic Suboxone brought in by patient's husband for the evaluation of altered mental status including difficulty speaking on 4/15. On arrival code stroke was activated and evaluated by neurologist.  MR of brain on 4/16 showed abnormalities in the posterior left hemisphere that could be d/t to seizure or infectious process. EEG was also done on 4/16, did not show seizure, but did show abnormality in left hemisphere that could be d/t hypoxic/ischemic injury, toxic/metabolic encephalopathies, neurodegenerative disorders, or medication effect. On 4/15 evening, pt was noted to have a fever of 100.4.   Pt believed to have acute encephalopathy and LP was ordered on 4/16. After one unsuccessful LP, CSF was finally obtained on 4/17 with aid of fluoroscopy. LP results pending.   She was hospitalized last month at [facility] with respiratory failure due to pneumonia. She was intubated and discharged on prednisone. As per family, pt developed allergy to prednisone.   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 No epileptic focus. -Presumed encephalitis as per neurologist, likely viral.  -Evaluated by neurologist, failed  bedside LP on 4/16, fluoroscopy-guided LP done 4/17. Results pending. 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  -Follow-up with test results and neurologist evaluation.  Acute expressive aphasia, suspected ischemic CVA -ruled out.  -Patient had global aphasia on admission which has significantly improved now. -MRI did not show any cerebral ischemia/infarcts  Chronic systolic HF -Echocardiogram with EF of 60-65%. Normal wall motion, grade 2 diastolic dysfunction. Improvement from 30-35% LVEF a month ago at [facility]. Low EF back then likely d/t cardiopulmonary stressors. Continue metoprolol, lisinopril being held. Continue both home meds after d/c. -carotid ultrasound with right ICA 1-39% and left 60-79 % stenosis, follow up neurologist planned. Patient is on aspirin and statin. May need outpatient follow-up with vascular surgeon.  Hypertension -Continue current medications. Monitor blood pressure.  Opiate dependence  -Continue buprenorphine with bowel regimen   Anxiety/depression -Continue home medications.  TSH abnormality -Patient has low TSH with normal free T3 and free T4. Recommended outpatient follow-up with PCP and repeating thyroid function tests.   DVT prophylaxis: SCD Code Status: FULL Family Communication: Husband at bedside Disposition Plan: Home following LP results; if viral encephalitis, make arrangements for IV abx at home  Consultants:   Neurologist  Inpatient rehabilitation   Procedures:   Attempted LP 4/16, Successful fluoroscopy-guided LP 4/17  Echo: 4/16  EEG: 4/16  US Carotid doppler: 4/16  Antimicrobials:  IV acyclovir 06/22/17-->current  Subjective: Patient was alert, awake, oriented, and talking today. Continues to show clinical improvement. Aphasia shows marked improvement, both patient and husband have also noticed progression. Denies leg swelling, N/V, weakness, dizziness, CP or SOB.  Pt refused rehab  facility, would like to be d/c  home with HH at time of dispo.  Objective: Vitals:   06/24/16 0101 06/24/16 0545 06/24/16 0816 06/24/16 0905  BP: 108/61 (!) 116/57  119/64  Pulse: 73 87  98  Resp: Temp: 98.2 F (36.8 C) 98.1 F (36.7 C)  98.2 F (36.8 C)  TempSrc: Oral Oral  Oral  SpO2: 97% 96% 96% 96%  Weight:      Height:        Intake/Output Summary (Last 24 hours) at 06/24/16 1107 Last data filed at 06/23/16 1900  Gross per 24 hour  Intake              880 ml  Output                0 ml  Net              880 ml   Filed Weights   06/21/16 0406 06/21/16 0625  Weight: 102.7 kg (226 lb 6.6 oz) 102.1 kg (225 lb)    Examination: Constitutional: NAD Vitals:   06/24/16 0101 06/24/16 0545 06/24/16 0816 06/24/16 0905  BP: 108/61 (!) 116/57  119/64  Pulse: 73 87  98  Resp: Temp: 98.2 F (36.8 C) 98.1 F (36.7 C)  98.2 F (36.8 C)  TempSrc: Oral Oral  Oral  SpO2: 97% 96% 96% 96%  Weight:      Height:       Eyes: PERRL Respiratory: clear to auscultation bilaterally, no wheezing, no crackles. Normal respiratory effort. No accessory muscle use.  Cardiovascular: Regular rate and rhythm, no murmurs / rubs / gallops. No LE edema. 2+ pedal pulses. Musculoskeletal/Neuro: Normal muscle tone. Strength 5/5 in all 4.  Skin: no obvious rashes, lesions, ulcers Psychiatric: Normal judgment and insight. Alert and oriented x 3. Normal mood.    Data Reviewed: I have personally reviewed following labs and imaging studies  CBC:  Recent Labs Lab 06/21/16 0134 06/21/16 0150 06/23/16 0557  WBC 7.2  --  5.1  NEUTROABS 5.3  --   --   HGB 12.6 12.2 11.2*  HCT 36.3 36.0 33.4*  MCV 92.8  --  93.6  PLT 270  --  228   Basic Metabolic Panel:  Recent Labs Lab 06/21/16 0134 06/21/16 0150 06/23/16 0557 06/24/16 0650  NA 134* 134* 139 142  K 3.7 3.7 4.3 4.5  CL 99* 98* 105 108  CO2 26  --  25 28  GLUCOSE 150* 146* 106* 106*  BUN <5*  CREATININE  0.94 0.90 0.60 0.62  CALCIUM 9.2  --  8.9 9.0   GFR: Estimated Creatinine Clearance: 89.6 mL/min (by C-G formula based on SCr of 0.62 mg/dL). Liver Function Tests:  Recent Labs Lab 06/21/16 0134  AST 21  ALT 33  ALKPHOS 50  BILITOT 0.7  PROT 6.5  ALBUMIN 3.9   No results for input(s): LIPASE, AMYLASE in the last 168 hours.  Recent Labs Lab 06/22/16 0755  AMMONIA 24   Coagulation Profile:  Recent Labs Lab 06/21/16 0134  INR 0.87   Cardiac Enzymes: No results for input(s): CKTOTAL, CKMB, CKMBINDEX, TROPONINI in the last 168 hours. BNP (last 3 results) No results for input(s): PROBNP in the last 8760 hours. HbA1C: No results for input(s): HGBA1C in the last 72 hours. CBG:  Recent Labs Lab 06/23/16 1713 06/23/16 2008 06/24/16 0016 06/24/16 0345 06/24/16 0932  GLUCAP 93 102* 96 96 128*   Lipid Profile: No  results for input(s): CHOL, HDL, LDLCALC, TRIG, CHOLHDL, LDLDIRECT in the last 72 hours. Thyroid Function Tests:  Recent Labs  06/22/16 0755 06/22/16 1044  TSH 0.155*  --   FREET4  --  1.10  T3FREE  --  2.5   Anemia Panel:  Recent Labs  06/22/16 0755  VITAMINB12 1,036*   Urine analysis:    Component Value Date/Time   COLORURINE YELLOW 06/21/2016 1110   APPEARANCEUR HAZY (A) 06/21/2016 1110   LABSPEC 1.045 (H) 06/21/2016 1110   PHURINE 5.0 06/21/2016 1110   GLUCOSEU 50 (A) 06/21/2016 1110   HGBUR NEGATIVE 06/21/2016 1110   HGBUR 1+ 10/08/2010 0815   BILIRUBINUR NEGATIVE 06/21/2016 1110   BILIRUBINUR neg 02/07/2016 0827   KETONESUR NEGATIVE 06/21/2016 1110   PROTEINUR NEGATIVE 06/21/2016 1110   UROBILINOGEN 0.2 02/07/2016 0827   UROBILINOGEN 0.2 10/08/2010 0815   NITRITE NEGATIVE 06/21/2016 1110   LEUKOCYTESUR NEGATIVE 06/21/2016 1110   Sepsis Labs: Invalid input(s): PROCALCITONIN, LACTICIDVEN  Recent Results (from the past 240 hour(s))  CSF culture     Status: None (Preliminary result)   Collection Time: 06/23/16  3:45 PM  Result  Value Ref Range Status   Specimen Description CSF  Final   Special Requests NONE  Final   Gram Stain   Final    WBC PRESENT, PREDOMINANTLY MONONUCLEAR NO ORGANISMS SEEN CYTOSPIN SMEAR    Culture NO GROWTH < 24 HOURS  Final   Report Status PENDING  Incomplete     Radiology Studies: Mr Laqueta Jean Wo Contrast  Result Date: 06/22/2016 CLINICAL DATA:  Meningitis due to herpes simplex. New onset changes. EXAM: MRI HEAD WITHOUT AND WITH CONTRAST TECHNIQUE: Multiplanar, multiecho pulse sequences of the brain and surrounding structures were obtained without and with intravenous contrast. CONTRAST:  20mL MULTIHANCE GADOBENATE DIMEGLUMINE 529 MG/ML IV SOLN COMPARISON:  Yesterday FINDINGS: Brain: The cortex of the posterior left cerebral hemisphere is asymmetrically FLAIR hyperintense and swollen appearing. Bilateral serpiginous FLAIR hyperintensities in posterior cerebral sulci which could be subarachnoid or vascular. Borderline asymmetric sulcal enhancement along the posterior left occipital sulci. No evidence of ventriculitis or ventricular debris. No typical pattern HSV 1 encephalitis. No acute infarct, hemorrhage, or hydrocephalus. Negative for mass. Few FLAIR hyperintensities in the cerebral white matter, likely chronic and nonspecific. These are often from nonspecific microvascular insults. Vascular: Normal flow voids are preserved. No dural venous sinus thrombus noted. Skull and upper cervical spine: Negative Sinuses/Orbits: Right maxillary fluid level. IMPRESSION: 1. Asymmetric cortical swelling in the posterior left cerebral hemisphere which could be seizure epiphenomenon or infectious. Lumbar puncture is planned. 2. Right maxillary fluid level. Electronically Signed   By: Marnee Spring M.D.   On: 06/22/2016 13:54   Dg Fluoro Guide Lumbar Puncture  Result Date: 06/23/2016 CLINICAL DATA:  Encephalitis. EXAM: DIAGNOSTIC LUMBAR PUNCTURE UNDER FLUOROSCOPIC GUIDANCE FLUOROSCOPY TIME:  Fluoroscopy Time: 2  minutes 0 seconds ; low dose pulsed fluoroscopy Radiation Exposure Index (if provided by the fluoroscopic device): 41.2 mGy Number of Acquired Spot Images: 0 PROCEDURE: Informed consent was obtained from the patient prior to the procedure, including potential complications of spinal headache, bleeding and infection. With the patient prone, the lower back was prepped with Betadine. 1% Lidocaine was used for local anesthesia. Lumbar puncture was performed at the L1-2 level using a 20 gauge needle with return of clear CSF with an opening pressure of 23 cm water. 14 ml of CSF were obtained for laboratory studies. The patient tolerated the procedure well and there were no  apparent complications. IMPRESSION: Successful diagnostic lumbar puncture under fluoroscopic guidance. No immediate complication. Electronically Signed   By: Myles Rosenthal M.D.   On: 06/23/2016 16:12    Scheduled Meds: . aspirin  300 mg Rectal Daily   Or  . aspirin  325 mg Oral Daily  . atorvastatin  40 mg Oral q1800  . buprenorphine  8 mg Sublingual Daily  . gabapentin  300 mg Oral TID  . insulin aspart  0-9 Units Subcutaneous Q4H  . lidocaine (PF)  10 mL Other Once  . metoprolol succinate  25 mg Oral Daily  . mometasone-formoterol  2 puff Inhalation BID  . omega-3 acid ethyl esters  2 g Oral BID  . Vilazodone HCl  40 mg Oral Daily   Continuous Infusions: . acyclovir    . dextrose 5 % and 0.9 % NaCl with KCl 40 mEq/L 100 mL/hr at 06/23/16 2328   Floreen Comber, PA-S  Attending MD note  Patient was seen, examined,treatment plan was discussed with the PA-S Floreen Comber.  I have personally reviewed the clinical findings, lab, imaging studies and management of this patient in detail. I agree with the documentation, as recorded by the PA-S  Patient is   56 year old female with history of diabetes, hyperlipidemia, who was brought in on 4/15 for acute encephalopathy/speech difficulties.  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  Since antivirals, she has improved and is back to baseline.  On Exam: Gen. 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  If negative, can discontinue antivirals -EEG without epileptiform discharges -Echocardiogram with EF of 60-65%. Normal wall motion, grade 2 diastolic dysfunction -carotid ultrasound with right ICA 1-39% and left 60-79 % stenosis, follow up neurologist planned. Patient is on aspirin and statin. May need outpatient follow-up with vascular surgeon. -Ammonia level, vitamin B12 unremarkable. Patient has low TSH with normal free T3 and free T4. Recommended outpatient follow-up with PCP and repeating thyroid function tests.  Mild fever unknown etiology  -Fever resolved, no UTI, chest x-ray without any infiltrates  Hypertension  -Continue current medications. Monitor blood pressure.  History of opiate dependent  -continue buprenorphine  Anxiety depression  -Continue home medications.  Diabetes mellitus -A1c less than 6, ? prediabetes, diet-controlled   Rest as above  Kailee Essman M. Elvera Lennox, MD [facility] [phone]  If 7PM-7AM, please contact night-coverage [email or web address] Password [email or web address]      Pamella Pert, MD, PhD [facility] Pager [phone]  If 7PM-7AM, please contact night-coverage [email or web address] Password [facility] 06/24/2016, 11:07 AM

## 2016-06-24 NOTE — Progress Notes (Addendum)
Subjective: Patient continues to have clear speech today, good comprehension, no complaints.  Exam: Vitals:   06/24/16 0545 06/24/16 0905  BP: (!) 116/57 119/64  Pulse: 87 98  Resp: 18 20  Temp: 98.1 F (36.7 C) 98.2 F (36.8 C)       Neuro:  CN: Pupils are equal and round. They are symmetrically reactive from 3-->2 mm. EOMI without nystagmus. Facial sensation is intact to light touch. Face is symmetric at rest with normal strength and mobility. Hearing is intact to conversational voice. Palate elevates symmetrically and uvula is midline. Voice is normal in tone, pitch and quality. Bilateral SCM and trapezii are 5/5. Tongue is midline with normal bulk and mobility.  Motor: Normal bulk, tone, and strength. 5/5 throughout. No drift.  Sensation: Intact to light touch.  DTRs: 2+, symmetric  Toes downgoing bilaterally. No pathologic reflexes.  Coordination: Finger-to-nose and heel-to-shin are without dysmetria     Pertinent Labs/Diagnostics: CSF: Results for JEFFRIE, STANDER (MRN [id number]) as of 06/24/2016 11:20  Ref. Range 06/23/2016 15:45 06/23/2016 16:41  Appearance, CSF Latest Ref Range: CLEAR  CLEAR   Glucose, CSF Latest Ref Range: 40 - 70 mg/dL  62  RBC Count, CSF Latest Ref Range: 0 /cu mm 18 (H)   Lymphs, CSF Latest Ref Range: 40 - 80 % RARE   Monocyte-Macrophage-Spinal Fluid Latest Ref Range: 15 - 45 % RARE   Other Cells, CSF Unknown TOO FEW TO COUNT,...   Color, CSF Latest Ref Range: COLORLESS  COLORLESS   Supernatant Unknown NOT INDICATED   Total  Protein, CSF Latest Ref Range: 15 - 45 mg/dL  11 (L)  Tube # Unknown 1   WBC, CSF Latest Ref Range: 0 - 5 /cu mm 0     Pending CSF fluids include Lyme, VZV, HSV, influenza, an NMDA.    Impression:  Impression: 1. Encephalitis 2. Global aphasia   Recommendations: 1) at this point if the HSV PCR from CSF comes backed negative, acyclovir may be stopped and patient may be DC'd home. Still awaiting the CSF fluid for  Lyme,VZV, HSV influenza and NMDA to which can all be followed up as an outpatient. I will make an outpatient referral for the patient to be seen at low [facility] neurology as she would prefer to go to low [facility] neurology.  Felicie Morn PA-C [facility] [phone]  06/24/2016, 11:18 AM

## 2016-06-25 ENCOUNTER — Encounter: Payer: Self-pay | Admitting: Neurology

## 2016-06-25 LAB — MISC LABCORP TEST (SEND OUT)
Labcorp test code: 9985
Labcorp test code: 9985

## 2016-06-25 LAB — GLUCOSE, CAPILLARY
GLUCOSE-CAPILLARY: 95 mg/dL (ref 65–99)
GLUCOSE-CAPILLARY: 98 mg/dL (ref 65–99)
Glucose-Capillary: 109 mg/dL — ABNORMAL HIGH (ref 65–99)

## 2016-06-25 LAB — BASIC METABOLIC PANEL
ANION GAP: 8 (ref 5–15)
CHLORIDE: 108 mmol/L (ref 101–111)
CO2: 27 mmol/L (ref 22–32)
Calcium: 8.9 mg/dL (ref 8.9–10.3)
Creatinine, Ser: 0.63 mg/dL (ref 0.44–1.00)
GFR calc Af Amer: >60 mL/min (ref >60)
GLUCOSE: 102 mg/dL — AB (ref 65–99)
POTASSIUM: 4.5 mmol/L (ref 3.5–5.1)
Sodium: 143 mmol/L (ref 135–145)

## 2016-06-25 NOTE — Progress Notes (Signed)
Neurology Progress Note  She continues to do well today. She feels like her language is back to normal and her husband agrees. She also does not feel as confused any longer. She states that at one point she was not sure how to use her cell phone and could not remember simple things but now feels like all of this has come back to her. She and her husband both feel that she is back to her usual baseline.   She has no aphasia at present and her speech is clear. Her pupils are equal. Extraocular movements are intact. Her face is symmetric with normal mobility. Strength appears grossly normal. No abnormal movements observed.   Labs: CSF wbc 0, rbc 18 CSF protein  CSF VDRL negative CSF HSV PCR negative CSF Lyme PCR negative  CSF cytology negative ANA negative Lyme titers negative Vitamin B12 1036 TSH 0.155 Free T4 1.10 Free T3 2.5 HIV nonreactive Serum VZV IgG 269   Pending labs:  CSF VZV PCR pending CSF VZV IgG/IgM pending CSF influenza PCR pending Serum NMDAR antibodies pending  Impression: 1. Encephalitis: She has recovered very well with no obvious residual deficits. The etiology of her encephalitis remains unclear. CSF was not suggestive of robust inflammation but this does not exclude viral or autoimmune processes. HSV PCR was negative so acyclovir was stopped. Influenza and VZV PCRs and VZV IgG/IgM are still pending on CSF to complete viral workup. Arboviruses not tested given time of year, absence of exposure. NMDA receptor antibodies are pending--sent on serum only as not enough CSF remained for testing; her clinical picture is not consistent with this diagnosis, however. She was counseled that she may experience recrudescence of her deficits if she is overly tired or stressed or is she becomes sick with something else (including simple UTI) while she continues to recover and that this is normal. She was encouraged to rest for a few more days and then gradually return to her prior  level of function, watching for any evidence of recrudescence. If she continues to notice any cognitive issues, she may benefit from formal neuropsychological testing as an outpatient. She has an outpatient neurology follow up visit scheduled already and was encouraged to keep it.   This was discussed at length with the patient and her husband. They are in agreement with the plan as noted. They were given the chance to ask any questions and these were addressed to their satisfaction.   She is OK for discharge home from the neurology perspective. I briefly discussed this with her attending, Dr. Elvera Lennox, earlier this morning.

## 2016-06-25 NOTE — Discharge Instructions (Signed)
Follow with Tandy Gaw, PA-C in 1-2 weeks  Please get a complete blood count and chemistry panel checked by your Primary MD at your next visit, and again as instructed by your Primary MD. Please get your medications reviewed and adjusted by your Primary MD.  Please request your Primary MD to go over all Hospital Tests and Procedure/Radiological results at the follow up, please get all Hospital records sent to your Prim MD by signing hospital release before you go home.  If you had Pneumonia of Lung problems at the Hospital: Please get a 2 view Chest X ray done in 6-8 weeks after hospital discharge or sooner if instructed by your Primary MD.  If you have Congestive Heart Failure: Please call your Cardiologist or Primary MD anytime you have any of the following symptoms:  1) 3 pound weight gain in 24 hours or 5 pounds in 1 week  2) shortness of breath, with or without a dry hacking cough  3) swelling in the hands, feet or stomach  4) if you have to sleep on extra pillows at night in order to breathe  Follow cardiac low salt diet and 1.5 lit/day fluid restriction.  If you have diabetes Accuchecks 4 times/day, Once in AM empty stomach and then before each meal. Log in all results and show them to your primary doctor at your next visit. If any glucose reading is under 80 or above 300 call your primary MD immediately.  If you have Seizure/Convulsions/Epilepsy: Please do not drive, operate heavy machinery, participate in activities at heights or participate in high speed sports until you have seen by Primary MD or a Neurologist and advised to do so again.  If you had Gastrointestinal Bleeding: Please ask your Primary MD to check a complete blood count within one week of discharge or at your next visit. Your endoscopic/colonoscopic biopsies that are pending at the time of discharge, will also need to followed by your Primary MD.  Get Medicines reviewed and adjusted. Please take all your  medications with you for your next visit with your Primary MD  Please request your Primary MD to go over all hospital tests and procedure/radiological results at the follow up, please ask your Primary MD to get all Hospital records sent to his/her office.  If you experience worsening of your admission symptoms, develop shortness of breath, life threatening emergency, suicidal or homicidal thoughts you must seek medical attention immediately by calling 911 or calling your MD immediately  if symptoms less severe.  You must read complete instructions/literature along with all the possible adverse reactions/side effects for all the Medicines you take and that have been prescribed to you. Take any new Medicines after you have completely understood and accpet all the possible adverse reactions/side effects.   Do not drive or operate heavy machinery when taking Pain medications.   Do not take more than prescribed Pain, Sleep and Anxiety Medications  Special Instructions: If you have smoked or chewed Tobacco  in the last 2 yrs please stop smoking, stop any regular Alcohol  and or any Recreational drug use.  Wear Seat belts while driving.  Please note You were cared for by a hospitalist during your hospital stay. If you have any questions about your discharge medications or the care you received while you were in the hospital after you are discharged, you can call the unit and asked to speak with the hospitalist on call if the hospitalist that took care of you is not available. Once  you are discharged, your primary care physician will handle any further medical issues. Please note that NO REFILLS for any discharge medications will be authorized once you are discharged, as it is imperative that you return to your primary care physician (or establish a relationship with a primary care physician if you do not have one) for your aftercare needs so that they can reassess your need for medications and monitor your  lab values.  You can reach the hospitalist office at phone [phone] or fax [phone]   If you do not have a primary care physician, you can call [phone] for a physician referral.  Activity: As tolerated with Full fall precautions use walker/cane & assistance as needed  Diet: regular  Disposition Home

## 2016-06-25 NOTE — Discharge Summary (Signed)
Physician Discharge Summary  Bridget Johnston ZOX:[id number] DOB: [birth year] DOA: 06/21/2016  PCP: Tandy Gaw, PA-C  Admit date: 06/21/2016 Discharge date: 06/25/2016  Admitted From: home Disposition:  home  Recommendations for Outpatient Follow-up:  1. Follow up with PCP in 1-2 weeks  Home Health: PT, RN, OT  Discharge Condition: stable CODE STATUS: Full code Diet recommendation: regular  HPI: Per Dr. Antionette Char, Bridget Johnston is a 56 y.o. female with medical history significant for hypertension, hyperlipidemia, depression with anxiety, and opiate dependence who presents to the emergency department with speech difficulty. Husband is at bedside and assists with the history. Patient had reportedly been doing well since hospital discharge on 06/09/2016 following treatment for pneumonia which required endotracheal intubation, but was then noted tonight to develop acute difficulty while playing a game with her grandchild at home. She was reportedly unable to count the play money used in the game, and then very slow to respond to questioning and seemingly confused. There had been no recent fall or head injury and no alcohol or illicit drug use. Patient was reportedly well at approximately 9 PM, and with her speech difficulty persisting, her husband eventually brought her into the emergency department for evaluation. Patient had not been complaining of any headache or change in vision or hearing, or any focal numbness or weakness. She had seemed to have an uneventful day leading up to this. There was no loss of consciousness or seizure-like activity. And the patient had never exhibited a similar episode previously.  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  Her symptoms resolved and her mental status returned to normal shortly after admission. Echocardiogram had an EF of 60-65%. Normal wall motion, grade 2 diastolic dysfunction. Carotid ultrasound with right ICA 1-39% and left 60-79 % stenosis, follow up with neurology/vascular surgeon as an outpatient.  She also underwent an EEG which was negative for epileptiform discharges.  Neurology was consulted and have follow patient while hospitalized.  A repeat MRI with contrast showed asymmetric cortical swelling in the posterior left cerebral hemisphere which could be seizure epiphenomenon or infectious.  Patient was empirically started on acyclovir due to concern for HSV, and underwent an LP on 4/17.  Once the herpes simplex DNA returned negative, her antibiotics were discontinued, discussed with neurology and she was cleared to go home.  Several other studies from the LP are still pending at the time of discharge, this can be followed up as an outpatient, include Lyme titers, VZV as well as anti-NMDA receptor antibodies. Chronic systolic CHF -patient was hospitalized at [facility] few weeks ago with what sounds like sepsis due to pulmonary origin, requiring intubation and ICU stay.  A 2D echo at that time showed depressed ejection fraction to 30-35%, and she was started on beta-blockers and ACE inhibitors.  Repeat 2D echo during his hospital stay showed recovery of her EF  to normal values as below.  She appears euvolemic.  This will need to be followed up as an outpatient. Mild fever unknown etiology -Fever resolved, no UTI, chest x-ray without any  infiltrates Hypertension  -Continue current medications.  History of opiate dependent  -continue buprenorphine Anxiety depression -Continue home medications. Diabetes mellitus -A1c less than 6, ? prediabetes, diet-controlled   Patient has low TSH with normal free T3 and free T4. Recommended outpatient follow-up with PCP and repeating thyroid function tests.   Discharge Instructions  Discharge Instructions    Ambulatory referral to Neurology    Complete by:  As directed    An appointment is requested in approximately: 1 week- 2 weeks     Allergies as of 06/25/2016      Reactions   Prednisone Hypertension      Medication List    TAKE these medications   albuterol (2.5 MG/3ML) 0.083% nebulizer solution Commonly known as:  PROVENTIL Take 2.5 mg by nebulization every 6 (six) hours as needed for wheezing or shortness of breath.   albuterol 108 (90 Base) MCG/ACT inhaler Commonly known as:  PROAIR HFA inhale 2 puffs INTO THE LUNGS every 4 hours if needed for wheezing or shortness of breath   AMBULATORY NON FORMULARY MEDICATION Take 1 tablet by mouth daily. Patient reports taking wheat grass to help control her glucose.   atorvastatin 40 MG tablet Commonly known as:  LIPITOR Take 1 tablet (40 mg total) by mouth daily.   budesonide-formoterol 160-4.5 MCG/ACT inhaler Commonly known as:  SYMBICORT Inhale 2 puffs into the lungs 2 (two) times daily.   furosemide 20 MG tablet Commonly known as:  LASIX Take 1 tablet (20 mg total) by mouth daily as needed for fluid or edema.   gabapentin 300 MG capsule Commonly known as:  NEURONTIN Take 1 capsule (300 mg total) by mouth 3 (three) times daily.   lisinopril 10 MG tablet Commonly known as:  PRINIVIL,ZESTRIL Take 1 tablet (10 mg total) by mouth daily. What changed:  how much to take   metoprolol succinate 25 MG 24 hr tablet Commonly known as:  TOPROL-XL Take 25 mg by mouth daily.   omega-3 acid ethyl esters 1 g  capsule Commonly known as:  LOVAZA Take 2 capsules (2 g total) by mouth 2 (two) times daily.   SUBOXONE 8-2 MG Film Generic drug:  Buprenorphine HCl-Naloxone HCl Take 0.5 Film by mouth 2 (two) times daily.   Vilazodone HCl 40 MG Tabs Commonly known as:  VIIBRYD Take 1 tablet (40 mg total) by mouth daily.      Follow-up Information    [place], PA-C. Schedule an appointment as soon as possible for a visit in 2 week(s).   Specialty:  Family Medicine Contact information: [address] [place] [address] [phone]          Allergies  Allergen Reactions  . Prednisone Hypertension    Consultations:  Neurology   Procedures/Studies:  LP  2D echo Study Conclusions - Left ventricle: The cavity size was normal. Wall thickness was normal. Systolic function was normal. The estimated ejection fraction was in the range of 60% to 65%. Wall motion was normal; there were no regional wall motion abnormalities. Features are consistent with a pseudonormal left ventricular filling pattern, with concomitant abnormal relaxation and increased filling pressure (grade 2 diastolic dysfunction).  Ct Angio Head W Or Wo Contrast  Result Date: 06/21/2016 CLINICAL DATA:  Confusion with altered mental status. EXAM: CT  ANGIOGRAPHY HEAD AND NECK TECHNIQUE: Multidetector CT imaging of the head and neck was performed using the standard protocol during bolus administration of intravenous contrast. Multiplanar CT image reconstructions and MIPs were obtained to evaluate the vascular anatomy. Carotid stenosis measurements (when applicable) are obtained utilizing NASCET criteria, using the distal internal carotid diameter as the denominator. CONTRAST:  50 mL Isovue 370 COMPARISON:  Concomitant head CT. FINDINGS: CTA NECK FINDINGS Aortic arch: There is no aneurysm or dissection of the visualized ascending aorta or aortic arch. There is a normal 3 vessel branching pattern. The  visualized proximal subclavian arteries are normal. Right carotid system: The right common carotid origin is widely patent. There is no common carotid or internal carotid artery dissection or aneurysm. No hemodynamically significant stenosis. Left carotid system: The left common carotid origin is widely patent. There is no common carotid or internal carotid artery dissection or aneurysm. No hemodynamically significant stenosis. Vertebral arteries: The vertebral system is left dominant. Both vertebral artery origins are normal. Both vertebral arteries are normal to their confluence with the basilar artery. Skeleton: There is no bony spinal canal stenosis. No lytic or blastic lesions. Other neck: The nasopharynx is clear. The oropharynx and hypopharynx are normal. The epiglottis is normal. The supraglottic larynx, glottis and subglottic larynx are normal. No retropharyngeal collection. The parapharyngeal spaces are preserved. The parotid and submandibular glands are normal. No sialolithiasis or salivary ductal dilatation. The thyroid gland is normal. There is no cervical lymphadenopathy. Upper chest: No pneumothorax or pleural effusion. No nodules or masses. Review of the MIP images confirms the above findings CTA HEAD FINDINGS Anterior circulation: --Intracranial internal carotid arteries: Normal. --Anterior cerebral arteries: There is an absent right A1 segment, a normal congenital variant. The left ACA is normal. --Middle cerebral arteries: Normal. --Posterior communicating arteries: Absent bilaterally. Posterior circulation: --Posterior cerebral arteries: Normal. --Superior cerebellar arteries: Normal. --Basilar artery: Normal. --Anterior inferior cerebellar arteries: Normal. --Posterior inferior cerebellar arteries: Normal. Venous sinuses: As permitted by contrast timing, patent. Anatomic variants: Absent right anterior cerebral artery A1 segment Delayed phase:  Not performed. Review of the MIP images confirms  the above findings IMPRESSION: Normal CTA of the head and neck. Electronically Signed   By: Deatra Robinson M.D.   On: 06/21/2016 02:44   Dg Chest 2 View  Result Date: 06/21/2016 CLINICAL DATA:  Acute onset of altered mental status. Aphasia. Initial encounter. EXAM: CHEST  2 VIEW COMPARISON:  None. FINDINGS: The lungs are well-aerated and clear. There is no evidence of focal opacification, pleural effusion or pneumothorax. The heart is normal in size; the mediastinal contour is within normal limits. No acute osseous abnormalities are seen. IMPRESSION: No acute cardiopulmonary process seen. Electronically Signed   By: Roanna Raider M.D.   On: 06/21/2016 04:27   Ct Angio Neck W Or Wo Contrast  Result Date: 06/21/2016 CLINICAL DATA:  Confusion with altered mental status. EXAM: CT ANGIOGRAPHY HEAD AND NECK TECHNIQUE: Multidetector CT imaging of the head and neck was performed using the standard protocol during bolus administration of intravenous contrast. Multiplanar CT image reconstructions and MIPs were obtained to evaluate the vascular anatomy. Carotid stenosis measurements (when applicable) are obtained utilizing NASCET criteria, using the distal internal carotid diameter as the denominator. CONTRAST:  50 mL Isovue 370 COMPARISON:  Concomitant head CT. FINDINGS: CTA NECK FINDINGS Aortic arch: There is no aneurysm or dissection of the visualized ascending aorta or aortic arch. There is a normal 3 vessel branching pattern. The visualized proximal subclavian arteries are  normal. Right carotid system: The right common carotid origin is widely patent. There is no common carotid or internal carotid artery dissection or aneurysm. No hemodynamically significant stenosis. Left carotid system: The left common carotid origin is widely patent. There is no common carotid or internal carotid artery dissection or aneurysm. No hemodynamically significant stenosis. Vertebral arteries: The vertebral system is left dominant.  Both vertebral artery origins are normal. Both vertebral arteries are normal to their confluence with the basilar artery. Skeleton: There is no bony spinal canal stenosis. No lytic or blastic lesions. Other neck: The nasopharynx is clear. The oropharynx and hypopharynx are normal. The epiglottis is normal. The supraglottic larynx, glottis and subglottic larynx are normal. No retropharyngeal collection. The parapharyngeal spaces are preserved. The parotid and submandibular glands are normal. No sialolithiasis or salivary ductal dilatation. The thyroid gland is normal. There is no cervical lymphadenopathy. Upper chest: No pneumothorax or pleural effusion. No nodules or masses. Review of the MIP images confirms the above findings CTA HEAD FINDINGS Anterior circulation: --Intracranial internal carotid arteries: Normal. --Anterior cerebral arteries: There is an absent right A1 segment, a normal congenital variant. The left ACA is normal. --Middle cerebral arteries: Normal. --Posterior communicating arteries: Absent bilaterally. Posterior circulation: --Posterior cerebral arteries: Normal. --Superior cerebellar arteries: Normal. --Basilar artery: Normal. --Anterior inferior cerebellar arteries: Normal. --Posterior inferior cerebellar arteries: Normal. Venous sinuses: As permitted by contrast timing, patent. Anatomic variants: Absent right anterior cerebral artery A1 segment Delayed phase:  Not performed. Review of the MIP images confirms the above findings IMPRESSION: Normal CTA of the head and neck. Electronically Signed   By: Deatra Robinson M.D.   On: 06/21/2016 02:44   Mr Brain Wo Contrast  Result Date: 06/21/2016 CLINICAL DATA:  Confusion and aphasia EXAM: MRI HEAD WITHOUT CONTRAST TECHNIQUE: Multiplanar, multiecho pulse sequences of the brain and surrounding structures were obtained without intravenous contrast. COMPARISON:  Head CT 06/21/2016 FINDINGS: Brain: No focal diffusion restriction to indicate acute  infarct. No intraparenchymal hemorrhage. There is mild multifocal hyperintense T2-weighted signal within the periventricular white matter, most often seen in the setting of chronic microvascular ischemia. No mass lesion or midline shift. No hydrocephalus or extra-axial fluid collection. The midline structures are normal. No age advanced or lobar predominant atrophy. Vascular: Major intracranial arterial and venous sinus flow voids are preserved. No evidence of chronic microhemorrhage or amyloid angiopathy. Skull and upper cervical spine: The visualized skull base, calvarium, upper cervical spine and extracranial soft tissues are normal. Sinuses/Orbits: No fluid levels or advanced mucosal thickening. No mastoid effusion. Normal orbits. IMPRESSION: 1. No acute intracranial abnormality. 2. Findings of mild chronic microvascular ischemia. Electronically Signed   By: Deatra Robinson M.D.   On: 06/21/2016 05:56   Mr Laqueta Jean ZO Contrast  Result Date: 06/22/2016 CLINICAL DATA:  Meningitis due to herpes simplex. New onset changes. EXAM: MRI HEAD WITHOUT AND WITH CONTRAST TECHNIQUE: Multiplanar, multiecho pulse sequences of the brain and surrounding structures were obtained without and with intravenous contrast. CONTRAST:  20mL MULTIHANCE GADOBENATE DIMEGLUMINE 529 MG/ML IV SOLN COMPARISON:  Yesterday FINDINGS: Brain: The cortex of the posterior left cerebral hemisphere is asymmetrically FLAIR hyperintense and swollen appearing. Bilateral serpiginous FLAIR hyperintensities in posterior cerebral sulci which could be subarachnoid or vascular. Borderline asymmetric sulcal enhancement along the posterior left occipital sulci. No evidence of ventriculitis or ventricular debris. No typical pattern HSV 1 encephalitis. No acute infarct, hemorrhage, or hydrocephalus. Negative for mass. Few FLAIR hyperintensities in the cerebral white matter, likely chronic and nonspecific.  These are often from nonspecific microvascular insults.  Vascular: Normal flow voids are preserved. No dural venous sinus thrombus noted. Skull and upper cervical spine: Negative Sinuses/Orbits: Right maxillary fluid level. IMPRESSION: 1. Asymmetric cortical swelling in the posterior left cerebral hemisphere which could be seizure epiphenomenon or infectious. Lumbar puncture is planned. 2. Right maxillary fluid level. Electronically Signed   By: Marnee Spring M.D.   On: 06/22/2016 13:54   Ct Head Code Stroke W/o Cm  Result Date: 06/21/2016 CLINICAL DATA:  Code stroke.  Confusion EXAM: CT HEAD WITHOUT CONTRAST TECHNIQUE: Contiguous axial images were obtained from the base of the skull through the vertex without intravenous contrast. COMPARISON:  None. FINDINGS: Brain: No mass lesion, intraparenchymal hemorrhage or extra-axial collection. No evidence of acute cortical infarct. There is periventricular hypoattenuation compatible with chronic microvascular disease. Mildly advanced atrophy for age. Vascular: No hyperdense vessel or unexpected calcification. Skull: Normal visualized skull base, calvarium and extracranial soft tissues. Sinuses/Orbits: Right maxillary sinus fluid level. Normal orbits. ASPECTS [facility] Stroke Program Early CT Score) - Ganglionic level infarction (caudate, lentiform nuclei, internal capsule, insula, M1-M3 cortex): 7 - Supraganglionic infarction (M4-M6 cortex): 3 Total score (0-10 with 10 being normal): 10 IMPRESSION: 1. Mild age advanced atrophy without acute intracranial abnormality. 2. Fluid level in the right maxillary sinus. Correlate clinically for signs of acute sinusitis. 3. ASPECTS is 10. These results were called by telephone at the time of interpretation on 06/21/2016 at 2:01 am to Dr. Ritta Slot, who verbally acknowledged these results. Electronically Signed   By: Deatra Robinson M.D.   On: 06/21/2016 02:02   Dg Fluoro Guide Lumbar Puncture  Result Date: 06/23/2016 CLINICAL DATA:  Encephalitis. EXAM: DIAGNOSTIC LUMBAR  PUNCTURE UNDER FLUOROSCOPIC GUIDANCE FLUOROSCOPY TIME:  Fluoroscopy Time: 2 minutes 0 seconds ; low dose pulsed fluoroscopy Radiation Exposure Index (if provided by the fluoroscopic device): 41.2 mGy Number of Acquired Spot Images: 0 PROCEDURE: Informed consent was obtained from the patient prior to the procedure, including potential complications of spinal headache, bleeding and infection. With the patient prone, the lower back was prepped with Betadine. 1% Lidocaine was used for local anesthesia. Lumbar puncture was performed at the L1-2 level using a 20 gauge needle with return of clear CSF with an opening pressure of 23 cm water. 14 ml of CSF were obtained for laboratory studies. The patient tolerated the procedure well and there were no apparent complications. IMPRESSION: Successful diagnostic lumbar puncture under fluoroscopic guidance. No immediate complication. Electronically Signed   By: Myles Rosenthal M.D.   On: 06/23/2016 16:12      Subjective: - no chest pain, shortness of breath, no abdominal pain, nausea or vomiting.   Discharge Exam: Vitals:   06/25/16 0131 06/25/16 0544  BP: 127/65 (!) 127/59  Pulse: 66 68  Resp: 20 20  Temp: 98.7 F (37.1 C) 98.9 F (37.2 C)   Vitals:   06/24/16 2143 06/25/16 0131 06/25/16 0544 06/25/16 0853  BP: (!) 117/56 127/65 (!) 127/59   Pulse: 71 66 68   Resp: Temp: 98.9 F (37.2 C) 98.7 F (37.1 C) 98.9 F (37.2 C)   TempSrc: Oral Oral Oral   SpO2: 96% 95% 98% 98%  Weight:      Height:        General: Pt is alert, awake, not in acute distress Cardiovascular: RRR, S1/S2 +, no rubs, no gallops Respiratory: CTA bilaterally, no wheezing, no rhonchi Abdominal: Soft, NT, ND, bowel sounds + Extremities: no  edema, no cyanosis    The results of significant diagnostics from this hospitalization (including imaging, microbiology, ancillary and laboratory) are listed below for reference.     Microbiology: Recent Results (from the past  240 hour(s))  CSF culture     Status: None (Preliminary result)   Collection Time: 06/23/16  3:45 PM  Result Value Ref Range Status   Specimen Description CSF  Final   Special Requests NONE  Final   Gram Stain   Final    WBC PRESENT, PREDOMINANTLY MONONUCLEAR NO ORGANISMS SEEN CYTOSPIN SMEAR    Culture NO GROWTH 2 DAYS  Final   Report Status PENDING  Incomplete     Labs: BNP (last 3 results) No results for input(s): BNP in the last 8760 hours. Basic Metabolic Panel:  Recent Labs Lab 06/21/16 0134 06/21/16 0150 06/23/16 0557 06/24/16 0650 06/25/16 0552  NA 134* 134* 139 142 143  K 3.7 3.7 4.3 4.5 4.5  CL 99* 98* 105 108 108  CO2 26  --  25 28 27   GLUCOSE 150* 146* 106* 106* 102*  BUN 17 18 6  <5* <5*  CREATININE 0.94 0.90 0.60 0.62 0.63  CALCIUM 9.2  --  8.9 9.0 8.9   Liver Function Tests:  Recent Labs Lab 06/21/16 0134  AST 21  ALT 33  ALKPHOS 50  BILITOT 0.7  PROT 6.5  ALBUMIN 3.9   No results for input(s): LIPASE, AMYLASE in the last 168 hours.  Recent Labs Lab 06/22/16 0755  AMMONIA 24   CBC:  Recent Labs Lab 06/21/16 0134 06/21/16 0150 06/23/16 0557  WBC 7.2  --  5.1  NEUTROABS 5.3  --   --   HGB 12.6 12.2 11.2*  HCT 36.3 36.0 33.4*  MCV 92.8  --  93.6  PLT 270  --  228   Cardiac Enzymes: No results for input(s): CKTOTAL, CKMB, CKMBINDEX, TROPONINI in the last 168 hours. BNP: Invalid input(s): POCBNP CBG:  Recent Labs Lab 06/24/16 1807 06/24/16 2028 06/25/16 0004 06/25/16 0401 06/25/16 0751  GLUCAP 102* 131* 109* 95 98   D-Dimer No results for input(s): DDIMER in the last 72 hours. Hgb A1c No results for input(s): HGBA1C in the last 72 hours. Lipid Profile No results for input(s): CHOL, HDL, LDLCALC, TRIG, CHOLHDL, LDLDIRECT in the last 72 hours. Thyroid function studies No results for input(s): TSH, T4TOTAL, T3FREE, THYROIDAB in the last 72 hours.  Invalid input(s): FREET3 Anemia work up No results for input(s):  VITAMINB12, FOLATE, FERRITIN, TIBC, IRON, RETICCTPCT in the last 72 hours. Urinalysis    Component Value Date/Time   COLORURINE YELLOW 06/21/2016 1110   APPEARANCEUR HAZY (A) 06/21/2016 1110   LABSPEC 1.045 (H) 06/21/2016 1110   PHURINE 5.0 06/21/2016 1110   GLUCOSEU 50 (A) 06/21/2016 1110   HGBUR NEGATIVE 06/21/2016 1110   HGBUR 1+ 10/08/2010 0815   BILIRUBINUR NEGATIVE 06/21/2016 1110   BILIRUBINUR neg 02/07/2016 0827   KETONESUR NEGATIVE 06/21/2016 1110   PROTEINUR NEGATIVE 06/21/2016 1110   UROBILINOGEN 0.2 02/07/2016 0827   UROBILINOGEN 0.2 10/08/2010 0815   NITRITE NEGATIVE 06/21/2016 1110   LEUKOCYTESUR NEGATIVE 06/21/2016 1110   Sepsis Labs Invalid input(s): PROCALCITONIN,  WBC,  LACTICIDVEN Microbiology Recent Results (from the past 240 hour(s))  CSF culture     Status: None (Preliminary result)   Collection Time: 06/23/16  3:45 PM  Result Value Ref Range Status   Specimen Description CSF  Final   Special Requests NONE  Final   Gram Stain  Final    WBC PRESENT, PREDOMINANTLY MONONUCLEAR NO ORGANISMS SEEN CYTOSPIN SMEAR    Culture NO GROWTH 2 DAYS  Final   Report Status PENDING  Incomplete     Time coordinating discharge: 40 minutes  SIGNED:  Pamella Pert, MD  [facility] 06/25/2016, 12:22 PM Pager [phone]  If 7PM-7AM, please contact night-coverage [email or web address] Password [email or web address]

## 2016-06-25 NOTE — Care Management Note (Signed)
Case Management Note  Patient Details  Name: Bridget Johnston MRN: [id number] Date of Birth: 06-16-1960  Subjective/Objective:                    Action/Plan: Pt discharging home with [facility] services. Pt was active with [facility] prior to admission and would like to continue with their services. [name] with [facility] notified of the discharge and the resumption of care. Pt has transportation home.   Expected Discharge Date:  06/25/16               Expected Discharge Plan:  IP Rehab Facility  In-House Referral:     Discharge planning Services  CM Consult  Post Acute Care Choice:  Home Health Choice offered to:  Patient  DME Arranged:    DME Agency:     HH Arranged:  RN, PT, OT HH Agency:  [facility]  Status of Service:  Completed, signed off  If discussed at Long Length of Stay Meetings, dates discussed:    Additional Comments:  Kermit Balo, RN 06/25/2016, 9:20 AM

## 2016-06-26 DIAGNOSIS — J45901 Unspecified asthma with (acute) exacerbation: Secondary | ICD-10-CM | POA: Diagnosis not present

## 2016-06-26 DIAGNOSIS — J9601 Acute respiratory failure with hypoxia: Secondary | ICD-10-CM | POA: Diagnosis not present

## 2016-06-26 DIAGNOSIS — J1520 Pneumonia due to staphylococcus, unspecified: Secondary | ICD-10-CM | POA: Diagnosis not present

## 2016-06-26 DIAGNOSIS — J9602 Acute respiratory failure with hypercapnia: Secondary | ICD-10-CM | POA: Diagnosis not present

## 2016-06-26 DIAGNOSIS — F1120 Opioid dependence, uncomplicated: Secondary | ICD-10-CM | POA: Diagnosis not present

## 2016-06-26 LAB — MISC LABCORP TEST (SEND OUT): Labcorp test code: 96487

## 2016-06-26 LAB — VARICELLA-ZOSTER BY PCR: Varicella-Zoster, PCR: NEGATIVE

## 2016-06-27 LAB — CSF CULTURE W GRAM STAIN

## 2016-06-27 LAB — CSF CULTURE: CULTURE: NO GROWTH

## 2016-07-01 ENCOUNTER — Encounter: Payer: Self-pay | Admitting: Emergency Medicine

## 2016-07-01 ENCOUNTER — Ambulatory Visit (INDEPENDENT_AMBULATORY_CARE_PROVIDER_SITE_OTHER): Payer: 59 | Admitting: Emergency Medicine

## 2016-07-01 VITALS — BP 124/72 | HR 116

## 2016-07-01 DIAGNOSIS — R0602 Shortness of breath: Secondary | ICD-10-CM

## 2016-07-01 DIAGNOSIS — J4530 Mild persistent asthma, uncomplicated: Secondary | ICD-10-CM | POA: Diagnosis not present

## 2016-07-01 NOTE — Progress Notes (Signed)
Subjective:    Patient ID: Bridget Johnston, female    DOB: 1960/12/05, 56 y.o.   MRN: [id number]  HPI 56 year old woman former smoker (17 pk-yrs) with a history of hypertension, systolic CHF, DM, depression, opiate dependence. 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 Ultimately her HSV DNA studies were negative on her lumbar puncture and antiviral medications were discontinued. The etiology of the neuro changes was never fully identified.   She carries a history of asthma, diagnosis made initially about 15 yrs ago when she was given SABA to resolve sx when she had URI, then started on symbicort 57 yrs ago. Has taken Symbicort reliably since recent critical illness, PNA from metapneumovirus in March '18 [facility]).  SABA use has gone down while on symbicort. She does have seasonal allergies. She does have some GERD, on ranitidine, seems well controlled.   CXR 06/21/16 >> no infiltrates.    Review of Systems  Constitutional: Negative for fever and unexpected weight change.  HENT: Negative for congestion, dental problem, ear pain, nosebleeds, postnasal drip, rhinorrhea, sinus pressure, sneezing, sore throat and trouble swallowing.   Eyes: Negative for redness and itching.  Respiratory: Positive for shortness of breath. Negative for cough, chest tightness and wheezing.   Cardiovascular: Negative for palpitations and leg swelling.  Gastrointestinal: Negative for nausea and vomiting.  Genitourinary: Negative for dysuria.  Musculoskeletal: Negative for joint swelling.  Skin: Negative for rash.  Neurological: Positive for headaches.  Hematological: Does not bruise/bleed easily.  Psychiatric/Behavioral: Negative for dysphoric mood. The patient is not nervous/anxious.     Past Medical History:  Diagnosis Date  . Arthritis   . Asthma   . Back pain   . Diabetes mellitus without complication (HCC)   . Finger pain   . Hand pain   . High  triglycerides   . Leg pain      Family History  Problem Relation Age of Onset  . Heart disease Mother   . Hypertension Mother   . Heart disease Father   . Diabetes Father   . Hypertension Father   . Arthritis    . Diabetes    . Cancer - Colon Maternal Grandfather      Social History   Social History  . Marital status: Married    Spouse name: N/A  . Number of children: N/A  . Years of education: college   Occupational History  . hairdresser Other   Social History Main Topics  . Smoking status: Current Every Day Smoker    Packs/day: 1.00    Years: 17.00    Types: Cigarettes, E-cigarettes    Last attempt to quit: 01/06/2012  . Smokeless tobacco: Never Used  . Alcohol use No  . Drug use: No     Comment: opiod adduction  . Sexual activity: Yes    Partners: Male    Birth control/ protection: Surgical     Comment: BTL   Other Topics Concern  . Not on file   Social History Narrative  . No narrative on file     Allergies  Allergen Reactions  . Prednisone Hypertension     Outpatient Medications Prior to Visit  Medication Sig Dispense Refill  . albuterol (PROAIR HFA) 108 (90 Base) MCG/ACT inhaler inhale 2 puffs INTO THE LUNGS every 4 hours if needed for wheezing or shortness of breath 8.5 g 2  . albuterol (PROVENTIL) (2.5 MG/3ML) 0.083% nebulizer solution Take 2.5 mg by  nebulization every 6 (six) hours as needed for wheezing or shortness of breath.    . AMBULATORY NON FORMULARY MEDICATION Take 1 tablet by mouth daily. Patient reports taking wheat grass to help control her glucose.     [name] Kitchen atorvastatin (LIPITOR) 40 MG tablet Take 1 tablet (40 mg total) by mouth daily. 30 tablet 5  . budesonide-formoterol (SYMBICORT) 160-4.5 MCG/ACT inhaler Inhale 2 puffs into the lungs 2 (two) times daily. 1 Inhaler 11  . furosemide (LASIX) 20 MG tablet Take 1 tablet (20 mg total) by mouth daily as needed for fluid or edema. 30 tablet 5  . gabapentin (NEURONTIN) 300 MG capsule Take 1  capsule (300 mg total) by mouth 3 (three) times daily. 90 capsule 5  . omega-3 acid ethyl esters (LOVAZA) 1 g capsule Take 2 capsules (2 g total) by mouth 2 (two) times daily. 120 capsule 5  . SUBOXONE 8-2 MG FILM Take 0.5 Film by mouth 2 (two) times daily.     . Vilazodone HCl (VIIBRYD) 40 MG TABS Take 1 tablet (40 mg total) by mouth daily. 30 tablet 5  . lisinopril (PRINIVIL,ZESTRIL) 10 MG tablet Take 1 tablet (10 mg total) by mouth daily. (Patient taking differently: Take 5 mg by mouth daily. ) 30 tablet 3  . metoprolol succinate (TOPROL-XL) 25 MG 24 hr tablet Take 25 mg by mouth daily.      No facility-administered medications prior to visit.         Objective:   Physical Exam Vitals:   07/01/16 0949  BP: 124/72  Pulse: (!) 116  SpO2: 93%   Gen: Pleasant, well-nourished, in no distress,  normal affect  ENT: No lesions,  mouth clear,  oropharynx clear, no postnasal drip  Neck: No JVD, no stridor  Lungs: No use of accessory muscles, clear without rales or rhonchi  Cardiovascular: RRR, heart sounds normal, no murmur or gallops, no peripheral edema  Musculoskeletal: No deformities, no cyanosis or clubbing  Neuro: alert, non focal  Skin: Warm, no lesions or rashes      Assessment & Plan:  Asthma Based on her symptoms, triggers, response to bronchodilators I suspect that she does have true asthma. Potential exacerbators include GERD (appears to be controlled on ranitidine), seasonal allergic rhinitis. I believe she needs full PFT in order to quantify her degree of obstruction. She is no longer on an ACE inhibitor. We will continue her Symbicort as ordered for now. Consider stepdown in her therapy depending on her obstruction. Continue to treat her GERD and rhinitis. Albuterol when necessary.  Please stay on Symbicort 2 puffs twice a day Keep albuterol available to use 2 puffs up to every 4 hours if needed for shortness of breath.  We will perform full pulmonary function  testing same day as your next office visit.  Continue your ranitidine as you are taking it.  Consider taking loratadine  daily through the allergy season.  Follow with Dr Delton Coombes next available with full PFT  Levy Pupa, MD, PhD 07/01/2016, 11:10 AM [facility] [phone] or if no answer [phone]

## 2016-07-01 NOTE — Patient Instructions (Addendum)
Please stay on Symbicort 2 puffs twice a day Keep albuterol available to use 2 puffs up to every 4 hours if needed for shortness of breath.  We will perform full pulmonary function testing same day as your next office visit.  Continue your ranitidine as you are taking it.  Consider taking loratadine  daily through the allergy season.  Follow with Dr Delton Coombes next available with full PFT

## 2016-07-01 NOTE — Assessment & Plan Note (Signed)
Based on her symptoms, triggers, response to bronchodilators I suspect that she does have true asthma. Potential exacerbators include GERD (appears to be controlled on ranitidine), seasonal allergic rhinitis. I believe she needs full PFT in order to quantify her degree of obstruction. She is no longer on an ACE inhibitor. We will continue her Symbicort as ordered for now. Consider stepdown in her therapy depending on her obstruction. Continue to treat her GERD and rhinitis. Albuterol when necessary.  Please stay on Symbicort 2 puffs twice a day Keep albuterol available to use 2 puffs up to every 4 hours if needed for shortness of breath.  We will perform full pulmonary function testing same day as your next office visit.  Continue your ranitidine as you are taking it.  Consider taking loratadine  daily through the allergy season.  Follow with Dr Delton Coombes next available with full PFT

## 2016-07-02 ENCOUNTER — Other Ambulatory Visit: Payer: Self-pay | Admitting: Physician Assistant

## 2016-07-02 DIAGNOSIS — J4520 Mild intermittent asthma, uncomplicated: Secondary | ICD-10-CM

## 2016-07-03 ENCOUNTER — Encounter: Payer: Self-pay | Admitting: Physician Assistant

## 2016-07-03 ENCOUNTER — Ambulatory Visit (INDEPENDENT_AMBULATORY_CARE_PROVIDER_SITE_OTHER): Payer: 59 | Admitting: Physician Assistant

## 2016-07-03 ENCOUNTER — Ambulatory Visit (INDEPENDENT_AMBULATORY_CARE_PROVIDER_SITE_OTHER): Payer: 59 | Admitting: Neurology

## 2016-07-03 ENCOUNTER — Encounter: Payer: Self-pay | Admitting: Neurology

## 2016-07-03 VITALS — BP 122/72 | HR 83 | Wt 224.0 lb

## 2016-07-03 VITALS — BP 128/72 | HR 102 | Temp 98.6°F | Ht 63.0 in | Wt 222.0 lb

## 2016-07-03 DIAGNOSIS — R9089 Other abnormal findings on diagnostic imaging of central nervous system: Secondary | ICD-10-CM | POA: Diagnosis not present

## 2016-07-03 DIAGNOSIS — R404 Transient alteration of awareness: Secondary | ICD-10-CM

## 2016-07-03 DIAGNOSIS — G0490 Encephalitis and encephalomyelitis, unspecified: Secondary | ICD-10-CM

## 2016-07-03 DIAGNOSIS — R4701 Aphasia: Secondary | ICD-10-CM
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
## 2016-07-03 NOTE — Progress Notes (Signed)
   Subjective:    Patient ID: Bridget Johnston, female    DOB: 1960-08-07, 56 y.o.   MRN: [id number]  HPI  Pt is a 56 yo female accompanied by her husband for hospital follow up. Pt was admitted to the hospital on 06/21/16 for alternated mental status. Complete work up was done to rule out stroke and she was eventually dx with encephalitis with unknown cause.   EEG results:  Clinical Correlation of the above findings indicates focal abnormality in the left hemisphere, as well as diffuse cerebral dysfunction that is non-specific in etiology and can be seen with hypoxic/ischemic injury, toxic/metabolic encephalopathies, neurodegenerative disorders, or medication effect.  The absence of epileptiform discharges does not rule out a clinical diagnosis of epilepsy.  Clinical correlation is advised.  MRI results: 1. Asymmetric cortical swelling in the posterior left cerebral hemisphere which could be seizure epiphenomenon or infectious. Lumbar puncture is planned. 2. Right maxillary fluid level.  Pt saw neurologist this am and ordered another EEG.   Pt saw pulmonologist on 4/25- medications have not changed.     Review of Systems  All other systems reviewed and are negative.      Objective:   Physical Exam  Constitutional: She is oriented to person, place, and time. She appears well-developed and well-nourished.  HENT:  Head: Normocephalic and atraumatic.  Eyes: Conjunctivae are normal. Right eye exhibits no discharge. Left eye exhibits no discharge.  Neck: Normal range of motion. Neck supple.  Cardiovascular: Normal rate, regular rhythm and normal heart sounds.   Pulmonary/Chest: Effort normal and breath sounds normal. She has no wheezes.  Lymphadenopathy:    She has no cervical adenopathy.  Neurological: She is alert and oriented to person, place, and time. No cranial nerve deficit.  Psychiatric: She has a normal mood and affect. Her behavior is normal.          Assessment & Plan:   [name] KitchenMarland KitchenAujanae was seen today for hospital f/u.  Diagnoses and all orders for this visit:  Aphasia  Awareness alteration, transient  Encephalitis   Continue to follow up with neurologist for further testing. Upon further questioning after asking if patient had been taking medication regularly she mention there was a 4 to 5 days in the hospital for the acute respiratory failure that patient missed her suboxone and viibryd. Question arises if sudden withdrawal of these medications could cause neuro symptoms. Will send to neurologist for review.

## 2016-07-04 LAB — MISC LABCORP TEST (SEND OUT): Labcorp test code: 9985

## 2016-07-05 ENCOUNTER — Encounter: Payer: Self-pay | Admitting: Physician Assistant

## 2016-07-06 ENCOUNTER — Ambulatory Visit (INDEPENDENT_AMBULATORY_CARE_PROVIDER_SITE_OTHER): Payer: 59 | Admitting: Neurology

## 2016-07-06 DIAGNOSIS — R404 Transient alteration of awareness: Secondary | ICD-10-CM

## 2016-07-06 DIAGNOSIS — R4701 Aphasia: Secondary | ICD-10-CM | POA: Diagnosis not present

## 2016-07-06 DIAGNOSIS — R9089 Other abnormal findings on diagnostic imaging of central nervous system: Secondary | ICD-10-CM

## 2016-07-07 DIAGNOSIS — R9089 Other abnormal findings on diagnostic imaging of central nervous system: Secondary | ICD-10-CM | POA: Diagnosis not present

## 2016-07-07 DIAGNOSIS — R4701 Aphasia: Secondary | ICD-10-CM | POA: Diagnosis not present

## 2016-07-07 DIAGNOSIS — R404 Transient alteration of awareness: Secondary | ICD-10-CM | POA: Diagnosis not present

## 2016-07-07 LAB — MISC LABCORP TEST (SEND OUT): LABCORP TEST CODE: 9985

## 2016-07-09 ENCOUNTER — Telehealth: Payer: Self-pay | Admitting: Neurology

## 2016-07-09 ENCOUNTER — Encounter: Payer: Self-pay | Admitting: Neurology

## 2016-07-09 NOTE — Telephone Encounter (Signed)
PT called and said she had the wires taken off yesterday from her EEG and she said she had an episode in the office of her knees buckling  And it is still doing it and wanted to know if something was found on the EEG or if this normally happens after the testing

## 2016-07-10 MED ORDER — ZONISAMIDE 100 MG PO CAPS: ORAL_CAPSULE | ORAL | 4 refills | Status: DC

## 2016-07-10 NOTE — Telephone Encounter (Signed)
Yesterday when she stands up, feet goes numb, knees feel they would jump/jerk for a minute, stop, then comes back. She had 3 or 4 episodes where she felt she would fall. Discussed EEG showing left temporal slowing, no epileptiform discharges seen. We had discussed starting medication for both seizure and headache prophylaxis, start Zonisamide 100mg : Take 1 cap qhs x 2 weeks, then increase to 2 caps qhs x 2 weeks, then increase to 3 caps qhs. Side effects were discussed. Proceed with MRI as scheduled.

## 2016-07-11 ENCOUNTER — Encounter: Payer: Self-pay | Admitting: Neurology

## 2016-07-15 NOTE — Progress Notes (Deleted)
Referring-Breeback, Lonna Cobb, PA-C Reason for referral-CHF  HPI: 56 year old female for evaluation of congestive heart failure at the request of Jade Breeback L, PA-C. Patient apparently was admitted to [facility] March 2018 with pneumonia and respiratory failure. Echocardiogram was technically difficult but showed ejection fraction of 30-35%. Troponins were mildly elevated. Placed on ACEI and toprol. DCed. Readmitted to [facility] with AMS. No CVA found; ? encephalitis. Echocardiogram April 2018 showed normal LV systolic function and grade 2 diastolic dysfunction. Carotid Dopplers April 2018 showed 1-39% right and 60-79% left stenosis. CTA of the head and neck April 2018 normal. Cardiology now asked to evaluate.   Current Outpatient Prescriptions  Medication Sig Dispense Refill  . AMBULATORY NON FORMULARY MEDICATION Take 1 tablet by mouth daily. Patient reports taking wheat grass to help control her glucose.     [name] Kitchen atorvastatin (LIPITOR) 40 MG tablet Take 1 tablet (40 mg total) by mouth daily. 30 tablet 5  . budesonide-formoterol (SYMBICORT) 160-4.5 MCG/ACT inhaler Inhale 2 puffs into the lungs 2 (two) times daily. 1 Inhaler 11  . cholecalciferol (VITAMIN D) 1000 units tablet Take 2,000 Units by mouth daily.    . furosemide (LASIX) 20 MG tablet Take 1 tablet (20 mg total) by mouth daily as needed for fluid or edema. 30 tablet 5  . gabapentin (NEURONTIN) 300 MG capsule Take 1 capsule (300 mg total) by mouth 3 (three) times daily. 90 capsule 5  . omega-3 acid ethyl esters (LOVAZA) 1 g capsule Take 2 capsules (2 g total) by mouth 2 (two) times daily. 120 capsule 5  . PROAIR HFA 108 (90 Base) MCG/ACT inhaler inhale 2 puffs by mouth every 4 hours if needed for wheezing or shortness of breath 8.5 g 2  . SUBOXONE 8-2 MG FILM Take 0.5 Film by mouth 2 (two) times daily.     . Vilazodone HCl (VIIBRYD) 40 MG TABS Take 1 tablet (40 mg total) by mouth daily. 30 tablet 5  . zonisamide (ZONEGRAN) 100 MG capsule Take 1  cap qhs for 2 weeks, then increase to 2 caps qhs for 2 weeks, then increase to 3 caps qhs and continue 90 capsule 4   No current facility-administered medications for this visit.     Allergies  Allergen Reactions  . Prednisone Hypertension    Past Medical History:  Diagnosis Date  . Arthritis   . Asthma   . Back pain   . Diabetes mellitus without complication (HCC)   . Finger pain   . Hand pain   . High triglycerides   . Leg pain     Past Surgical History:  Procedure Laterality Date  . ABLATION  2007  . BTL  1988  . NOVASURE ABLATION    . TONSILLECTOMY    . TUBAL LIGATION      Social History   Social History  . Marital status: Married    Spouse name: N/A  . Number of children: N/A  . Years of education: college   Occupational History  . hairdresser Other   Social History Main Topics  . Smoking status: Former Smoker    Packs/day: 1.00    Years: 17.00    Types: Cigarettes, E-cigarettes    Quit date: 01/06/2012  . Smokeless tobacco: Never Used  . Alcohol use No  . Drug use: No     Comment: opiod adduction  . Sexual activity: Yes    Partners: Male    Birth control/ protection: Surgical     Comment: BTL  Other Topics Concern  . Not on file   Social History Narrative  . No narrative on file    Family History  Problem Relation Age of Onset  . Heart disease Mother   . Hypertension Mother   . Heart disease Father   . Diabetes Father   . Hypertension Father   . Arthritis    . Diabetes    . Cancer - Colon Maternal Grandfather     ROS: no fevers or chills, productive cough, hemoptysis, dysphasia, odynophagia, melena, hematochezia, dysuria, hematuria, rash, seizure activity, orthopnea, PND, pedal edema, claudication. Remaining systems are negative.  Physical Exam:   There were no vitals taken for this visit.  General:  Well developed/well nourished in NAD Skin warm/dry Patient not depressed No peripheral  clubbing Back-normal HEENT-normal/normal eyelids Neck supple/normal carotid upstroke bilaterally; no bruits; no JVD; no thyromegaly chest - CTA/ normal expansion CV - RRR/normal S1 and S2; no murmurs, rubs or gallops;  PMI nondisplaced Abdomen -NT/ND, no HSM, no mass, + bowel sounds, no bruit 2+ femoral pulses, no bruits Ext-no edema, chords, 2+ DP Neuro-grossly nonfocal  ECG - personally reviewed  A/P  1  Bridget MillersBrian Elyssa Pendelton, MD

## 2016-07-16 ENCOUNTER — Ambulatory Visit
Admission: RE | Admit: 2016-07-16 | Discharge: 2016-07-16 | Disposition: A | Discharge status: Home / Self Care | Payer: 59 | Source: Ambulatory Visit | Attending: Neurology | Admitting: Neurology

## 2016-07-16 DIAGNOSIS — R9089 Other abnormal findings on diagnostic imaging of central nervous system: Secondary | ICD-10-CM

## 2016-07-16 DIAGNOSIS — R4701 Aphasia: Secondary | ICD-10-CM

## 2016-07-16 DIAGNOSIS — R404 Transient alteration of awareness: Secondary | ICD-10-CM

## 2016-07-17 ENCOUNTER — Telehealth: Payer: Self-pay

## 2016-07-17 NOTE — Telephone Encounter (Signed)
Called pt and relayed message below.  Pt asked about driving.  Per Dr. Karel JarvisAquino, once pt is taking 3 capsule of Zonisamide 100MG  QD for 2 weeks she can resume driving activities.

## 2016-07-17 NOTE — Telephone Encounter (Signed)
-----   Message from Van ClinesKaren M Aquino, MD sent at 07/17/2016  8:41 AM EDT ----- Pls let her know I reviewed MRI brain and it is significantly improved from the last visit. No now changes, no tumor, stroke, or bleed. Thanks

## 2016-07-21 ENCOUNTER — Encounter: Payer: Self-pay | Admitting: Cardiology

## 2016-07-21 NOTE — Procedures (Signed)
ELECTROENCEPHALOGRAM REPORT  Dates of Recording: 07/06/2016 12:09PM to 07/08/2016 11:19AM  Patient's Name: Bridget Johnston MRN: [id number] Date of Birth: [birth year]  Referring Provider: Dr. Patrcia DollyKaren Aquino  Procedure: 48-hour ambulatory EEG  History: This is a 56 year old woman with an episode of aphasia and confusion.   Medications:  Neurontin Lipitor Symbicort Lasix ProAir HFA Lovaza Vitamin D Suboxone  Vibrid  Technical Summary: This is a 48-hour multichannel digital EEG recording measured by the international 10-20 system with electrodes applied with paste and impedances below 5000 ohms performed as portable with EKG monitoring.  The digital EEG was referentially recorded, reformatted, and digitally filtered in a variety of bipolar and referential montages for optimal display.    DESCRIPTION OF RECORDING: During maximal wakefulness, the background activity consisted of a symmetric 9 Hz posterior dominant rhythm which was reactive to eye opening. There was occasional focal 4-5 Hz theta slowing seen over the left hemisphere. There were no epileptiform discharges seen in wakefulness.  During the recording, the patient progresses through wakefulness, drowsiness, and Stage 2 sleep. Similar occasional focal slowing is seen over the left hemisphere, at times sharply contoured over the left temporal region without clear epileptogenic potential.  Again, there were no clear epileptiform discharges seen.  Events: There were no push button events.   There were no electrographic seizures seen.  EKG lead was unremarkable.  IMPRESSION: This 48-hour ambulatory EEG study is abnormal due to occasional focal slowing over the left hemisphere.  Clinical Correlation of the above findings indicates focal cerebral dysfunction over the left hemisphere suggestive of underlying structural or physiologic abnormality. The absence of epileptiform discharges does not exclude a clinical diagnosis of epilepsy.  Clinical correlation is advised.   Patrcia DollyKaren Aquino, M.D.

## 2016-07-22 ENCOUNTER — Ambulatory Visit: Payer: 59 | Admitting: Cardiology

## 2016-08-05 ENCOUNTER — Encounter: Payer: Self-pay | Admitting: Physician Assistant

## 2016-08-05 ENCOUNTER — Other Ambulatory Visit: Payer: Self-pay

## 2016-08-05 MED ORDER — FUROSEMIDE 20 MG PO TABS: 20.0000 mg | ORAL_TABLET | Freq: Every day | ORAL | 1 refills | Status: DC | PRN

## 2016-08-07 ENCOUNTER — Ambulatory Visit: Payer: Managed Care, Other (non HMO) | Admitting: Physician Assistant

## 2016-08-12 ENCOUNTER — Other Ambulatory Visit: Payer: Self-pay

## 2016-08-12 MED ORDER — BUDESONIDE-FORMOTEROL FUMARATE 160-4.5 MCG/ACT IN AERO: 2.0000 | INHALATION_SPRAY | Freq: Two times a day (BID) | RESPIRATORY_TRACT | 3 refills | Status: DC

## 2016-08-12 NOTE — Progress Notes (Signed)
Rx sent 

## 2016-08-12 NOTE — Progress Notes (Signed)
Ok for refills #3

## 2016-08-22 ENCOUNTER — Other Ambulatory Visit: Payer: Self-pay | Admitting: Physician Assistant

## 2016-08-22 DIAGNOSIS — J4520 Mild intermittent asthma, uncomplicated: Secondary | ICD-10-CM

## 2016-09-04 ENCOUNTER — Ambulatory Visit: Payer: 59 | Admitting: Emergency Medicine

## 2016-09-10 ENCOUNTER — Ambulatory Visit (INDEPENDENT_AMBULATORY_CARE_PROVIDER_SITE_OTHER): Payer: 59 | Admitting: Physician Assistant

## 2016-09-10 ENCOUNTER — Encounter: Payer: Self-pay | Admitting: Physician Assistant

## 2016-09-10 ENCOUNTER — Ambulatory Visit (INDEPENDENT_AMBULATORY_CARE_PROVIDER_SITE_OTHER): Payer: 59

## 2016-09-10 VITALS — BP 135/82 | HR 105 | Temp 98.5°F | Ht 63.0 in | Wt 226.0 lb

## 2016-09-10 DIAGNOSIS — R059 Cough, unspecified: Secondary | ICD-10-CM

## 2016-09-10 DIAGNOSIS — J0100 Acute maxillary sinusitis, unspecified: Secondary | ICD-10-CM | POA: Diagnosis not present

## 2016-09-10 DIAGNOSIS — R05 Cough: Secondary | ICD-10-CM | POA: Diagnosis not present

## 2016-09-10 DIAGNOSIS — Z8709 Personal history of other diseases of the respiratory system: Secondary | ICD-10-CM | POA: Insufficient documentation

## 2016-09-10 DIAGNOSIS — R609 Edema, unspecified: Secondary | ICD-10-CM

## 2016-09-10 DIAGNOSIS — Z8701 Personal history of pneumonia (recurrent): Secondary | ICD-10-CM | POA: Insufficient documentation

## 2016-09-10 MED ORDER — AZITHROMYCIN 250 MG PO TABS: ORAL_TABLET | ORAL | 0 refills | Status: DC

## 2016-09-10 MED ORDER — IPRATROPIUM BROMIDE 0.06 % NA SOLN: 1.0000 | Freq: Four times a day (QID) | NASAL | 0 refills | Status: DC | PRN

## 2016-09-10 NOTE — Patient Instructions (Addendum)
-   Antibiotic for 5 days - Nasal spray up to 4 times daily - Tylenol 650mg  every 8 hours as needed for headache - No oral decongestants - Purchase pulse oximeter. Return if resting oxygen is less than 92% - Return if weight is fluctuating by approximately 10 pounds

## 2016-09-10 NOTE — Progress Notes (Signed)
HPI:                                                                Bridget Johnston is a 56 y.o. 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 She does endorse family members at home have been sick with "the crud." She states that she had similar symptoms the last time she was hospitalized.   Sinus Problem  This is a new problem. The current episode started in the past 7 days. The problem is unchanged. There has been no fever. Associated symptoms include congestion, coughing, headaches, a hoarse voice, shortness of breath and sinus pressure. Pertinent negatives include no chills, ear pain or sore throat. (+ PND) Past treatments include oral decongestants. The treatment provided mild relief.  URI   Associated symptoms include congestion, coughing, headaches and sinus pain. Pertinent negatives include no ear pain, rash or sore throat.     Past Medical History:  Diagnosis Date  . Arthritis   . Asthma   . Back pain   . Diabetes mellitus without complication (HCC)   . Finger pain   . Hand pain   . High triglycerides   . Leg pain    Past Surgical History:  Procedure Laterality Date  . ABLATION  2007  . BTL  1988  . NOVASURE ABLATION    . TONSILLECTOMY    . TUBAL LIGATION     Social History  Substance Use Topics  . Smoking status: Former Smoker    Packs/day: 1.00    Years: 17.00    Types: Cigarettes, E-cigarettes    Quit date: 01/06/2012  . Smokeless tobacco: Never Used  . Alcohol use No   family history includes Cancer - Colon in her maternal grandfather; Diabetes in her father; Heart disease in her father and mother; Hypertension in her father and mother.  ROS: Review of Systems  Constitutional: Negative for chills and fever.   HENT: Positive for congestion, hoarse voice, sinus pain and sinus pressure. Negative for ear pain and sore throat.   Respiratory: Positive for cough and shortness of breath. Negative for hemoptysis.   Cardiovascular: Negative.   Gastrointestinal: Negative.   Genitourinary: Negative.   Musculoskeletal: Negative for myalgias.  Skin: Negative for rash.  Neurological: Positive for headaches.    Medications: Current Outpatient Prescriptions  Medication Sig Dispense Refill  . AMBULATORY NON FORMULARY MEDICATION Take 1 tablet by mouth daily. Patient reports taking wheat grass to help control her glucose.     [name] Kitchen atorvastatin (LIPITOR) 40 MG tablet Take 1 tablet (40 mg total) by mouth daily. 30 tablet 5  . budesonide-formoterol (SYMBICORT) 160-4.5 MCG/ACT inhaler Inhale 2 puffs into the lungs 2 (two) times daily. 1 Inhaler 3  . cholecalciferol (VITAMIN D) 1000 units tablet Take 2,000 Units by mouth daily.    . fexofenadine (ALLEGRA) 30 MG tablet Take 30 mg by mouth 2 (two) times daily.    . furosemide (LASIX) 20 MG tablet Take 1 tablet (20 mg total) by mouth daily as needed for fluid or edema. 90 tablet 1  .  gabapentin (NEURONTIN) 300 MG capsule Take 1 capsule (300 mg total) by mouth 3 (three) times daily. 90 capsule 5  . omega-3 acid ethyl esters (LOVAZA) 1 g capsule Take 2 capsules (2 g total) by mouth 2 (two) times daily. 120 capsule 5  . PROAIR HFA 108 (90 Base) MCG/ACT inhaler INHALE 2 PUFFS BY MOUTH EVERY 4 HOURS IF NEEDED FOR WHEEZING OR SHORTNESS OF BREATH 8.5 g 2  . SUBOXONE 8-2 MG FILM Take 0.5 Film by mouth 2 (two) times daily.     . Vilazodone HCl (VIIBRYD) 40 MG TABS Take 1 tablet (40 mg total) by mouth daily. 30 tablet 5  . zonisamide (ZONEGRAN) 100 MG capsule Take 1 cap qhs for 2 weeks, then increase to 2 caps qhs for 2 weeks, then increase to 3 caps qhs and continue 90 capsule 4  . azithromycin (ZITHROMAX Z-PAK) 250 MG tablet Take 2 tablets (500 mg) on  Day 1,  followed by 1 tablet  (250 mg) once daily on Days 2 through 5. 6 tablet 0  . ipratropium (ATROVENT) 0.06 % nasal spray Place 1 spray into both nostrils 4 (four) times daily as needed. 15 mL 0   No current facility-administered medications for this visit.    Allergies  Allergen Reactions  . Prednisone Hypertension       Objective:  BP 135/82   Pulse (!) 105   Temp 98.5 F (36.9 C) (Oral)   Ht 5\' 3"  (1.6 m)   Wt 226 lb (102.5 kg)   SpO2 97%   BMI 40.03 kg/m  Gen: well-groomed, cooperative, not ill-appearing, no distress HEENT: normal conjunctiva, wearing glasses, TM's clear, nasal mucosa edematous, oropharynx clear, moist mucus membranes, there is left maxillary sinus tenderness, neck supple, trachea midline Pulm: Normal work of breathing, normal phonation, clear to auscultation bilaterally, no wheezes, rales or rhonchi CV: Tachycardic, regular rhythm, s1 and s2 distinct, no murmurs, clicks or rubs  Neuro: alert and oriented x 3, EOM's intact, no tremor MSK: moving all extremities, normal gait and station, 1+ pitting peripheral edema Lymph: no cervical or tonsillar adenopathy Skin: warm, dry, intact; no rashes or lesions on exposed skin, no cyanosis   No results found for this or any previous visit (from the past 72 hour(s)). Dg Chest 2 View  Result Date: 09/10/2016 CLINICAL DATA:  Cough EXAM: CHEST  2 VIEW COMPARISON:  06/21/2016 chest radiograph. FINDINGS: Stable cardiomediastinal silhouette with normal heart size and aortic atherosclerosis. No pneumothorax. No pleural effusion. No pulmonary edema. Minimal curvilinear opacity at the lateral right lung base. No acute consolidative airspace disease. IMPRESSION: Minimal curvilinear opacity at the lateral right lung base, favor minimal scarring or atelectasis. Otherwise no active disease in the chest. Electronically Signed   By: Delbert PhenixJason A Poff M.D.   On: 09/10/2016 08:41      Assessment and Plan: 56 y.o. female with   1. Acute non-recurrent maxillary  sinusitis - azithromycin (ZITHROMAX Z-PAK) 250 MG tablet; Take 2 tablets (500 mg) on  Day 1,  followed by 1 tablet (250 mg) once daily on Days 2 through 5.  Dispense: 6 tablet; Refill: 0 - ipratropium (ATROVENT) 0.06 % nasal spray; Place 1 spray into both nostrils 4 (four) times daily as needed.  Dispense: 15 mL; Refill: 0 - discontinue oral decongestant due to tachycardia - Tylenol 650mg  every 8 hours as needed for headache  2. Cough - personally reviewed chest x-ray. No focal infiltrate. Patient will be covered for CAP with macrolide for her sinusitis -  DG Chest 2 View; Future - Purchase pulse oximeter. Return if resting oxygen is less than 92%  3. History of pneumonia   4. History of acute respiratory failure  5. Peripheral edema - personally reviewed previous echocardiograms, which showed history of acute heart failure with reduced EF. Most recent ECHO from 06/22/16 shows normal EF 60-65% - patients weights are stable - instructed to monitor weight and return if weight change >= 10 pounds  Patient education and anticipatory guidance given Patient agrees with treatment plan Follow-up as needed if symptoms worsen or fail to improve  Levonne Hubert PA-C  I spent 40 minutes with this patient, greater than 50% was face-to-face time counseling regarding the above diagnoses

## 2016-09-24 ENCOUNTER — Encounter: Payer: Self-pay | Admitting: Physician Assistant

## 2016-09-25 MED ORDER — GABAPENTIN 300 MG PO CAPS: 300.0000 mg | ORAL_CAPSULE | Freq: Three times a day (TID) | ORAL | 5 refills | Status: DC

## 2016-10-02 ENCOUNTER — Other Ambulatory Visit: Payer: Self-pay | Admitting: Physician Assistant

## 2016-10-02 DIAGNOSIS — J4520 Mild intermittent asthma, uncomplicated: Secondary | ICD-10-CM

## 2016-10-12 ENCOUNTER — Ambulatory Visit (INDEPENDENT_AMBULATORY_CARE_PROVIDER_SITE_OTHER): Payer: 59 | Admitting: Neurology

## 2016-10-12 ENCOUNTER — Encounter: Payer: Self-pay | Admitting: Neurology

## 2016-10-12 VITALS — BP 124/76 | HR 104 | Ht 63.0 in | Wt 222.0 lb

## 2016-10-12 DIAGNOSIS — R404 Transient alteration of awareness: Secondary | ICD-10-CM | POA: Insufficient documentation

## 2016-10-12 DIAGNOSIS — R4701 Aphasia: Secondary | ICD-10-CM | POA: Diagnosis not present

## 2016-10-12 MED ORDER — ZONISAMIDE 100 MG PO CAPS: ORAL_CAPSULE | ORAL | 11 refills | Status: DC
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
## 2016-10-12 NOTE — Patient Instructions (Signed)
1. Reduce Zonisamide 100mg : Take 2 caps at night 2. Continue neck exercises, let us know if you would like to proceed with physical therapy for neck pain 3. Follow-up in 6 months, call for any changes

## 2016-10-19 ENCOUNTER — Other Ambulatory Visit: Payer: Self-pay | Admitting: Physician Assistant

## 2016-10-19 DIAGNOSIS — J4520 Mild intermittent asthma, uncomplicated: Secondary | ICD-10-CM

## 2016-11-02 ENCOUNTER — Encounter: Payer: Self-pay | Admitting: Physician Assistant

## 2016-11-02 ENCOUNTER — Ambulatory Visit (INDEPENDENT_AMBULATORY_CARE_PROVIDER_SITE_OTHER): Payer: 59 | Admitting: Physician Assistant

## 2016-11-02 ENCOUNTER — Other Ambulatory Visit: Payer: Self-pay | Admitting: Physician Assistant

## 2016-11-02 VITALS — BP 120/56 | HR 107 | Ht 63.0 in | Wt 224.8 lb

## 2016-11-02 DIAGNOSIS — E7800 Pure hypercholesterolemia, unspecified: Secondary | ICD-10-CM

## 2016-11-02 DIAGNOSIS — Z1231 Encounter for screening mammogram for malignant neoplasm of breast: Secondary | ICD-10-CM | POA: Diagnosis not present

## 2016-11-02 DIAGNOSIS — Z23 Encounter for immunization: Secondary | ICD-10-CM | POA: Diagnosis not present

## 2016-11-02 DIAGNOSIS — M797 Fibromyalgia: Secondary | ICD-10-CM

## 2016-11-02 DIAGNOSIS — Z6839 Body mass index (BMI) 39.0-39.9, adult: Secondary | ICD-10-CM

## 2016-11-02 DIAGNOSIS — F329 Major depressive disorder, single episode, unspecified: Secondary | ICD-10-CM

## 2016-11-02 DIAGNOSIS — E1169 Type 2 diabetes mellitus with other specified complication: Secondary | ICD-10-CM | POA: Diagnosis not present

## 2016-11-02 DIAGNOSIS — E669 Obesity, unspecified: Secondary | ICD-10-CM | POA: Diagnosis not present

## 2016-11-02 DIAGNOSIS — J0100 Acute maxillary sinusitis, unspecified: Secondary | ICD-10-CM

## 2016-11-02 DIAGNOSIS — Z0000 Encounter for general adult medical examination without abnormal findings: Secondary | ICD-10-CM

## 2016-11-02 DIAGNOSIS — F419 Anxiety disorder, unspecified: Secondary | ICD-10-CM

## 2016-11-02 LAB — POCT UA - MICROALBUMIN
CREATININE, POC: 300 mg/dL
Microalbumin Ur, POC: 30 mg/L

## 2016-11-02 LAB — POCT GLYCOSYLATED HEMOGLOBIN (HGB A1C): Hemoglobin A1C: 5.8

## 2016-11-02 MED ORDER — GABAPENTIN 300 MG PO CAPS: 300.0000 mg | ORAL_CAPSULE | Freq: Three times a day (TID) | ORAL | 5 refills | Status: DC

## 2016-11-02 MED ORDER — IPRATROPIUM BROMIDE 0.06 % NA SOLN: 1.0000 | Freq: Four times a day (QID) | NASAL | 3 refills | Status: DC | PRN

## 2016-11-02 NOTE — Progress Notes (Signed)
Subjective:     Bridget Johnston is a 56 y.o. female and is here for a comprehensive physical exam. The patient reports problems - she would like a refill on atrovent nasal spray to use as needed to prevent sinus and bronchttisi infection. .  Social History   Social History  . Marital status: Married    Spouse name: N/A  . Number of children: N/A  . Years of education: college   Occupational History  . hairdresser Other   Social History Main Topics  . Smoking status: Former Smoker    Packs/day: 1.00    Years: 17.00    Types: Cigarettes, E-cigarettes    Quit date: 01/06/2012  . Smokeless tobacco: Never Used  . Alcohol use No  . Drug use: No     Comment: opiod adduction  . Sexual activity: Yes    Partners: Male    Birth control/ protection: Surgical     Comment: BTL   Other Topics Concern  . Not on file   Social History Narrative  . No narrative on file   Health Maintenance  Topic Date Due  . OPHTHALMOLOGY EXAM  02/17/1970  . COLONOSCOPY  02/17/2010  . MAMMOGRAM  01/23/2016  . PNEUMOCOCCAL POLYSACCHARIDE VACCINE (1) 02/03/2017 (Originally 02/17/1963)  . Hepatitis C Screening  03/11/2017 (Originally 25-Aug-1960)  . PAP SMEAR  01/03/2017  . HEMOGLOBIN A1C  05/05/2017  . FOOT EXAM  11/02/2017  . URINE MICROALBUMIN  11/02/2017  . TETANUS/TDAP  10/29/2024  . INFLUENZA VACCINE  Completed  . HIV Screening  Completed    The following portions of the patient's history were reviewed and updated as appropriate: allergies, current medications, past family history, past medical history, past social history, past surgical history and problem list.  Review of Systems Pertinent items noted in HPI and remainder of comprehensive ROS otherwise negative.   Objective:    BP (!) 120/56   Pulse (!) 107   Ht 5\' 3"  (1.6 m)   Wt 224 lb 12.8 oz (102 kg)   SpO2 99%   BMI 39.82 kg/m  General appearance: alert, cooperative and appears stated age Head: Normocephalic, without obvious  abnormality, atraumatic Eyes: conjunctivae/corneas clear. PERRL, EOM's intact. Fundi benign. Ears: normal TM's and external ear canals both ears Nose: Nares normal. Septum midline. Mucosa normal. No drainage or sinus tenderness. Throat: lips, mucosa, and tongue normal; teeth and gums normal Neck: no adenopathy, no carotid bruit, no JVD, supple, symmetrical, trachea midline and thyroid not enlarged, symmetric, no tenderness/mass/nodules Back: symmetric, no curvature. ROM normal. No CVA tenderness. Lungs: clear to auscultation bilaterally Heart: regular rate and rhythm, S1, S2 normal, no murmur, click, rub or gallop Abdomen: soft, non-tender; bowel sounds normal; no masses,  no organomegaly Extremities: extremities normal, atraumatic, no cyanosis or edema Pulses: 2+ and symmetric Skin: Skin color, texture, turgor normal. No rashes or lesions Lymph nodes: Cervical, supraclavicular, and axillary nodes normal. Neurologic: Alert and oriented X 3, normal strength and tone. Normal symmetric reflexes. Normal coordination and gait    Assessment:    Healthy female exam.     Plan:  [name] KitchenMarland KitchenAnessia was seen today for annual exam.  Diagnoses and all orders for this visit:  Routine physical examination -     Lipid panel -     COMPLETE METABOLIC PANEL WITH GFR  Diabetes mellitus type 2 in obese (HCC) -     POCT HgB A1C -     POCT UA - Microalbumin  Flu vaccine need -  Flu Vaccine QUAD 6+ mos PF IM (Fluarix Quad PF)  Acute non-recurrent maxillary sinusitis -     ipratropium (ATROVENT) 0.06 % nasal spray; Place 1 spray into both nostrils 4 (four) times daily as needed.  Pure hypercholesterolemia -     Lipid panel  Visit for screening mammogram -     MM DIAG BREAST TOMO BILATERAL; Future -     MM DIAG BREAST TOMO BILATERAL  BMI 39.0-39.9,adult  Other orders -     Discontinue: gabapentin (NEURONTIN) 300 MG capsule; Take 1 capsule (300 mg total) by mouth 3 (three) times daily.  .. Depression  screen [facility] 2/9 11/03/2016 05/29/2016  Decreased Interest 0 0  Down, Depressed, Hopeless 0 0  PHQ - 2 Score 0 0       ... Results for orders placed or performed in visit on 11/02/16  POCT HgB A1C  Result Value Ref Range   Hemoglobin A1C 5.8   POCT UA - Microalbumin  Result Value Ref Range   Microalbumin Ur, POC 30 mg/L   Creatinine, POC 300 mg/dL   Albumin/Creatinine Ratio, Urine, POC <30    .[name] Kitchen Diabetic Foot Exam - Simple   Simple Foot Form Diabetic Foot exam was performed with the following findings:  Yes 11/02/2016  9:00 AM  Visual Inspection No deformities, no ulcerations, no other skin breakdown bilaterally:  Yes Sensation Testing Intact to touch and monofilament testing bilaterally:  Yes Pulse Check Comments    Pt encouraged to get eye exam.  Pt declined pneumonia vaccine and will come back for it.  She did get flu shot today.  . Encouraged vitamin D 1000 units and Calcium 1300mg  or 4 servings of dairy a day.  Mammogram ordered.  Will send for cologuard.  Declined shingles vaccine today.   Discussed weight loss. Discussed medications that can help.  [name] Kitchen.Discussed low carb diet with 1500 calories and 80g of protein.  Exercising at least 150 minutes a week.  My Fitness Pal could be a Chief Technology Officer.  Follow up to get a plan together.  Increased gabapentin to 4 times a day for pain. She is tapering off suboxone and needing more pain relief.     See After Visit Summary for Counseling Recommendations

## 2016-11-02 NOTE — Patient Instructions (Addendum)
saxenda  belviq  .Discussed low carb diet with 1500 calories and 80g of protein.  Exercising at least 150 minutes a week.  My Fitness Pal could be a Chief Technology Officer.    Keeping You Healthy  Get These Tests  Blood Pressure- Have your blood pressure checked by your healthcare provider at least once a year.  Normal blood pressure is 120/80.  Weight- Have your body mass index (BMI) calculated to screen for obesity.  BMI is a measure of body fat based on height and weight.  You can calculate your own BMI at https://www.west-esparza.com/  Cholesterol- Have your cholesterol checked every year.  Diabetes- Have your blood sugar checked every year if you have high blood pressure, high cholesterol, a family history of diabetes or if you are overweight.  Pap Test - Have a pap test every 1 to 5 years if you have been sexually active.  If you are older than 65 and recent pap tests have been normal you may not need additional pap tests.  In addition, if you have had a hysterectomy  for benign disease additional pap tests are not necessary.  Mammogram-Yearly mammograms are essential for early detection of breast cancer  Screening for Colon Cancer- Colonoscopy starting at age 47. Screening may begin sooner depending on your family history and other health conditions.  Follow up colonoscopy as directed by your Gastroenterologist.  Screening for Osteoporosis- Screening begins at age 110 with bone density scanning, sooner if you are at higher risk for developing Osteoporosis.  Get these medicines  Calcium with Vitamin D- Your body requires 1200-1500 mg of Calcium a day and [phone] IU of Vitamin D a day.  You can only absorb 500 mg of Calcium at a time therefore Calcium must be taken in 2 or 3 separate doses throughout the day.  Hormones- Hormone therapy has been associated with increased risk for certain cancers and heart disease.  Talk to your healthcare provider about if you need relief from menopausal  symptoms.  Aspirin- Ask your healthcare provider about taking Aspirin to prevent Heart Disease and Stroke.  Get these Immuniztions  Flu shot- Every fall  Pneumonia shot- Once after the age of 60; if you are younger ask your healthcare provider if you need a pneumonia shot.  Tetanus- Every ten years.  Zostavax- Once after the age of 71 to prevent shingles.  Take these steps  Don't smoke- Your healthcare provider can help you quit. For tips on how to quit, ask your healthcare provider or go to www.smokefree.gov or call 1-800 QUIT-NOW.  Be physically active- Exercise 5 days a week for a minimum of 30 minutes.  If you are not already physically active, start slow and gradually work up to 30 minutes of moderate physical activity.  Try walking, dancing, bike riding, swimming, etc.  Eat a healthy diet- Eat a variety of healthy foods such as fruits, vegetables, whole grains, low fat milk, low fat cheeses, yogurt, lean meats, chicken, fish, eggs, dried beans, tofu, etc.  For more information go to www.thenutritionsource.org  Dental visit- Brush and floss teeth twice daily; visit your dentist twice a year.  Eye exam- Visit your Optometrist or Ophthalmologist yearly.  Drink alcohol in moderation- Limit alcohol intake to one drink or less a day.  Never drink and drive.  Depression- Your emotional health is as important as your physical health.  If you're feeling down or losing interest in things you normally enjoy, please talk to your healthcare provider.  Seat Belts-  can save your life; always wear one  Smoke/Carbon Monoxide detectors- These detectors need to be installed on the appropriate level of your home.  Replace batteries at least once a year.  Violence- If anyone is threatening or hurting you, please tell your healthcare provider.  Living Will/ Health care power of attorney- Discuss with your healthcare provider and family.

## 2016-11-03 ENCOUNTER — Other Ambulatory Visit: Payer: Self-pay | Admitting: Physician Assistant

## 2016-11-03 DIAGNOSIS — J4520 Mild intermittent asthma, uncomplicated: Secondary | ICD-10-CM

## 2016-11-03 DIAGNOSIS — Z6839 Body mass index (BMI) 39.0-39.9, adult: Secondary | ICD-10-CM | POA: Insufficient documentation

## 2016-11-03 MED ORDER — VILAZODONE HCL 40 MG PO TABS: 40.0000 mg | ORAL_TABLET | Freq: Every day | ORAL | 5 refills | Status: DC

## 2016-11-03 MED ORDER — GABAPENTIN 300 MG PO CAPS: 300.0000 mg | ORAL_CAPSULE | Freq: Four times a day (QID) | ORAL | 5 refills | Status: DC

## 2016-11-03 NOTE — Telephone Encounter (Signed)
Can we fax a cologuard order for pt?

## 2016-11-04 ENCOUNTER — Other Ambulatory Visit: Payer: Self-pay | Admitting: Physician Assistant

## 2016-11-04 DIAGNOSIS — J4520 Mild intermittent asthma, uncomplicated: Secondary | ICD-10-CM

## 2016-11-16 ENCOUNTER — Other Ambulatory Visit: Payer: Self-pay | Admitting: Physician Assistant

## 2016-11-16 DIAGNOSIS — J4520 Mild intermittent asthma, uncomplicated: Secondary | ICD-10-CM

## 2016-11-17 ENCOUNTER — Encounter: Payer: Self-pay | Admitting: Physician Assistant

## 2016-11-17 MED ORDER — OMEGA-3-ACID ETHYL ESTERS 1 G PO CAPS: 2.0000 g | ORAL_CAPSULE | Freq: Two times a day (BID) | ORAL | 0 refills | Status: DC

## 2016-11-26 NOTE — Telephone Encounter (Signed)
done

## 2016-11-27 ENCOUNTER — Ambulatory Visit: Payer: 59

## 2016-11-30 ENCOUNTER — Other Ambulatory Visit: Payer: Self-pay | Admitting: Neurology

## 2016-11-30 DIAGNOSIS — R4701 Aphasia: Secondary | ICD-10-CM

## 2016-11-30 DIAGNOSIS — R404 Transient alteration of awareness: Secondary | ICD-10-CM

## 2016-12-04 ENCOUNTER — Ambulatory Visit (INDEPENDENT_AMBULATORY_CARE_PROVIDER_SITE_OTHER): Payer: 59

## 2016-12-04 DIAGNOSIS — Z1231 Encounter for screening mammogram for malignant neoplasm of breast: Secondary | ICD-10-CM

## 2016-12-04 NOTE — Progress Notes (Signed)
Normal mammogram. Follow up in one year.

## 2016-12-14 ENCOUNTER — Other Ambulatory Visit: Payer: Self-pay | Admitting: Physician Assistant

## 2016-12-14 DIAGNOSIS — R4701 Aphasia: Secondary | ICD-10-CM

## 2016-12-14 DIAGNOSIS — R4789 Other speech disturbances: Secondary | ICD-10-CM

## 2016-12-14 DIAGNOSIS — E7800 Pure hypercholesterolemia, unspecified: Secondary | ICD-10-CM

## 2016-12-21 LAB — COMPLETE METABOLIC PANEL WITH GFR
AG RATIO: 2.3 (calc) (ref 1.0–2.5)
ALBUMIN MSPROF: 4.5 g/dL (ref 3.6–5.1)
ALT: 21 U/L (ref 6–29)
AST: 20 U/L (ref 10–35)
Alkaline phosphatase (APISO): 68 U/L (ref 33–130)
BUN: 14 mg/dL (ref 7–25)
CALCIUM: 9.8 mg/dL (ref 8.6–10.4)
CO2: 32 mmol/L (ref 20–32)
Chloride: 106 mmol/L (ref 98–110)
Creat: 0.9 mg/dL (ref 0.50–1.05)
GFR, EST AFRICAN AMERICAN: 83 mL/min/{1.73_m2} (ref >=60)
GFR, EST NON AFRICAN AMERICAN: 71 mL/min/{1.73_m2} (ref >=60)
Globulin: 2 g/dL (calc) (ref 1.9–3.7)
Glucose, Bld: 103 mg/dL — ABNORMAL HIGH (ref 65–99)
POTASSIUM: 4.1 mmol/L (ref 3.5–5.3)
Sodium: 144 mmol/L (ref 135–146)
TOTAL PROTEIN: 6.5 g/dL (ref 6.1–8.1)
Total Bilirubin: 0.7 mg/dL (ref 0.2–1.2)

## 2016-12-21 LAB — LIPID PANEL
CHOLESTEROL: 145 mg/dL (ref <200)
HDL: 63 mg/dL (ref >50)
LDL Cholesterol (Calc): 65 mg/dL (calc)
NON-HDL CHOLESTEROL (CALC): 82 mg/dL (ref <130)
Total CHOL/HDL Ratio: 2.3 (calc) (ref <5.0)
Triglycerides: 86 mg/dL (ref <150)

## 2016-12-27 ENCOUNTER — Other Ambulatory Visit: Payer: Self-pay | Admitting: Neurology

## 2016-12-27 DIAGNOSIS — R4701 Aphasia: Secondary | ICD-10-CM

## 2016-12-27 DIAGNOSIS — R404 Transient alteration of awareness: Secondary | ICD-10-CM

## 2017-01-01 ENCOUNTER — Ambulatory Visit: Payer: 59 | Admitting: Physician Assistant

## 2017-01-01 ENCOUNTER — Ambulatory Visit (INDEPENDENT_AMBULATORY_CARE_PROVIDER_SITE_OTHER): Payer: 59 | Admitting: Physician Assistant

## 2017-01-01 ENCOUNTER — Encounter: Payer: Self-pay | Admitting: Physician Assistant

## 2017-01-01 VITALS — BP 138/78 | HR 94 | Ht 63.0 in | Wt 222.0 lb

## 2017-01-01 DIAGNOSIS — F411 Generalized anxiety disorder: Secondary | ICD-10-CM

## 2017-01-01 DIAGNOSIS — E6609 Other obesity due to excess calories: Secondary | ICD-10-CM | POA: Insufficient documentation

## 2017-01-01 DIAGNOSIS — Z6839 Body mass index (BMI) 39.0-39.9, adult: Secondary | ICD-10-CM | POA: Diagnosis not present

## 2017-01-01 DIAGNOSIS — Z23 Encounter for immunization: Secondary | ICD-10-CM

## 2017-01-01 DIAGNOSIS — I10 Essential (primary) hypertension: Secondary | ICD-10-CM | POA: Diagnosis not present

## 2017-01-01 DIAGNOSIS — Z87898 Personal history of other specified conditions: Secondary | ICD-10-CM | POA: Insufficient documentation

## 2017-01-01 DIAGNOSIS — R5383 Other fatigue: Secondary | ICD-10-CM | POA: Diagnosis not present

## 2017-01-01 DIAGNOSIS — R7303 Prediabetes: Secondary | ICD-10-CM | POA: Insufficient documentation

## 2017-01-01 DIAGNOSIS — J4530 Mild persistent asthma, uncomplicated: Secondary | ICD-10-CM

## 2017-01-01 MED ORDER — LORCASERIN HCL ER 20 MG PO TB24: 1.0000 | ORAL_TABLET | Freq: Every day | ORAL | 1 refills | Status: DC

## 2017-01-01 MED ORDER — BUSPIRONE HCL 5 MG PO TABS: 5.0000 mg | ORAL_TABLET | Freq: Two times a day (BID) | ORAL | 1 refills | Status: DC

## 2017-01-01 NOTE — Progress Notes (Addendum)
Subjective:    Patient ID: Bridget Johnston, female    DOB: 22-Nov-1960, 56 y.o.   MRN: [id number]  HPI  Pt is a 56 yo pleasant female who presents to the clinic for 6 month follow up. She has multiple concerns.   She is most concerned about her fatigue.  She feels like she has not regained her energy since she was hospitalized for pneumonia that turned into encephalitis.  She denies any acute symptoms such as cough, fever, shortness of breath chills.  She does admit to a lot of stress going on in her life.  She continues to work 3 days a week 10-hour days.  Her daughter is in the middle of a divorce and her grandchildren are having to stay with her a lot.  She is taking vitamin D 2000 units daily.  Patient does feel like she is sleeping well at night and waking up feeling rested.  She is also very frustrated with her weight.  She admits she is not exercising like she should or watching what she eats.  She would like something to help with this.  Hypertension-patient is doing well.  She denies any chest pains, palpitations, headaches or vision changes.  Patient has not had any more flares of aphasia.  She does see the neurologist every 6 months.  .. Active Ambulatory Problems    Diagnosis Date Noted  . History of pyelonephritis 10/08/2010  . Opiate addiction (HCC) 01/03/2014  . Asthma 01/03/2014  . Hyperlipidemia 07/23/2014  . Hyperglycemia 07/23/2014  . Vitamin D deficiency 07/23/2014  . Elevated liver enzymes 07/23/2014  . Anxiety and depression 07/23/2014  . Fibromyalgia 07/23/2014  . Echocardiogram shows left ventricular diastolic dysfunction 06/09/2016  . Hypotension due to drugs 06/19/2016  . Aphasia 06/21/2016  . History of ischemic stroke in prior three months 06/21/2016  . Essential hypertension 06/21/2016  . 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 Hyponatremia   . Morbid obesity (HCC)   . Encephalitis   . History of pneumonia 09/10/2016  . History of acute  respiratory failure 09/10/2016  . Peripheral edema 09/10/2016  . Awareness alteration, transient 10/12/2016  . BMI 39.0-39.9,adult 11/03/2016  . Pre-diabetes 01/01/2017  . History of aphasia 01/01/2017  . Class 2 severe obesity due to excess calories with serious comorbidity and body mass index (BMI) of 39.0 to 39.9 in adult (HCC) 01/01/2017  . Fatigue 01/01/2017   Resolved Ambulatory Problems    Diagnosis Date Noted  . UTI 10/08/2010  . Acute respiratory failure with hypoxia (HCC) 06/19/2016  . Diabetes mellitus type 2 in obese [facility](HCC)    Past Medical History:  Diagnosis Date  . Arthritis   . Asthma   . Back pain   . Diabetes mellitus without complication (HCC)   . Finger pain   . Hand pain   . High triglycerides   . Leg pain     Review of Systems  All other systems reviewed and are negative.      Objective:   Physical Exam  Constitutional: She is oriented to person, place, and time. She appears well-developed and well-nourished.  Obese.   HENT:  Head: Normocephalic and atraumatic.  Neck: Normal range of motion. Neck supple. No thyromegaly present.  Cardiovascular: Normal rate, regular rhythm and normal heart sounds.   Pulmonary/Chest: Effort normal and breath sounds normal.  Lymphadenopathy:    She has no cervical adenopathy.  Neurological: She is alert and oriented to person, place, and time.  Psychiatric: She has a normal mood and affect. Her behavior is normal.          Assessment & Plan:  [name] KitchenMarland KitchenIneta was seen today for follow-up.  Diagnoses and all orders for this visit:  Fatigue, unspecified type -     CBC -     C-reactive protein -     Ferritin -     Sedimentation rate -     TSH -     Vitamin B12 -     Vit D  25 hydroxy (rtn osteoporosis monitoring)  Need for pneumococcal vaccine -     Pneumococcal polysaccharide vaccine 23-valent greater than or equal to 2yo subcutaneous/IM  Class 2 severe obesity due to excess calories with serious comorbidity  and body mass index (BMI) of 39.0 to 39.9 in adult (HCC) -     Lorcaserin HCl ER (BELVIQ XR) 20 MG TB24; Take 1 tablet by mouth daily.  Essential hypertension  Mild persistent asthma without complication  History of aphasia  GAD (generalized anxiety disorder) -     busPIRone (BUSPAR) 5 MG tablet; Take 1 tablet (5 mg total) by mouth 2 (two) times daily.   .. GAD 7 : Generalized Anxiety Score 01/01/2017  Nervous, Anxious, on Edge 2  Control/stop worrying 3  Worry too much - different things 2  Trouble relaxing 3  Restless 1  Easily annoyed or irritable 2  Afraid - awful might happen 2  Total GAD 7 Score 15  Anxiety Difficulty Very difficult    Discussed causes of fatigue with patient.  I certainly think that her anxiety and stress could be causing some of her fatigue.  She has been well controlled on Viibryd for many years.  We will add BuSpar twice a day to see if this could help with some of her anxiety.  Strongly encouraged counseling as her family is going through this difficult time.  Also discussed how exercise can be very beneficial for anxiety.  We will also get a fatigue lab panel.  Follow-up in 6 weeks. We could consider sleep apnea testing as well in future.   .Discussed low carb diet with 1500 calories and 80g of protein.  Exercising at least 150 minutes a week.  [employer]itness Pal could be a [employer].  Started Bovie daily.  Discussed side effects.  Follow-up in 6 weeks.  Follow up with neurologist for aphasia.   Pt brings in colonoscopy report and good for 10 years. Will scan into system.

## 2017-01-01 NOTE — Patient Instructions (Signed)
..  Discussed low carb diet with 1500 calories and 80g of protein.  Exercising at least 150 minutes a week.  [employer]itness Pal could be a great resource.   

## 2017-01-02 ENCOUNTER — Encounter: Payer: Self-pay | Admitting: Physician Assistant

## 2017-01-02 LAB — CBC
HEMATOCRIT: 39 % (ref 35.0–45.0)
HEMOGLOBIN: 13.7 g/dL (ref 11.7–15.5)
MCH: 32.8 pg (ref 27.0–33.0)
MCHC: 35.1 g/dL (ref 32.0–36.0)
MCV: 93.3 fL (ref 80.0–100.0)
MPV: 10.7 fL (ref 7.5–12.5)
PLATELETS: 316 10*3/uL (ref 140–400)
RBC: 4.18 10*6/uL (ref 3.80–5.10)
RDW: 11.8 % (ref 11.0–15.0)
WBC: 5.8 10*3/uL (ref 3.8–10.8)

## 2017-01-02 LAB — VITAMIN B12

## 2017-01-02 LAB — SEDIMENTATION RATE: Sed Rate: 9 mm/h (ref 0–30)

## 2017-01-02 LAB — C-REACTIVE PROTEIN: CRP: 0.6 mg/L (ref <8.0)

## 2017-01-02 LAB — VITAMIN D 25 HYDROXY (VIT D DEFICIENCY, FRACTURES): Vit D, 25-Hydroxy: 30 ng/mL (ref 30–100)

## 2017-01-02 LAB — FERRITIN: Ferritin: 33 ng/mL (ref 10–232)

## 2017-01-02 LAB — TSH: TSH: 0.57 mIU/L (ref 0.40–4.50)

## 2017-01-04 ENCOUNTER — Other Ambulatory Visit: Payer: Self-pay | Admitting: Physician Assistant

## 2017-01-04 DIAGNOSIS — J4520 Mild intermittent asthma, uncomplicated: Secondary | ICD-10-CM

## 2017-01-04 NOTE — Progress Notes (Signed)
Call pt: labs look good.  Vitamin D on low normal. Increase to 4000units daily. B12 is too high though. Whatever supplements you are taking cut in half. Recheck in 3 months.   Do you need refills of thyroid medication. If so given 6 months. Just into normal range let's keep a little closer eye on it.

## 2017-01-05 ENCOUNTER — Telehealth: Payer: Self-pay | Admitting: *Deleted

## 2017-01-05 ENCOUNTER — Other Ambulatory Visit: Payer: Self-pay | Admitting: Physician Assistant

## 2017-01-05 DIAGNOSIS — E7800 Pure hypercholesterolemia, unspecified: Secondary | ICD-10-CM

## 2017-01-05 DIAGNOSIS — R4789 Other speech disturbances: Secondary | ICD-10-CM

## 2017-01-05 DIAGNOSIS — R4701 Aphasia: Secondary | ICD-10-CM

## 2017-01-05 NOTE — Telephone Encounter (Signed)
This medication may be excluded from the patient's benefit. For more information, please reach out to [facility] directly at [phone]. Message from [facility]: Drug is not covered by plan. Called [facility] and confirmed that drug is definitely a policy exclusion. Called patient and let her know and advised to call her insurance and see if there is any weight loss  medications they do cover. Patient voiced understanding and will call us back to let us know

## 2017-01-05 NOTE — Telephone Encounter (Signed)
Pre Authorization sent to cover my meds.[id number]

## 2017-01-11 MED ORDER — BUDESONIDE-FORMOTEROL FUMARATE 160-4.5 MCG/ACT IN AERO: 2.0000 | INHALATION_SPRAY | Freq: Two times a day (BID) | RESPIRATORY_TRACT | 1 refills | Status: DC

## 2017-01-11 MED ORDER — OMEGA-3-ACID ETHYL ESTERS 1 G PO CAPS: 2.0000 | ORAL_CAPSULE | Freq: Two times a day (BID) | ORAL | 3 refills | Status: DC

## 2017-01-11 MED ORDER — ATORVASTATIN CALCIUM 40 MG PO TABS: 40.0000 mg | ORAL_TABLET | Freq: Every day | ORAL | 3 refills | Status: DC

## 2017-01-13 ENCOUNTER — Other Ambulatory Visit: Payer: Self-pay | Admitting: Physician Assistant

## 2017-01-13 DIAGNOSIS — R4701 Aphasia: Secondary | ICD-10-CM

## 2017-01-13 DIAGNOSIS — E7800 Pure hypercholesterolemia, unspecified: Secondary | ICD-10-CM

## 2017-01-13 DIAGNOSIS — R4789 Other speech disturbances: Secondary | ICD-10-CM

## 2017-01-18 ENCOUNTER — Other Ambulatory Visit: Payer: Self-pay | Admitting: Physician Assistant

## 2017-01-18 DIAGNOSIS — J0100 Acute maxillary sinusitis, unspecified: Secondary | ICD-10-CM

## 2017-02-03 ENCOUNTER — Telehealth: Payer: Self-pay | Admitting: *Deleted

## 2017-02-03 NOTE — Telephone Encounter (Signed)
Received notification from pharmacy that Viibryd needs a prior [place]auth. Response back from the insurance was Drug is covered by current benefit plan. No further PA activity needed. Faxed notification to insurance company

## 2017-02-06 ENCOUNTER — Other Ambulatory Visit: Payer: Self-pay | Admitting: Physician Assistant

## 2017-02-06 DIAGNOSIS — J0100 Acute maxillary sinusitis, unspecified: Secondary | ICD-10-CM

## 2017-02-11 ENCOUNTER — Other Ambulatory Visit: Payer: Self-pay | Admitting: Physician Assistant

## 2017-02-25 ENCOUNTER — Other Ambulatory Visit: Payer: Self-pay | Admitting: Physician Assistant

## 2017-02-25 ENCOUNTER — Encounter: Payer: Self-pay | Admitting: Physician Assistant

## 2017-02-25 DIAGNOSIS — Z6839 Body mass index (BMI) 39.0-39.9, adult: Secondary | ICD-10-CM

## 2017-02-25 DIAGNOSIS — F411 Generalized anxiety disorder: Secondary | ICD-10-CM

## 2017-02-26 ENCOUNTER — Other Ambulatory Visit: Payer: Self-pay | Admitting: Physician Assistant

## 2017-02-26 DIAGNOSIS — J0100 Acute maxillary sinusitis, unspecified: Secondary | ICD-10-CM

## 2017-02-26 MED ORDER — BUSPIRONE HCL 10 MG PO TABS: 10.0000 mg | ORAL_TABLET | Freq: Two times a day (BID) | ORAL | 1 refills | Status: DC

## 2017-02-26 MED ORDER — LORCASERIN HCL ER 20 MG PO TB24: 1.0000 | ORAL_TABLET | Freq: Every day | ORAL | 0 refills | Status: DC

## 2017-02-26 MED ORDER — BUSPIRONE HCL 5 MG PO TABS: 5.0000 mg | ORAL_TABLET | Freq: Two times a day (BID) | ORAL | 1 refills | Status: DC

## 2017-02-26 NOTE — Addendum Note (Signed)
Addended by: Jomarie LongsBREEBACK, JADE L on: 02/26/2017 09:47 AM   Modules accepted: Orders

## 2017-03-05 ENCOUNTER — Ambulatory Visit (INDEPENDENT_AMBULATORY_CARE_PROVIDER_SITE_OTHER): Payer: 59 | Admitting: Physician Assistant

## 2017-03-05 ENCOUNTER — Encounter: Payer: Self-pay | Admitting: Physician Assistant

## 2017-03-05 DIAGNOSIS — Z6839 Body mass index (BMI) 39.0-39.9, adult: Secondary | ICD-10-CM

## 2017-03-05 MED ORDER — LORCASERIN HCL ER 20 MG PO TB24: 1.0000 | ORAL_TABLET | Freq: Every day | ORAL | 1 refills | Status: DC

## 2017-03-05 NOTE — Progress Notes (Signed)
   Subjective:    Patient ID: Bridget AtesMona S Johnston, female    DOB: [birth year], 56 y.o.   MRN: [id number]  HPI Pt is a 56 yo female who presents to the clinic to follow up on weight loss. She started belviq not quite 2 months ago. She denies any side effects. She has lost 11lbs. She feels great. She is much less hungry and craving food less. She is not exercising regularly but when she gets a chance she is walking some.   ... Active Ambulatory Problems    Diagnosis Date Noted  . History of pyelonephritis 10/08/2010  . Opiate addiction (HCC) 01/03/2014  . Asthma 01/03/2014  . Hyperlipidemia 07/23/2014  . Hyperglycemia 07/23/2014  . Vitamin D deficiency 07/23/2014  . Elevated liver enzymes 07/23/2014  . Anxiety and depression 07/23/2014  . Fibromyalgia 07/23/2014  . Echocardiogram shows left ventricular diastolic dysfunction 06/09/2016  . Hypotension due to drugs 06/19/2016  . Aphasia 06/21/2016  . History of ischemic stroke in prior three months 06/21/2016  . Essential hypertension 06/21/2016  . 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 Hyponatremia   . Morbid obesity (HCC)   . Encephalitis   . History of pneumonia 09/10/2016  . History of acute respiratory failure 09/10/2016  . Peripheral edema 09/10/2016  . Awareness alteration, transient 10/12/2016  . BMI 39.0-39.9,adult 11/03/2016  . Pre-diabetes 01/01/2017  . History of aphasia 01/01/2017  . Class 2 severe obesity due to excess calories with serious comorbidity and body mass index (BMI) of 39.0 to 39.9 in adult (HCC) 01/01/2017  . Fatigue 01/01/2017   Resolved Ambulatory Problems    Diagnosis Date Noted  . UTI 10/08/2010  . Acute respiratory failure with hypoxia (HCC) 06/19/2016  . Diabetes mellitus type 2 in obese [facility](HCC)    Past Medical History:  Diagnosis Date  . Arthritis   . Asthma   . Back pain   . Diabetes mellitus without complication (HCC)   . Finger pain   . Hand pain   . High triglycerides   . Leg pain        Review of Systems  All other systems reviewed and are negative.      Objective:   Physical Exam  Constitutional: She is oriented to person, place, and time. She appears well-developed and well-nourished.  Obese.   HENT:  Head: Normocephalic and atraumatic.  Cardiovascular: Normal rate, regular rhythm and normal heart sounds.  Pulmonary/Chest: Effort normal and breath sounds normal. She has no wheezes.  Neurological: She is alert and oriented to person, place, and time.  Psychiatric: She has a normal mood and affect. Her behavior is normal.          Assessment & Plan:  [name] Kitchen.[name] Kitchen.Bridget Johnston was seen today for 2 month follow up and med and lab check.  Diagnoses and all orders for this visit:  Class 2 severe obesity due to excess calories with serious comorbidity and body mass index (BMI) of 39.0 to 39.9 in adult (HCC) -     Lorcaserin HCl ER (BELVIQ XR) 20 MG TB24; Take 1 tablet by mouth daily.   Pt is doing great. Down 11lbs.  Consider adding in regular exercise.  Refilled and follow up in 6 months.

## 2017-03-10 ENCOUNTER — Telehealth: Payer: Self-pay | Admitting: *Deleted

## 2017-03-10 NOTE — Telephone Encounter (Signed)
There is a denial on file for Belviq but ran it through insurance since it's a new year and it states drug is not covered by plan.Information regarding your request  Message from [facility]: Drug is not covered by plan

## 2017-03-30 ENCOUNTER — Telehealth: Payer: Self-pay | Admitting: Physician Assistant

## 2017-03-30 NOTE — Telephone Encounter (Signed)
Received fax for Viibryd sent through cover my meds and received a no auth needed message called insurance and they stated that was correct the medication does not require a pa they gave me the number to the pharmacy help desk to give to [facility] so they can get a paid claim for the medication. I called [facility] and gave them the information. - CF

## 2017-04-16 ENCOUNTER — Ambulatory Visit: Payer: 59 | Admitting: Neurology

## 2017-04-25 ENCOUNTER — Other Ambulatory Visit: Payer: Self-pay | Admitting: Physician Assistant

## 2017-04-27 ENCOUNTER — Other Ambulatory Visit: Payer: Self-pay | Admitting: Physician Assistant

## 2017-04-27 ENCOUNTER — Encounter: Payer: Self-pay | Admitting: Physician Assistant

## 2017-04-27 DIAGNOSIS — J0100 Acute maxillary sinusitis, unspecified: Secondary | ICD-10-CM

## 2017-04-27 MED ORDER — BUSPIRONE HCL 10 MG PO TABS: 10.0000 mg | ORAL_TABLET | Freq: Two times a day (BID) | ORAL | 1 refills | Status: DC

## 2017-04-30 ENCOUNTER — Other Ambulatory Visit: Payer: Self-pay | Admitting: Physician Assistant

## 2017-04-30 ENCOUNTER — Encounter: Payer: Self-pay | Admitting: Physician Assistant

## 2017-04-30 DIAGNOSIS — J0100 Acute maxillary sinusitis, unspecified: Secondary | ICD-10-CM

## 2017-04-30 DIAGNOSIS — F329 Major depressive disorder, single episode, unspecified: Secondary | ICD-10-CM

## 2017-04-30 DIAGNOSIS — F419 Anxiety disorder, unspecified: Secondary | ICD-10-CM

## 2017-04-30 DIAGNOSIS — F32A Depression, unspecified: Secondary | ICD-10-CM

## 2017-04-30 MED ORDER — VILAZODONE HCL 40 MG PO TABS: 40.0000 mg | ORAL_TABLET | Freq: Every day | ORAL | 3 refills | Status: DC

## 2017-05-07 ENCOUNTER — Encounter: Payer: Self-pay | Admitting: Physician Assistant

## 2017-05-07 NOTE — Telephone Encounter (Signed)
Routed to Provider in separate request.

## 2017-05-07 NOTE — Telephone Encounter (Signed)
Pt just got 120 on 02/11/17. I don't think she should need anymore. Just takes as needed.

## 2017-05-15 ENCOUNTER — Encounter: Payer: Self-pay | Admitting: Physician Assistant

## 2017-05-17 ENCOUNTER — Encounter: Payer: Self-pay | Admitting: Physician Assistant

## 2017-05-17 MED ORDER — GABAPENTIN 300 MG PO CAPS: 300.0000 mg | ORAL_CAPSULE | Freq: Four times a day (QID) | ORAL | 2 refills | Status: DC

## 2017-06-06 ENCOUNTER — Encounter: Payer: Self-pay | Admitting: Physician Assistant

## 2017-06-06 ENCOUNTER — Other Ambulatory Visit: Payer: Self-pay | Admitting: Physician Assistant

## 2017-06-07 MED ORDER — FUROSEMIDE 20 MG PO TABS: ORAL_TABLET | ORAL | 0 refills | Status: DC

## 2017-06-28 ENCOUNTER — Other Ambulatory Visit: Payer: Self-pay | Admitting: Physician Assistant

## 2017-06-28 ENCOUNTER — Encounter: Payer: Self-pay | Admitting: Physician Assistant

## 2017-06-29 ENCOUNTER — Telehealth: Payer: Self-pay | Admitting: Physician Assistant

## 2017-06-29 NOTE — Telephone Encounter (Signed)
Drug is covered by current benefit plan. No further PA activity needed. Also pharmacy notified.

## 2017-07-05 ENCOUNTER — Encounter: Payer: Self-pay | Admitting: Physician Assistant

## 2017-07-05 MED ORDER — BUDESONIDE-FORMOTEROL FUMARATE 160-4.5 MCG/ACT IN AERO: 2.0000 | INHALATION_SPRAY | Freq: Two times a day (BID) | RESPIRATORY_TRACT | 1 refills | Status: DC

## 2017-07-28 ENCOUNTER — Encounter: Payer: Self-pay | Admitting: Physician Assistant

## 2017-07-29 ENCOUNTER — Encounter: Payer: Self-pay | Admitting: Physician Assistant

## 2017-07-29 ENCOUNTER — Ambulatory Visit (INDEPENDENT_AMBULATORY_CARE_PROVIDER_SITE_OTHER): Payer: 59 | Admitting: Physician Assistant

## 2017-07-29 VITALS — BP 122/71 | HR 92 | Temp 98.7°F | Resp 14 | Ht 62.99 in | Wt 198.0 lb

## 2017-07-29 DIAGNOSIS — J4531 Mild persistent asthma with (acute) exacerbation: Secondary | ICD-10-CM

## 2017-07-29 DIAGNOSIS — J0100 Acute maxillary sinusitis, unspecified: Secondary | ICD-10-CM

## 2017-07-29 MED ORDER — DEXAMETHASONE 4 MG PO TABS: 4.0000 mg | ORAL_TABLET | Freq: Three times a day (TID) | ORAL | 0 refills | Status: DC

## 2017-07-29 MED ORDER — METHYLPREDNISOLONE ACETATE 80 MG/ML IJ SUSP
160.0000 mg | Freq: Once | INTRAMUSCULAR | Status: AC
  Administered 2017-07-29: 160 mg via INTRAMUSCULAR

## 2017-07-29 MED ORDER — FLUCONAZOLE 150 MG PO TABS: 150.0000 mg | ORAL_TABLET | Freq: Once | ORAL | 0 refills | Status: DC | PRN

## 2017-07-29 MED ORDER — DOXYCYCLINE HYCLATE 100 MG PO TABS: 100.0000 mg | ORAL_TABLET | Freq: Two times a day (BID) | ORAL | 0 refills | Status: AC

## 2017-07-29 NOTE — Patient Instructions (Addendum)
- take antibiotic twice a day for 1 week - take Dexamethasone (steroid) three times daily with a meal for 5 days - use your Albuterol inhaler 2 puffs every 4-6 hours. Definitely use first thing in the morning and before bed   Chronic Obstructive Pulmonary Disease Exacerbation Chronic obstructive pulmonary disease (COPD) is a long-term (chronic) condition that affects the lungs. COPD is a general term that can be used to describe many different lung problems that cause lung swelling (inflammation) and limit airflow, including chronic bronchitis and emphysema. COPD exacerbations are episodes when breathing symptoms become much worse and require extra treatment. COPD exacerbations are usually caused by infections. Without treatment, COPD exacerbations can be severe and even life threatening. Frequent COPD exacerbations can cause further damage to the lungs. What are the causes? This condition may be caused by:  Respiratory infections, including viral and bacterial infections.  Exposure to smoke.  Exposure to air pollution, chemical fumes, or dust.  Things that give you an allergic reaction (allergens).  Not taking your usual COPD medicines as directed.  Underlying medical problems, such as congestive heart failure or infections not involving the lungs.  In many cases, the cause (trigger) of this condition is not known. What increases the risk? The following factors may make you more likely to develop this condition:  Smoking cigarettes.  Old age.  Frequent prior COPD exacerbations.  What are the signs or symptoms? Symptoms of this condition include:  Increased coughing.  Increased production of mucus from your lungs (sputum).  Increased wheezing.  Increased shortness of breath.  Rapid or labored breathing.  Chest tightness.  Less energy than usual.  Sleep disruption from symptoms.  Confusion or increased sleepiness.  Often these symptoms happen or get worse even  with the use of medicines. How is this diagnosed? This condition is diagnosed based on:  Your medical history.  A physical exam.  You may also have tests, including:  A chest X-ray.  Blood tests.  Lung (pulmonary) function tests.  How is this treated? Treatment for this condition depends on the severity and cause of the symptoms. You may need to be admitted to a hospital for treatment. Some of the treatments commonly used to treat COPD exacerbations are:  Antibiotic medicines. These may be used for severe exacerbations caused by a lung infection, such as pneumonia.  Bronchodilators. These are inhaled medicines that expand the air passages and allow increased airflow.  Steroid medicines. These act to reduce inflammation in the airways. They may be given with an inhaler, taken by mouth, or given through an IV tube inserted into one of your veins.  Supplemental oxygen therapy.  Airway clearing techniques, such as noninvasive ventilation (NIV) and positive expiratory pressure (PEP). These provide respiratory support through a mask or other noninvasive device. An example of this would be using a continuous positive airway pressure (CPAP) machine to improve delivery of oxygen into your lungs.  Follow these instructions at home: Medicines  Take over-the-counter and prescription medicines only as told by your health care provider. It is important to use correct technique with inhaled medicines.  If you were prescribed an antibiotic medicine or oral steroid, take it as told by your health care provider. Do not stop taking the medicine even if you start to feel better. Lifestyle  Eat a healthy diet.  Exercise regularly.  Get plenty of sleep.  Avoid exposure to all substances that irritate the airway, especially to tobacco smoke.  Wash your hands often with soap  and water to reduce the risk of infection. If soap and water are not available, use hand sanitizer.  During flu season,  avoid enclosed spaces that are crowded with people. General instructions  Drink enough fluid to keep your urine clear or pale yellow (unless you have a medical condition that requires fluid restriction).  Use a cool mist vaporizer. This humidifies the air and makes it easier for you to clear your chest when you cough.  If you have a home nebulizer and oxygen, continue to use them as told by your health care provider.  Keep all follow-up visits as told by your health care provider. This is important. How is this prevented?  Stay up-to-date on pneumococcal and influenza (flu) vaccines. A flu shot is recommended every year to help prevent exacerbations.  Do not use any products that contain nicotine or tobacco, such as cigarettes and e-cigarettes. Quitting smoking is very important in preventing COPD from getting worse and in preventing exacerbations from happening as often. If you need help quitting, ask your health care provider.  Follow all instructions for pulmonary rehabilitation after a recent exacerbation. This can help prevent future exacerbations.  Work with your health care provider to develop and follow an action plan. This tells you what steps to take when you experience certain symptoms. Contact a health care provider if:  You have a worsening of your regular COPD symptoms. Get help right away if:  You have worsening shortness of breath, even when resting.  You have trouble talking.  You have severe chest pain.  You cough up blood.  You have a fever.  You have weakness, vomit repeatedly, or faint.  You feel confused.  You are not able to sleep because of your symptoms.  You have trouble doing daily activities. Summary  COPD exacerbations are episodes when breathing symptoms become much worse and require extra treatment above your normal treatment.  Exacerbations can be severe and even life threatening. Frequent COPD exacerbations can cause further damage to your  lungs.  COPD exacerbations are usually triggered by infections such as the flu, colds, and even pneumonia.  Treatment for this condition depends on the severity and cause of the symptoms. You may need to be admitted to a hospital for treatment.  Quitting smoking is very important to prevent COPD from getting worse and to prevent exacerbations from happening as often. This information is not intended to replace advice given to you by your health care provider. Make sure you discuss any questions you have with your health care provider. Document Released: 12/21/2006 Document Revised: 03/30/2016 Document Reviewed: 03/30/2016 Elsevier Interactive Patient Education  [employer].

## 2017-07-29 NOTE — Telephone Encounter (Signed)
Spoke with Pt and scheduled her an appointment for today.

## 2017-07-29 NOTE — Progress Notes (Signed)
HPI:                                                                Bridget Johnston is a 57 y.o. female who presents to [facility] today for cough and congestion  Cough  This is a new problem. The cough is productive of purulent sputum. Associated symptoms include chest pain, a fever (100.7), nasal congestion and a sore throat. Pertinent negatives include no hemoptysis, shortness of breath or wheezing. Nothing aggravates the symptoms. She has tried OTC cough suppressant (oral decongestant) for the symptoms. Her past medical history is significant for asthma, COPD and pneumonia. + acute respiratory failure    Past Medical History:  Diagnosis Date  . Arthritis   . Asthma   . Back pain   . Diabetes mellitus without complication (HCC)   . Finger pain   . Hand pain   . High triglycerides   . Leg pain    Past Surgical History:  Procedure Laterality Date  . ABLATION  2007  . BTL  1988  . NOVASURE ABLATION    . TONSILLECTOMY    . TUBAL LIGATION     Social History   Tobacco Use  . Smoking status: Former Smoker    Packs/day: 1.00    Years: 17.00    Pack years: 17.00    Types: Cigarettes, E-cigarettes    Last attempt to quit: 01/06/2012    Years since quitting: 5.5  . Smokeless tobacco: Never Used  Substance Use Topics  . Alcohol use: No    Alcohol/week: 0.0 oz   family history includes Arthritis in her unknown relative; Cancer - Colon in her maternal grandfather; Diabetes in her father and unknown relative; Heart disease in her father and mother; Hypertension in her father and mother.    ROS: negative except as noted in the HPI  Medications: Current Outpatient Medications  Medication Sig Dispense Refill  . AMBULATORY NON FORMULARY MEDICATION Take 1 tablet by mouth daily. Patient reports taking wheat grass to help control her glucose.     [name] Kitchen atorvastatin (LIPITOR) 40 MG tablet Take 1 tablet (40 mg total) daily by mouth. 90  tablet 3  . budesonide-formoterol (SYMBICORT) 160-4.5 MCG/ACT inhaler Inhale 2 puffs into the lungs 2 (two) times daily. 30.6 g 1  . busPIRone (BUSPAR) 10 MG tablet TAKE 1 TABLET BY MOUTH TWICE DAILY 60 tablet 1  . cholecalciferol (VITAMIN D) 1000 units tablet Take 2,000 Units by mouth daily.    . fexofenadine (ALLEGRA) 30 MG tablet Take 30 mg by mouth 2 (two) times daily.    . furosemide (LASIX) 20 MG tablet TAKE 1 TABLET BY MOUTH DAILY IF NEEDED FOR FLUID RETENTION OR EDEMA 120 tablet 0  . gabapentin (NEURONTIN) 300 MG capsule Take 1 capsule (300 mg total) by mouth 4 (four) times daily. 120 capsule 2  . ipratropium (ATROVENT) 0.06 % nasal spray USE 1 SPRAY IN EACH NOSTRIL FOUR TIMES DAILY AS NEEDED 15 mL 0  . Lorcaserin HCl ER (BELVIQ XR) 20 MG TB24 Take 1 tablet by mouth daily. 90 tablet 1  . omega-3 acid ethyl esters (LOVAZA) 1 g capsule Take 2 capsules (2 g total) 2 (two) times daily by mouth. 360 capsule 3  .  PROAIR HFA 108 (90 Base) MCG/ACT inhaler INHALE 2 PUFFS BY MOUTH EVERY 4 HOURS IF NEEDED FOR WHEEZING OR SHORTNESS OF BREATH 8.5 g 2  . SUBOXONE 8-2 MG FILM Take 0.5 Film by mouth 2 (two) times daily.     . Vilazodone HCl (VIIBRYD) 40 MG TABS Take 1 tablet (40 mg total) by mouth daily. 30 tablet 3  . zonisamide (ZONEGRAN) 100 MG capsule Take 2 capsules (200 mg total) by mouth at bedtime. 60 capsule 11   No current facility-administered medications for this visit.    Allergies  Allergen Reactions  . Prednisone Hypertension       Objective:  BP 122/71   Pulse 92   Temp 98.7 F (37.1 C)   Ht 5' 2.99" (1.6 m)   Wt 198 lb (89.8 kg)   SpO2 97%   BMI 35.08 kg/m  Gen:  alert, not ill-appearing, no distress, appropriate for age, obese female HEENT: head normocephalic without obvious abnormality, conjunctiva and cornea clear, wearing glasses, TM's pearly gray and semi-transparent, nasal mucosa edematous, there is left maxillary sinus tenderness, oropharynx clear, moist mucous  membranes, neck supple, no cervical adenopathy, trachea midline Pulm: Normal work of breathing, voice is hoarse, breath sounds are diffusely coarse on expiration CV: Normal rate, regular rhythm, s1 and s2 distinct, no murmurs, clicks or rubs  Neuro: alert and oriented x 3, no tremor MSK: extremities atraumatic, normal gait and station Skin: intact, no rashes on exposed skin, no jaundice, no cyanosis Psych: well-groomed, cooperative, good eye contact, euthymic mood, affect mood-congruent, speech is articulate, and thought processes clear and goal-directed    No results found for this or any previous visit (from the past 72 hour(s)). No results found.    Assessment and Plan: 57 y.o. female with   Mild persistent asthma with acute exacerbation - Plan: doxycycline (VIBRA-TABS) 100 MG tablet, dexamethasone (DECADRON) 4 MG tablet, methylPREDNISolone acetate (DEPO-MEDROL) injection 160 mg  Acute non-recurrent maxillary sinusitis  SpO2 97% on RA at rest, currently afebrile, self-reported fever of 100.7 has taken tylenol, no tachypnea, no tachycardia. Treating as asthma/COPD exacerbation due to underlying pulmonary disease- patient is on Symbicort daily and has history of smoking and pulmonary scarring on CXR, but no PFT's, so diagnosis is unclear Patient reports severe hypertension with Prednisone. IM Depo-Medrol 160 mg given in office and Decadron 4 mg tid PO x 5 days. Counseled to monitor BP's at home Doxycycline to cover for sinusitis and PNA   Patient education and anticipatory guidance given Patient agrees with treatment plan Follow-up as needed if symptoms worsen or fail to improve  Levonne Hubert PA-C

## 2017-08-09 ENCOUNTER — Other Ambulatory Visit: Payer: Self-pay | Admitting: Physician Assistant

## 2017-08-19 ENCOUNTER — Encounter

## 2017-08-19 ENCOUNTER — Other Ambulatory Visit: Payer: Self-pay

## 2017-08-19 ENCOUNTER — Ambulatory Visit (INDEPENDENT_AMBULATORY_CARE_PROVIDER_SITE_OTHER): Payer: 59 | Admitting: Neurology

## 2017-08-19 ENCOUNTER — Encounter: Payer: Self-pay | Admitting: Neurology

## 2017-08-19 DIAGNOSIS — R4701 Aphasia: Secondary | ICD-10-CM

## 2017-08-19 DIAGNOSIS — R404 Transient alteration of awareness: Secondary | ICD-10-CM

## 2017-08-19 MED ORDER — ZONISAMIDE 100 MG PO CAPS: 200.0000 mg | ORAL_CAPSULE | Freq: Every day | ORAL | 11 refills | Status: DC
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
## 2017-08-23 ENCOUNTER — Other Ambulatory Visit: Payer: Self-pay | Admitting: Physician Assistant

## 2017-08-26 ENCOUNTER — Other Ambulatory Visit: Payer: Self-pay | Admitting: Physician Assistant

## 2017-08-26 DIAGNOSIS — F419 Anxiety disorder, unspecified: Secondary | ICD-10-CM

## 2017-08-26 DIAGNOSIS — F329 Major depressive disorder, single episode, unspecified: Secondary | ICD-10-CM

## 2017-09-03 ENCOUNTER — Encounter: Payer: Self-pay | Admitting: Physician Assistant

## 2017-09-03 ENCOUNTER — Ambulatory Visit (INDEPENDENT_AMBULATORY_CARE_PROVIDER_SITE_OTHER): Payer: 59 | Admitting: Physician Assistant

## 2017-09-03 VITALS — BP 116/66 | HR 81 | Wt 199.0 lb

## 2017-09-03 DIAGNOSIS — R600 Localized edema: Secondary | ICD-10-CM

## 2017-09-03 DIAGNOSIS — I10 Essential (primary) hypertension: Secondary | ICD-10-CM | POA: Diagnosis not present

## 2017-09-03 DIAGNOSIS — E7800 Pure hypercholesterolemia, unspecified: Secondary | ICD-10-CM

## 2017-09-03 DIAGNOSIS — R404 Transient alteration of awareness: Secondary | ICD-10-CM

## 2017-09-03 DIAGNOSIS — F329 Major depressive disorder, single episode, unspecified: Secondary | ICD-10-CM

## 2017-09-03 DIAGNOSIS — F32A Depression, unspecified: Secondary | ICD-10-CM

## 2017-09-03 DIAGNOSIS — J0100 Acute maxillary sinusitis, unspecified: Secondary | ICD-10-CM

## 2017-09-03 DIAGNOSIS — Z1159 Encounter for screening for other viral diseases: Secondary | ICD-10-CM | POA: Diagnosis not present

## 2017-09-03 DIAGNOSIS — R4701 Aphasia: Secondary | ICD-10-CM

## 2017-09-03 DIAGNOSIS — R7303 Prediabetes: Secondary | ICD-10-CM | POA: Diagnosis not present

## 2017-09-03 DIAGNOSIS — Z6839 Body mass index (BMI) 39.0-39.9, adult: Secondary | ICD-10-CM

## 2017-09-03 DIAGNOSIS — F419 Anxiety disorder, unspecified: Secondary | ICD-10-CM

## 2017-09-03 DIAGNOSIS — R4789 Other speech disturbances: Secondary | ICD-10-CM

## 2017-09-03 DIAGNOSIS — J4520 Mild intermittent asthma, uncomplicated: Secondary | ICD-10-CM

## 2017-09-03 DIAGNOSIS — Z0184 Encounter for antibody response examination: Secondary | ICD-10-CM | POA: Diagnosis not present

## 2017-09-03 DIAGNOSIS — E119 Type 2 diabetes mellitus without complications: Secondary | ICD-10-CM | POA: Insufficient documentation

## 2017-09-03 LAB — POCT GLYCOSYLATED HEMOGLOBIN (HGB A1C): HEMOGLOBIN A1C: 5.5 % (ref 4.0–5.6)

## 2017-09-03 MED ORDER — IPRATROPIUM BROMIDE 0.06 % NA SOLN: NASAL | 5 refills | Status: DC

## 2017-09-03 MED ORDER — LORCASERIN HCL ER 20 MG PO TB24: 1.0000 | ORAL_TABLET | Freq: Every day | ORAL | 5 refills | Status: DC

## 2017-09-03 MED ORDER — ALBUTEROL SULFATE HFA 108 (90 BASE) MCG/ACT IN AERS: INHALATION_SPRAY | RESPIRATORY_TRACT | 2 refills | Status: DC

## 2017-09-03 MED ORDER — ZONISAMIDE 100 MG PO CAPS: 200.0000 mg | ORAL_CAPSULE | Freq: Every day | ORAL | 11 refills | Status: DC

## 2017-09-03 MED ORDER — GABAPENTIN 300 MG PO CAPS: ORAL_CAPSULE | ORAL | 5 refills | Status: DC

## 2017-09-03 MED ORDER — FUROSEMIDE 20 MG PO TABS: ORAL_TABLET | ORAL | 5 refills | Status: DC

## 2017-09-03 MED ORDER — VILAZODONE HCL 40 MG PO TABS: 40.0000 mg | ORAL_TABLET | Freq: Every day | ORAL | 5 refills | Status: DC

## 2017-09-03 MED ORDER — ATORVASTATIN CALCIUM 40 MG PO TABS: 40.0000 mg | ORAL_TABLET | Freq: Every day | ORAL | 5 refills | Status: DC

## 2017-09-03 MED ORDER — VITAMIN D 1000 UNITS PO TABS: 2000.0000 [IU] | ORAL_TABLET | Freq: Every day | ORAL | 4 refills | Status: DC

## 2017-09-03 MED ORDER — OMEGA-3-ACID ETHYL ESTERS 1 G PO CAPS: 2.0000 | ORAL_CAPSULE | Freq: Two times a day (BID) | ORAL | 3 refills | Status: DC

## 2017-09-03 MED ORDER — BUSPIRONE HCL 10 MG PO TABS: 10.0000 mg | ORAL_TABLET | Freq: Two times a day (BID) | ORAL | 5 refills | Status: DC

## 2017-09-03 MED ORDER — BUDESONIDE-FORMOTEROL FUMARATE 160-4.5 MCG/ACT IN AERO: 2.0000 | INHALATION_SPRAY | Freq: Two times a day (BID) | RESPIRATORY_TRACT | 5 refills | Status: DC

## 2017-09-03 NOTE — Progress Notes (Signed)
Subjective:    Patient ID: Bridget Johnston, female    DOB: 1960-05-11, 57 y.o.   MRN: [id number]  HPI Pt is a 57 yo female with HTN, asthma, hx of encephalitis who presents to the clinic for 6 month follow up.   She is doing great today. She has no problems. Her mood is well controlled. She is not checking BP's but she denies any CP, palpitations, headaches or vision changes.   She has lost 30lbs with belviq and lifestyle changes. She is doing great. She does want to include more exercise in the next few weeks.   Asthma doing great. No SOB/wheezing. Taking symbicort daily.   She does want to know her measels status. As a child she was not vaccinated due to concern of allergic reaction to vaccine. She doe not know if she ever had or not.   .. Active Ambulatory Problems    Diagnosis Date Noted  . History of pyelonephritis 10/08/2010  . Opiate addiction (HCC) 01/03/2014  . Asthma 01/03/2014  . Hyperlipidemia 07/23/2014  . Hyperglycemia 07/23/2014  . Vitamin D deficiency 07/23/2014  . Elevated liver enzymes 07/23/2014  . Anxiety and depression 07/23/2014  . Fibromyalgia 07/23/2014  . Echocardiogram shows left ventricular diastolic dysfunction 06/09/2016  . Hypotension due to drugs 06/19/2016  . Aphasia 06/21/2016  . History of ischemic stroke in prior three months 06/21/2016  . Essential hypertension 06/21/2016  . 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 Hyponatremia   . Morbid obesity (HCC)   . Encephalitis   . History of pneumonia 09/10/2016  . History of acute respiratory failure 09/10/2016  . Peripheral edema 09/10/2016  . Awareness alteration, transient 10/12/2016  . BMI 39.0-39.9,adult 11/03/2016  . Pre-diabetes 01/01/2017  . History of aphasia 01/01/2017  . Class 2 severe obesity due to excess calories with serious comorbidity and body mass index (BMI) of 39.0 to 39.9 in adult (HCC) 01/01/2017  . Fatigue 01/01/2017   Resolved Ambulatory Problems    Diagnosis Date  Noted  . UTI 10/08/2010  . Acute respiratory failure with hypoxia (HCC) 06/19/2016  . Diabetes mellitus type 2 in obese (HCC)   . Diabetes mellitus without complication (HCC) 09/03/2017   Past Medical History:  Diagnosis Date  . Arthritis   . Asthma   . Back pain   . Diabetes mellitus without complication (HCC)   . Finger pain   . Hand pain   . High triglycerides   . Leg pain       Review of Systems  All other systems reviewed and are negative.      Objective:   Physical Exam  Constitutional: She is oriented to person, place, and time. She appears well-developed and well-nourished.  HENT:  Head: Normocephalic and atraumatic.  Cardiovascular: Normal rate and regular rhythm.  Pulmonary/Chest: Effort normal and breath sounds normal.  Neurological: She is alert and oriented to person, place, and time.  Psychiatric: She has a normal mood and affect. Her behavior is normal.          Assessment & Plan:  [name] KitchenMarland KitchenRanay was seen today for hypertension.  Diagnoses and all orders for this visit:  Mild intermittent asthma without complication -     albuterol (PROAIR HFA) 108 (90 Base) MCG/ACT inhaler; INHALE 2 PUFFS BY MOUTH EVERY 4 HOURS IF NEEDED FOR WHEEZING OR SHORTNESS OF BREATH -     budesonide-formoterol (SYMBICORT) 160-4.5 MCG/ACT inhaler; Inhale 2 puffs into the lungs 2 (two) times daily.  Aphasia -  zonisamide (ZONEGRAN) 100 MG capsule; Take 2 capsules (200 mg total) by mouth at bedtime. -     atorvastatin (LIPITOR) 40 MG tablet; Take 1 tablet (40 mg total) by mouth daily.  Awareness alteration, transient -     zonisamide (ZONEGRAN) 100 MG capsule; Take 2 capsules (200 mg total) by mouth at bedtime.  Anxiety and depression -     Vilazodone HCl (VIIBRYD) 40 MG TABS; Take 1 tablet (40 mg total) by mouth daily. -     busPIRone (BUSPAR) 10 MG tablet; Take 1 tablet (10 mg total) by mouth 2 (two) times daily.  Class 2 severe obesity due to excess calories with serious  comorbidity and body mass index (BMI) of 39.0 to 39.9 in adult (HCC) -     Lorcaserin HCl ER (BELVIQ XR) 20 MG TB24; Take 1 tablet by mouth daily.  Acute non-recurrent maxillary sinusitis -     ipratropium (ATROVENT) 0.06 % nasal spray; USE 1 SPRAY IN EACH NOSTRIL FOUR TIMES DAILY AS NEEDED  Word finding difficulty -     atorvastatin (LIPITOR) 40 MG tablet; Take 1 tablet (40 mg total) by mouth daily.  Pure hypercholesterolemia -     atorvastatin (LIPITOR) 40 MG tablet; Take 1 tablet (40 mg total) by mouth daily.  Pre-diabetes -     POCT HgB A1C  Immunity status testing -     Measles/Mumps/Rubella Immunity -     Varicella zoster antibody, IgG -     Varicella zoster antibody, IgG  Need for hepatitis C screening test -     Hepatitis C Antibody  Essential hypertension  Bilateral leg edema -     furosemide (LASIX) 20 MG tablet; TAKE 1 TABLET BY MOUTH DAILY IF NEEDED FOR FLUID RETENTION OR EDEMA  Other orders -     omega-3 acid ethyl esters (LOVAZA) 1 g capsule; Take 2 capsules (2 g total) by mouth 2 (two) times daily. -     gabapentin (NEURONTIN) 300 MG capsule; TAKE 1 CAPSULE(300 MG) BY MOUTH FOUR TIMES DAILY -     cholecalciferol (VITAMIN D) 1000 units tablet; Take 2 tablets (2,000 Units total) by mouth daily.   ... Lab Results  Component Value Date   HGBA1C 5.5 09/03/2017   A!C is great.   .. Depression screen [facility] AHQ 2/9 09/03/2017 11/03/2016 05/29/2016  Decreased Interest 1 0 0  Down, Depressed, Hopeless 0 0 0  PHQ - 2 Score 1 0 0  Altered sleeping 0 - -  Tired, decreased energy 0 - -  Change in appetite 0 - -  Feeling bad or failure about yourself  0 - -  Trouble concentrating 0 - -  Moving slowly or fidgety/restless 0 - -  Suicidal thoughts 0 - -  PHQ-9 Score 1 - -  Difficult doing work/chores Not difficult at all - -   .[name] Kitchen. GAD 7 : Generalized Anxiety Score 09/03/2017 01/01/2017  Nervous, Anxious, on Edge 1 2  Control/stop worrying 1 3  Worry too much - different  things 1 2  Trouble relaxing 1 3  Restless 0 1  Easily annoyed or irritable 1 2  Afraid - awful might happen 0 2  Total GAD 7 Score 5 15  Anxiety Difficulty Not difficult at all Very difficult    6 month refills on medication given.

## 2017-09-05 ENCOUNTER — Encounter: Payer: Self-pay | Admitting: Physician Assistant

## 2017-09-08 LAB — HEPATITIS C ANTIBODY
Hepatitis C Ab: NONREACTIVE
SIGNAL TO CUT-OFF: 0.08 (ref <1.00)

## 2017-09-08 LAB — VARICELLA ZOSTER ANTIBODY, IGG: VARICELLA IGG: 339.4 {index}

## 2017-09-08 LAB — MEASLES/MUMPS/RUBELLA IMMUNITY: MUMPS IGG: 228 [AU]/ml

## 2017-09-08 NOTE — Progress Notes (Signed)
Pt is not immune against MMR. She needs to come in for booster.  She is immune to mumps and chicken pox.  Hep C screening is negative.

## 2017-09-23 ENCOUNTER — Other Ambulatory Visit: Payer: Self-pay | Admitting: Physician Assistant

## 2017-10-01 ENCOUNTER — Other Ambulatory Visit: Payer: Self-pay | Admitting: Physician Assistant

## 2017-11-22 ENCOUNTER — Telehealth: Payer: Self-pay | Admitting: Physician Assistant

## 2017-11-22 NOTE — Telephone Encounter (Signed)
Drug is covered by current benefit plan. No further PA activity needed. -hsm

## 2017-11-24 ENCOUNTER — Encounter: Payer: Self-pay | Admitting: Physician Assistant

## 2017-11-24 ENCOUNTER — Ambulatory Visit (INDEPENDENT_AMBULATORY_CARE_PROVIDER_SITE_OTHER): Payer: 59 | Admitting: Physician Assistant

## 2017-11-24 VITALS — BP 117/68 | HR 72 | Temp 98.1°F | Ht 63.0 in | Wt 197.0 lb

## 2017-11-24 DIAGNOSIS — I10 Essential (primary) hypertension: Secondary | ICD-10-CM | POA: Diagnosis not present

## 2017-11-24 DIAGNOSIS — Z23 Encounter for immunization: Secondary | ICD-10-CM

## 2017-11-24 DIAGNOSIS — E782 Mixed hyperlipidemia: Secondary | ICD-10-CM

## 2017-11-24 DIAGNOSIS — Z131 Encounter for screening for diabetes mellitus: Secondary | ICD-10-CM | POA: Diagnosis not present

## 2017-11-24 DIAGNOSIS — Z0000 Encounter for general adult medical examination without abnormal findings: Secondary | ICD-10-CM

## 2017-11-24 NOTE — Patient Instructions (Signed)
Health Maintenance for Postmenopausal Women Menopause is a normal process in which your reproductive ability comes to an end. This process happens gradually over a span of months to years, usually between the ages of 22 and 9. Menopause is complete when you have missed 12 consecutive menstrual periods. It is important to talk with your health care provider about some of the most common conditions that affect postmenopausal women, such as heart disease, cancer, and bone loss (osteoporosis). Adopting a healthy lifestyle and getting preventive care can help to promote your health and wellness. Those actions can also lower your chances of developing some of these common conditions. What should I know about menopause? During menopause, you may experience a number of symptoms, such as:  Moderate-to-severe hot flashes.  Night sweats.  Decrease in sex drive.  Mood swings.  Headaches.  Tiredness.  Irritability.  Memory problems.  Insomnia.  Choosing to treat or not to treat menopausal changes is an individual decision that you make with your health care provider. What should I know about hormone replacement therapy and supplements? Hormone therapy products are effective for treating symptoms that are associated with menopause, such as hot flashes and night sweats. Hormone replacement carries certain risks, especially as you become older. If you are thinking about using estrogen or estrogen with progestin treatments, discuss the benefits and risks with your health care provider. What should I know about heart disease and stroke? Heart disease, heart attack, and stroke become more likely as you age. This may be due, in part, to the hormonal changes that your body experiences during menopause. These can affect how your body processes dietary fats, triglycerides, and cholesterol. Heart attack and stroke are both medical emergencies. There are many things that you can do to help prevent heart disease  and stroke:  Have your blood pressure checked at least every 1-2 years. High blood pressure causes heart disease and increases the risk of stroke.  If you are 53-22 years old, ask your health care provider if you should take aspirin to prevent a heart attack or a stroke.  Do not use any tobacco products, including cigarettes, chewing tobacco, or electronic cigarettes. If you need help quitting, ask your health care provider.  It is important to eat a healthy diet and maintain a healthy weight. ? Be sure to include plenty of vegetables, fruits, low-fat dairy products, and lean protein. ? Avoid eating foods that are high in solid fats, added sugars, or salt (sodium).  Get regular exercise. This is one of the most important things that you can do for your health. ? Try to exercise for at least 150 minutes each week. The type of exercise that you do should increase your heart rate and make you sweat. This is known as moderate-intensity exercise. ? Try to do strengthening exercises at least twice each week. Do these in addition to the moderate-intensity exercise.  Know your numbers.Ask your health care provider to check your cholesterol and your blood glucose. Continue to have your blood tested as directed by your health care provider.  What should I know about cancer screening? There are several types of cancer. Take the following steps to reduce your risk and to catch any cancer development as early as possible. Breast Cancer  Practice breast self-awareness. ? This means understanding how your breasts normally appear and feel. ? It also means doing regular breast self-exams. Let your health care provider know about any changes, no matter how small.  If you are 40  or older, have a clinician do a breast exam (clinical breast exam or CBE) every year. Depending on your age, family history, and medical history, it may be recommended that you also have a yearly breast X-ray (mammogram).  If you  have a family history of breast cancer, talk with your health care provider about genetic screening.  If you are at high risk for breast cancer, talk with your health care provider about having an MRI and a mammogram every year.  Breast cancer (BRCA) gene test is recommended for women who have family members with BRCA-related cancers. Results of the assessment will determine the need for genetic counseling and BRCA1 and for BRCA2 testing. BRCA-related cancers include these types: ? Breast. This occurs in males or females. ? Ovarian. ? Tubal. This may also be called fallopian tube cancer. ? Cancer of the abdominal or pelvic lining (peritoneal cancer). ? Prostate. ? Pancreatic.  Cervical, Uterine, and Ovarian Cancer Your health care provider may recommend that you be screened regularly for cancer of the pelvic organs. These include your ovaries, uterus, and vagina. This screening involves a pelvic exam, which includes checking for microscopic changes to the surface of your cervix (Pap test).  For women ages 21-65, health care providers may recommend a pelvic exam and a Pap test every three years. For women ages 79-65, they may recommend the Pap test and pelvic exam, combined with testing for human papilloma virus (HPV), every five years. Some types of HPV increase your risk of cervical cancer. Testing for HPV may also be done on women of any age who have unclear Pap test results.  Other health care providers may not recommend any screening for nonpregnant women who are considered low risk for pelvic cancer and have no symptoms. Ask your health care provider if a screening pelvic exam is right for you.  If you have had past treatment for cervical cancer or a condition that could lead to cancer, you need Pap tests and screening for cancer for at least 20 years after your treatment. If Pap tests have been discontinued for you, your risk factors (such as having a new sexual partner) need to be  reassessed to determine if you should start having screenings again. Some women have medical problems that increase the chance of getting cervical cancer. In these cases, your health care provider may recommend that you have screening and Pap tests more often.  If you have a family history of uterine cancer or ovarian cancer, talk with your health care provider about genetic screening.  If you have vaginal bleeding after reaching menopause, tell your health care provider.  There are currently no reliable tests available to screen for ovarian cancer.  Lung Cancer Lung cancer screening is recommended for adults 69-62 years old who are at high risk for lung cancer because of a history of smoking. A yearly low-dose CT scan of the lungs is recommended if you:  Currently smoke.  Have a history of at least 30 pack-years of smoking and you currently smoke or have quit within the past 15 years. A pack-year is smoking an average of one pack of cigarettes per day for one year.  Yearly screening should:  Continue until it has been 15 years since you quit.  Stop if you develop a health problem that would prevent you from having lung cancer treatment.  Colorectal Cancer  This type of cancer can be detected and can often be prevented.  Routine colorectal cancer screening usually begins at  age 42 and continues through age 45.  If you have risk factors for colon cancer, your health care provider may recommend that you be screened at an earlier age.  If you have a family history of colorectal cancer, talk with your health care provider about genetic screening.  Your health care provider may also recommend using home test kits to check for hidden blood in your stool.  A small camera at the end of a tube can be used to examine your colon directly (sigmoidoscopy or colonoscopy). This is done to check for the earliest forms of colorectal cancer.  Direct examination of the colon should be repeated every  5-10 years until age 71. However, if early forms of precancerous polyps or small growths are found or if you have a family history or genetic risk for colorectal cancer, you may need to be screened more often.  Skin Cancer  Check your skin from head to toe regularly.  Monitor any moles. Be sure to tell your health care provider: ? About any new moles or changes in moles, especially if there is a change in a mole's shape or color. ? If you have a mole that is larger than the size of a pencil eraser.  If any of your family members has a history of skin cancer, especially at a young age, talk with your health care provider about genetic screening.  Always use sunscreen. Apply sunscreen liberally and repeatedly throughout the day.  Whenever you are outside, protect yourself by wearing long sleeves, pants, a wide-brimmed hat, and sunglasses.  What should I know about osteoporosis? Osteoporosis is a condition in which bone destruction happens more quickly than new bone creation. After menopause, you may be at an increased risk for osteoporosis. To help prevent osteoporosis or the bone fractures that can happen because of osteoporosis, the following is recommended:  If you are 46-71 years old, get at least 1,000 mg of calcium and at least 600 mg of vitamin D per day.  If you are older than age 55 but younger than age 65, get at least 1,200 mg of calcium and at least 600 mg of vitamin D per day.  If you are older than age 54, get at least 1,200 mg of calcium and at least 800 mg of vitamin D per day.  Smoking and excessive alcohol intake increase the risk of osteoporosis. Eat foods that are rich in calcium and vitamin D, and do weight-bearing exercises several times each week as directed by your health care provider. What should I know about how menopause affects my mental health? Depression may occur at any age, but it is more common as you become older. Common symptoms of depression  include:  Low or sad mood.  Changes in sleep patterns.  Changes in appetite or eating patterns.  Feeling an overall lack of motivation or enjoyment of activities that you previously enjoyed.  Frequent crying spells.  Talk with your health care provider if you think that you are experiencing depression. What should I know about immunizations? It is important that you get and maintain your immunizations. These include:  Tetanus, diphtheria, and pertussis (Tdap) booster vaccine.  Influenza every year before the flu season begins.  Pneumonia vaccine.  Shingles vaccine.  Your health care provider may also recommend other immunizations. This information is not intended to replace advice given to you by your health care provider. Make sure you discuss any questions you have with your health care provider. Document Released: 04/17/2005  Document Revised: 09/13/2015 Document Reviewed: 11/27/2014 Elsevier Interactive Patient Education  2018 [employer]  

## 2017-11-24 NOTE — Progress Notes (Signed)
Subjective:     Bridget Johnston is a 57 y.o. female and is here for a comprehensive physical exam. The patient reports problems - she has notice a little bit of intermittent ST for last 2-3 days. no fever, chills, body aches, cough, sOB, wheezing, headache or sinus pressure. not tried anything to make better. .  Social History   Socioeconomic History  . Marital status: Married    Spouse name: Not on file  . Number of children: Not on file  . Years of education: college  . Highest education level: Not on file  Occupational History  . Occupation: hairdresser    Associate Professor: OTHER  Social Needs  . Financial resource strain: Not on file  . Food insecurity:    Worry: Not on file    Inability: Not on file  . Transportation needs:    Medical: Not on file    Non-medical: Not on file  Tobacco Use  . Smoking status: Former Smoker    Packs/day: 1.00    Years: 17.00    Pack years: 17.00    Types: Cigarettes, E-cigarettes    Last attempt to quit: 01/06/2012    Years since quitting: 5.8  . Smokeless tobacco: Never Used  Substance and Sexual Activity  . Alcohol use: No    Alcohol/week: 0.0 standard drinks  . Drug use: No    Comment: opiod adduction  . Sexual activity: Yes    Partners: Male    Birth control/protection: Surgical    Comment: BTL  Lifestyle  . Physical activity:    Days per week: Not on file    Minutes per session: Not on file  . Stress: Not on file  Relationships  . Social connections:    Talks on phone: Not on file    Gets together: Not on file    Attends religious service: Not on file    Active member of club or organization: Not on file    Attends meetings of clubs or organizations: Not on file    Relationship status: Not on file  . Intimate partner violence:    Fear of current or ex partner: Not on file    Emotionally abused: Not on file    Physically abused: Not on file    Forced sexual activity: Not on file  Other Topics Concern  . Not on file  Social  History Narrative  . Not on file   Health Maintenance  Topic Date Due  . PAP SMEAR  01/03/2017  . INFLUENZA VACCINE  10/07/2017  . URINE MICROALBUMIN  11/02/2017  . MAMMOGRAM  12/05/2018  . TETANUS/TDAP  10/29/2024  . COLONOSCOPY  04/06/2026  . Hepatitis C Screening  Completed  . HIV Screening  Completed    The following portions of the patient's history were reviewed and updated as appropriate: allergies, current medications, past family history, past medical history, past social history, past surgical history and problem list.  Review of Systems A comprehensive review of systems was negative.   Objective:    BP 117/68   Pulse 72   Temp 98.1 F (36.7 C) (Oral)   Ht 5\' 3"  (1.6 m)   Wt 197 lb (89.4 kg)   BMI 34.90 kg/m  General appearance: alert, cooperative and appears stated age Head: Normocephalic, without obvious abnormality, atraumatic Eyes: conjunctivae/corneas clear. PERRL, EOM's intact. Fundi benign. Ears: normal TM's and external ear canals both ears Nose: Nares normal. Septum midline. Mucosa normal. No drainage or sinus tenderness. Throat: lips, mucosa,  and tongue normal; teeth and gums normal some scant PND Neck: no adenopathy, no carotid bruit, no JVD, supple, symmetrical, trachea midline and thyroid not enlarged, symmetric, no tenderness/mass/nodules Back: symmetric, no curvature. ROM normal. No CVA tenderness. Lungs: clear to auscultation bilaterally Heart: regular rate and rhythm, S1, S2 normal, no murmur, click, rub or gallop Abdomen: soft, non-tender; bowel sounds normal; no masses,  no organomegaly Extremities: extremities normal, atraumatic, no cyanosis or edema Pulses: 2+ and symmetric Skin: Skin color, texture, turgor normal. No rashes or lesions Lymph nodes: Cervical, supraclavicular, and axillary nodes normal. Neurologic: Alert and oriented X 3, normal strength and tone. Normal symmetric reflexes. Normal coordination and gait    Assessment:     Healthy female exam.      Plan:    [name] Kitchen.[name] Kitchen.Bridget Johnston was seen today for annual exam.  Diagnoses and all orders for this visit:  Routine physical examination -     Lipid Panel w/reflex Direct LDL -     Hemoglobin A1c -     COMPLETE METABOLIC PANEL WITH GFR -     CBC with Differential/Platelet -     Ferritin  Need for immunization against influenza -     Flu Vaccine QUAD 36+ mos IM  Essential hypertension  Screening for diabetes mellitus -     Hemoglobin A1c  Mixed hyperlipidemia -     Lipid Panel w/reflex Direct LDL   .[name]. [facility]/24/2017 09/03/2017 11/03/2016 05/29/2016  Decreased Interest 0 1 0 0  Down, Depressed, Hopeless 0 0 0 0  PHQ - 2 Score 0 1 0 0  Altered sleeping 0 0 - -  Tired, decreased energy 0 0 - -  Change in appetite 0 0 - -  Feeling bad or failure about yourself  0 0 - -  Trouble concentrating 0 0 - -  Moving slowly or fidgety/restless 0 0 - -  Suicidal thoughts 0 0 - -  PHQ-9 Score 0 1 - -  Difficult doing work/chores - Not difficult at all - -   .[name] Kitchen. Discussed 150 minutes of exercise a week.  Encouraged vitamin D 1000 units and Calcium 1300mg  or 4 servings of dairy a day.  Fasting labs ordered today.  Mammogram up to date and opts for every 2 years. Will do breast exam with pap smear at another appt.  Pt declines pap today and will do with another appt.  Flu shot given.  shingrix discussed and will get at another time.   Doing well. Continue to target weight loss.   Pt is having some sore throat symptoms. Reassuring it appeared viral today. Given HO on symptomatic care. Call office is symptoms progressing and lasted long than 10 days.   See After Visit Summary for Counseling Recommendations

## 2017-11-25 LAB — LIPID PANEL W/REFLEX DIRECT LDL
Cholesterol: 127 mg/dL (ref <200)
HDL: 63 mg/dL (ref >50)
LDL Cholesterol (Calc): 48 mg/dL (calc)
NON-HDL CHOLESTEROL (CALC): 64 mg/dL (ref <130)
Total CHOL/HDL Ratio: 2 (calc) (ref <5.0)
Triglycerides: 79 mg/dL (ref <150)

## 2017-11-25 LAB — CBC WITH DIFFERENTIAL/PLATELET
BASOS ABS: 50 {cells}/uL (ref 0–200)
BASOS PCT: 0.9 %
EOS ABS: 218 {cells}/uL (ref 15–500)
Eosinophils Relative: 3.9 %
HCT: 38.5 % (ref 35.0–45.0)
HEMOGLOBIN: 13.2 g/dL (ref 11.7–15.5)
Lymphs Abs: 1842 cells/uL (ref 850–3900)
MCH: 32.4 pg (ref 27.0–33.0)
MCHC: 34.3 g/dL (ref 32.0–36.0)
MCV: 94.6 fL (ref 80.0–100.0)
MPV: 10.5 fL (ref 7.5–12.5)
Monocytes Relative: 8.6 %
NEUTROS ABS: 3007 {cells}/uL (ref 1500–7800)
Neutrophils Relative %: 53.7 %
Platelets: 277 10*3/uL (ref 140–400)
RBC: 4.07 10*6/uL (ref 3.80–5.10)
RDW: 11.7 % (ref 11.0–15.0)
Total Lymphocyte: 32.9 %
WBC: 5.6 10*3/uL (ref 3.8–10.8)
WBCMIX: 482 {cells}/uL (ref 200–950)

## 2017-11-25 LAB — COMPLETE METABOLIC PANEL WITH GFR
AG RATIO: 2.3 (calc) (ref 1.0–2.5)
ALT: 22 U/L (ref 6–29)
AST: 19 U/L (ref 10–35)
Albumin: 4.9 g/dL (ref 3.6–5.1)
Alkaline phosphatase (APISO): 59 U/L (ref 33–130)
BUN: 17 mg/dL (ref 7–25)
CALCIUM: 10.3 mg/dL (ref 8.6–10.4)
CO2: 26 mmol/L (ref 20–32)
CREATININE: 0.79 mg/dL (ref 0.50–1.05)
Chloride: 107 mmol/L (ref 98–110)
GFR, EST NON AFRICAN AMERICAN: 83 mL/min/{1.73_m2} (ref >=60)
GFR, Est African American: 96 mL/min/{1.73_m2} (ref >=60)
Globulin: 2.1 g/dL (calc) (ref 1.9–3.7)
Glucose, Bld: 101 mg/dL — ABNORMAL HIGH (ref 65–99)
POTASSIUM: 5.1 mmol/L (ref 3.5–5.3)
Sodium: 142 mmol/L (ref 135–146)
Total Bilirubin: 0.9 mg/dL (ref 0.2–1.2)
Total Protein: 7 g/dL (ref 6.1–8.1)

## 2017-11-25 LAB — FERRITIN: Ferritin: 39 ng/mL (ref 16–232)

## 2017-11-25 LAB — HEMOGLOBIN A1C
EAG (MMOL/L): 5.7 (calc)
Hgb A1c MFr Bld: 5.2 % of total Hgb (ref <5.7)
Mean Plasma Glucose: 103 (calc)

## 2017-11-25 NOTE — Progress Notes (Signed)
Call pt: cholesterol looks great. Blood sugar great. Wonderful labs.

## 2017-12-10 ENCOUNTER — Other Ambulatory Visit: Payer: Self-pay | Admitting: Physician Assistant

## 2017-12-10 ENCOUNTER — Ambulatory Visit: Payer: 59 | Admitting: Physician Assistant

## 2017-12-10 DIAGNOSIS — J4531 Mild persistent asthma with (acute) exacerbation: Secondary | ICD-10-CM

## 2017-12-10 DIAGNOSIS — F419 Anxiety disorder, unspecified: Secondary | ICD-10-CM

## 2017-12-10 DIAGNOSIS — F329 Major depressive disorder, single episode, unspecified: Secondary | ICD-10-CM

## 2017-12-10 NOTE — Telephone Encounter (Signed)
Dr. Lyn Hollingshead covering for [facility].  I authorized a refill request for the Decadron, from my understanding based on chart review, patient takes this for intermittent asthma flares.  Can we just call and let her know that if she is not feeling better with this, or has any concerns for severe respiratory problems, will need medical evaluation in the office or ER/urgent care if needed.

## 2017-12-10 NOTE — Telephone Encounter (Signed)
Spoke with Pt, she did not need the refill on Decadron. She only uses this as needed, and the pharmacy must have requested it without talking to her. She will call them and tell them to keep it on file for when needed.

## 2017-12-27 ENCOUNTER — Other Ambulatory Visit: Payer: Self-pay | Admitting: Physician Assistant

## 2017-12-28 ENCOUNTER — Other Ambulatory Visit: Payer: Self-pay

## 2017-12-28 ENCOUNTER — Other Ambulatory Visit: Payer: Self-pay | Admitting: Physician Assistant

## 2017-12-28 DIAGNOSIS — R4701 Aphasia: Secondary | ICD-10-CM

## 2017-12-28 DIAGNOSIS — E7800 Pure hypercholesterolemia, unspecified: Secondary | ICD-10-CM

## 2017-12-28 DIAGNOSIS — R4789 Other speech disturbances: Secondary | ICD-10-CM

## 2017-12-28 DIAGNOSIS — J0100 Acute maxillary sinusitis, unspecified: Secondary | ICD-10-CM

## 2017-12-28 MED ORDER — ATORVASTATIN CALCIUM 40 MG PO TABS: 40.0000 mg | ORAL_TABLET | Freq: Every day | ORAL | 5 refills | Status: DC

## 2017-12-28 MED ORDER — OMEGA-3-ACID ETHYL ESTERS 1 G PO CAPS: 2.0000 | ORAL_CAPSULE | Freq: Two times a day (BID) | ORAL | 3 refills | Status: DC

## 2018-01-08 ENCOUNTER — Other Ambulatory Visit: Payer: Self-pay | Admitting: Physician Assistant

## 2018-01-08 DIAGNOSIS — J4520 Mild intermittent asthma, uncomplicated: Secondary | ICD-10-CM

## 2018-01-19 ENCOUNTER — Other Ambulatory Visit: Payer: Self-pay | Admitting: Physician Assistant

## 2018-01-19 DIAGNOSIS — J0100 Acute maxillary sinusitis, unspecified: Secondary | ICD-10-CM

## 2018-01-25 ENCOUNTER — Other Ambulatory Visit: Payer: Self-pay | Admitting: Physician Assistant

## 2018-02-08 ENCOUNTER — Other Ambulatory Visit: Payer: Self-pay | Admitting: Physician Assistant

## 2018-02-08 DIAGNOSIS — J0100 Acute maxillary sinusitis, unspecified: Secondary | ICD-10-CM

## 2018-02-17 ENCOUNTER — Other Ambulatory Visit: Payer: Self-pay | Admitting: Physician Assistant

## 2018-02-17 ENCOUNTER — Encounter: Payer: Self-pay | Admitting: Physician Assistant

## 2018-02-17 MED ORDER — GABAPENTIN 300 MG PO CAPS: ORAL_CAPSULE | ORAL | 1 refills | Status: DC

## 2018-02-18 MED ORDER — BUDESONIDE-FORMOTEROL FUMARATE 160-4.5 MCG/ACT IN AERO: 2.0000 | INHALATION_SPRAY | Freq: Two times a day (BID) | RESPIRATORY_TRACT | 0 refills | Status: DC

## 2018-03-11 ENCOUNTER — Encounter: Payer: Self-pay | Admitting: Physician Assistant

## 2018-03-11 ENCOUNTER — Telehealth: Payer: Self-pay | Admitting: Physician Assistant

## 2018-03-11 ENCOUNTER — Ambulatory Visit (INDEPENDENT_AMBULATORY_CARE_PROVIDER_SITE_OTHER): Payer: 59 | Admitting: Physician Assistant

## 2018-03-11 VITALS — BP 139/69 | HR 73 | Temp 98.7°F | Ht 63.0 in | Wt 192.0 lb

## 2018-03-11 DIAGNOSIS — F329 Major depressive disorder, single episode, unspecified: Secondary | ICD-10-CM

## 2018-03-11 DIAGNOSIS — J209 Acute bronchitis, unspecified: Secondary | ICD-10-CM | POA: Diagnosis not present

## 2018-03-11 DIAGNOSIS — R600 Localized edema: Secondary | ICD-10-CM

## 2018-03-11 DIAGNOSIS — R4789 Other speech disturbances: Secondary | ICD-10-CM

## 2018-03-11 DIAGNOSIS — Z6839 Body mass index (BMI) 39.0-39.9, adult: Secondary | ICD-10-CM

## 2018-03-11 DIAGNOSIS — R4701 Aphasia: Secondary | ICD-10-CM

## 2018-03-11 DIAGNOSIS — F419 Anxiety disorder, unspecified: Secondary | ICD-10-CM | POA: Diagnosis not present

## 2018-03-11 DIAGNOSIS — J4531 Mild persistent asthma with (acute) exacerbation: Secondary | ICD-10-CM | POA: Diagnosis not present

## 2018-03-11 DIAGNOSIS — E7800 Pure hypercholesterolemia, unspecified: Secondary | ICD-10-CM

## 2018-03-11 MED ORDER — FUROSEMIDE 20 MG PO TABS: ORAL_TABLET | ORAL | 5 refills | Status: DC

## 2018-03-11 MED ORDER — BUDESONIDE-FORMOTEROL FUMARATE 160-4.5 MCG/ACT IN AERO: 2.0000 | INHALATION_SPRAY | Freq: Two times a day (BID) | RESPIRATORY_TRACT | 5 refills | Status: DC

## 2018-03-11 MED ORDER — METHYLPREDNISOLONE SODIUM SUCC 125 MG IJ SOLR
125.0000 mg | Freq: Once | INTRAMUSCULAR | Status: AC
  Administered 2018-03-11: 125 mg via INTRAMUSCULAR

## 2018-03-11 MED ORDER — BUSPIRONE HCL 10 MG PO TABS: 10.0000 mg | ORAL_TABLET | Freq: Two times a day (BID) | ORAL | 5 refills | Status: DC

## 2018-03-11 MED ORDER — AZITHROMYCIN 250 MG PO TABS: ORAL_TABLET | ORAL | 0 refills | Status: DC

## 2018-03-11 MED ORDER — VILAZODONE HCL 40 MG PO TABS: 40.0000 mg | ORAL_TABLET | Freq: Every day | ORAL | 5 refills | Status: DC

## 2018-03-11 MED ORDER — FLUCONAZOLE 150 MG PO TABS: 150.0000 mg | ORAL_TABLET | Freq: Once | ORAL | 0 refills | Status: AC

## 2018-03-11 MED ORDER — ATORVASTATIN CALCIUM 40 MG PO TABS: 40.0000 mg | ORAL_TABLET | Freq: Every day | ORAL | 5 refills | Status: DC

## 2018-03-11 MED ORDER — LORCASERIN HCL ER 20 MG PO TB24: 1.0000 | ORAL_TABLET | Freq: Every day | ORAL | 5 refills | Status: DC

## 2018-03-11 NOTE — Progress Notes (Signed)
lumso

## 2018-03-11 NOTE — Progress Notes (Signed)
Subjective:    Patient ID: Bridget Johnston, female    DOB: 07/13/1960, 58 y.o.   MRN: [id number]  HPI Pt is a 58 yo obese female with HTN, asthma, HLD, pre diabetes, fibromyalgia, anxiety who presents to the clinic for follow up.   Overall patient is doing well. No CP, palpitations, headaches or vision changes.   Pt's mood is doing great on viibryd and buspar. No SI/HC.   Pt continues to lose weight on belviq and feels great.   Pt has asthma and on symbicort. Hx of pneumonia. Started coughing and feeling SOB about one week ok. Albuterol inhaler helping some. Taking OtC mucinex and delsym with little relief. No fever, chills,   .. Active Ambulatory Problems    Diagnosis Date Noted  . History of pyelonephritis 10/08/2010  . Opiate addiction (HCC) 01/03/2014  . Asthma 01/03/2014  . Hyperlipidemia 07/23/2014  . Hyperglycemia 07/23/2014  . Vitamin D deficiency 07/23/2014  . Elevated liver enzymes 07/23/2014  . Anxiety and depression 07/23/2014  . Fibromyalgia 07/23/2014  . Echocardiogram shows left ventricular diastolic dysfunction 06/09/2016  . Hypotension due to drugs 06/19/2016  . Aphasia 06/21/2016  . History of ischemic stroke in prior three months 06/21/2016  . Essential hypertension 06/21/2016  . 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 Hyponatremia   . Encephalitis   . History of pneumonia 09/10/2016  . History of acute respiratory failure 09/10/2016  . Peripheral edema 09/10/2016  . Awareness alteration, transient 10/12/2016  . BMI 39.0-39.9,adult 11/03/2016  . Pre-diabetes 01/01/2017  . History of aphasia 01/01/2017  . Class 2 severe obesity due to excess calories with serious comorbidity and body mass index (BMI) of 39.0 to 39.9 in adult (HCC) 01/01/2017  . Fatigue 01/01/2017   Resolved Ambulatory Problems    Diagnosis Date Noted  . UTI 10/08/2010  . Acute respiratory failure with hypoxia (HCC) 06/19/2016  . Diabetes mellitus type 2 in obese (HCC)   .  Morbid obesity (HCC)   . Diabetes mellitus without complication (HCC) 09/03/2017   Past Medical History:  Diagnosis Date  . Arthritis   . Back pain   . Finger pain   . Hand pain   . High triglycerides   . Leg pain     Review of Systems See HPI>     Objective:   Physical Exam Vitals signs reviewed.  Constitutional:      Appearance: Normal appearance.  HENT:     Head: Normocephalic and atraumatic.     Right Ear: Tympanic membrane and ear canal normal.     Left Ear: Tympanic membrane and ear canal normal.     Nose: Nose normal.     Mouth/Throat:     Mouth: Mucous membranes are moist.  Eyes:     Extraocular Movements: Extraocular movements intact.     Pupils: Pupils are equal, round, and reactive to light.  Cardiovascular:     Rate and Rhythm: Normal rate and regular rhythm.     Pulses: Normal pulses.     Heart sounds: Normal heart sounds.  Pulmonary:     Effort: Pulmonary effort is normal.     Breath sounds: Wheezing and rhonchi present.  Neurological:     General: No focal deficit present.     Mental Status: She is alert and oriented to person, place, and time.  Psychiatric:        Mood and Affect: Mood normal.        Behavior: Behavior normal.  Assessment & Plan:  [name] KitchenMarland KitchenTerre was seen today for follow-up.  Diagnoses and all orders for this visit:  Acute bronchitis, unspecified organism -     azithromycin (ZITHROMAX) 250 MG tablet; 2 tablets now and then one tablet for 4 days. -     fluconazole (DIFLUCAN) 150 MG tablet; Take 1 tablet (150 mg total) by mouth once for 1 dose. -     methylPREDNISolone sodium succinate (SOLU-MEDROL) 125 mg/2 mL injection 125 mg  Anxiety and depression -     Vilazodone HCl (VIIBRYD) 40 MG TABS; Take 1 tablet (40 mg total) by mouth daily. -     busPIRone (BUSPAR) 10 MG tablet; Take 1 tablet (10 mg total) by mouth 2 (two) times daily.  Class 2 severe obesity due to excess calories with serious comorbidity and body mass  index (BMI) of 39.0 to 39.9 in adult (HCC) -     Lorcaserin HCl ER (BELVIQ XR) 20 MG TB24; Take 1 tablet by mouth daily.  Bilateral leg edema -     furosemide (LASIX) 20 MG tablet; TAKE 1 TABLET BY MOUTH DAILY IF NEEDED FOR FLUID RETENTION OR EDEMA  Word finding difficulty -     atorvastatin (LIPITOR) 40 MG tablet; Take 1 tablet (40 mg total) by mouth daily.  Aphasia -     atorvastatin (LIPITOR) 40 MG tablet; Take 1 tablet (40 mg total) by mouth daily.  Pure hypercholesterolemia -     atorvastatin (LIPITOR) 40 MG tablet; Take 1 tablet (40 mg total) by mouth daily.  Mild persistent asthma with acute exacerbation -     budesonide-formoterol (SYMBICORT) 160-4.5 MCG/ACT inhaler; Inhale 2 puffs into the lungs 2 (two) times daily. -     methylPREDNISolone sodium succinate (SOLU-MEDROL) 125 mg/2 mL injection 125 mg  .. Depression screen [facility] 2/9 11/24/2017 09/03/2017 11/03/2016 05/29/2016  Decreased Interest 0 1 0 0  Down, Depressed, Hopeless 0 0 0 0  PHQ - 2 Score 0 1 0 0  Altered sleeping 0 0 - -  Tired, decreased energy 0 0 - -  Change in appetite 0 0 - -  Feeling bad or failure about yourself  0 0 - -  Trouble concentrating 0 0 - -  Moving slowly or fidgety/restless 0 0 - -  Suicidal thoughts 0 0 - -  PHQ-9 Score 0 1 - -  Difficult doing work/chores - Not difficult at all - -    Weight down from 197 to 192. Continue belviq.   Refilled medications. Labs up to date.   Treated with solumedrol today in office. Does not tolerate prednisone. Weekend hold zpak but if continues or worsens can start. Use albuterol. Continue symbicort and mucinex. Diflucan sent if have to use zpak.   Follow up in 6 months.

## 2018-03-11 NOTE — Telephone Encounter (Signed)
This medication may be excluded from the patient's benefit. For more information, please reach out to [facility] directly at [phone]. Message from [facility]: Drug is not covered by plan. Patient is aware and she will have to pay out of pocket for this medicine.

## 2018-03-14 ENCOUNTER — Encounter: Payer: Self-pay | Admitting: Physician Assistant

## 2018-03-20 ENCOUNTER — Other Ambulatory Visit: Payer: Self-pay | Admitting: Physician Assistant

## 2018-03-20 DIAGNOSIS — F329 Major depressive disorder, single episode, unspecified: Secondary | ICD-10-CM

## 2018-03-20 DIAGNOSIS — F419 Anxiety disorder, unspecified: Secondary | ICD-10-CM

## 2018-03-21 ENCOUNTER — Encounter: Payer: Self-pay | Admitting: Physician Assistant

## 2018-04-14 ENCOUNTER — Other Ambulatory Visit: Payer: Self-pay | Admitting: Physician Assistant

## 2018-04-22 ENCOUNTER — Encounter: Payer: Self-pay | Admitting: Physician Assistant

## 2018-04-22 ENCOUNTER — Other Ambulatory Visit: Payer: Self-pay | Admitting: Physician Assistant

## 2018-04-22 MED ORDER — BENZONATATE 200 MG PO CAPS: 200.0000 mg | ORAL_CAPSULE | Freq: Two times a day (BID) | ORAL | 0 refills | Status: DC | PRN

## 2018-04-22 MED ORDER — METHYLPREDNISOLONE 4 MG PO TBPK: ORAL_TABLET | ORAL | 0 refills | Status: DC

## 2018-05-04 ENCOUNTER — Encounter: Payer: Self-pay | Admitting: Physician Assistant

## 2018-05-13 ENCOUNTER — Other Ambulatory Visit: Payer: Self-pay | Admitting: Physician Assistant

## 2018-05-13 ENCOUNTER — Encounter: Payer: Self-pay | Admitting: Physician Assistant

## 2018-05-16 MED ORDER — PHENTERMINE-TOPIRAMATE ER 7.5-46 MG PO CP24: 1.0000 | ORAL_CAPSULE | Freq: Every morning | ORAL | 0 refills | Status: DC

## 2018-05-28 IMAGING — MR MR HEAD W/O CM
9 of 10 series · 36 of 48 positions shown · non-contrast
Comparison: Head CT 06/21/2016

CLINICAL DATA: Confusion and aphasia

EXAM:
MRI HEAD WITHOUT CONTRAST
TECHNIQUE: Multiplanar, multiecho pulse sequences of the brain and surrounding
structures were obtained without intravenous contrast.

[Series 2: FLAIR · sagittal · 5.0mm · 0.47mm/px · 3 of 25 slices shown (1 of 2)]
[im 1/25]
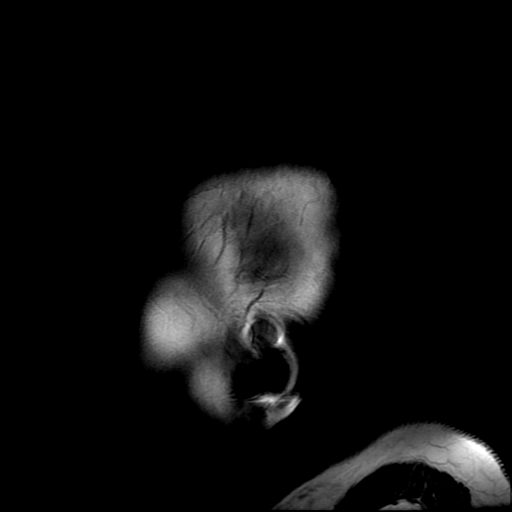
[im 13/25]
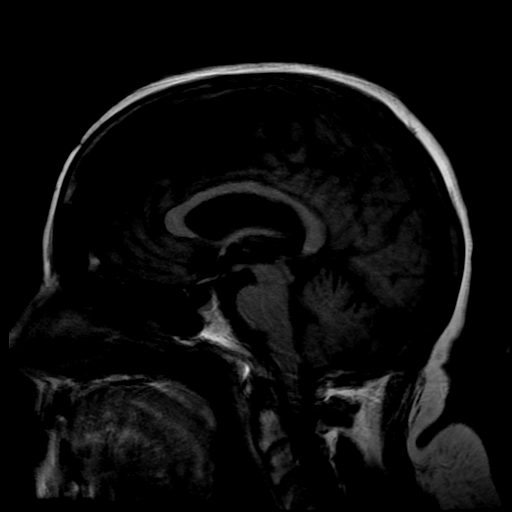
[im 25/25]
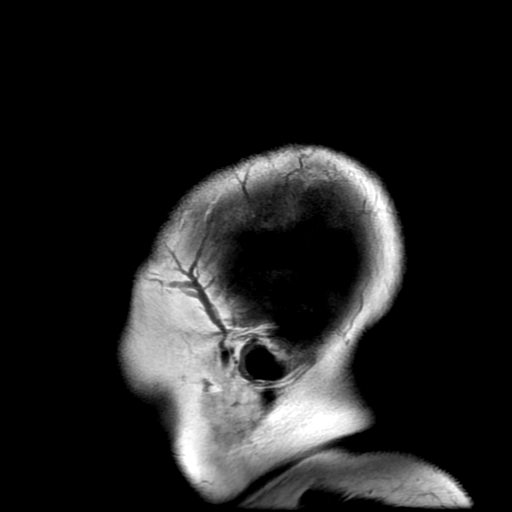

[Series 4: DWI · axial · 3.0mm · 0.94mm/px · z∈[-51,+101]mm · 9 of 104 slices shown (1 of 2)]
[im 1/104]
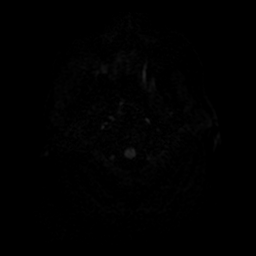
[im 13/104]
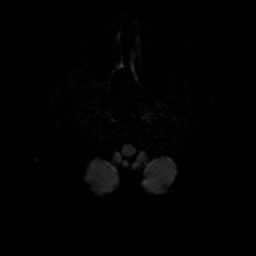
[im 26/104]
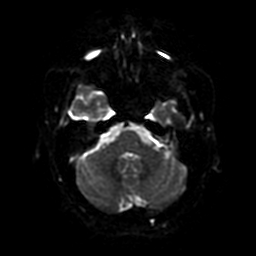
[im 39/104]
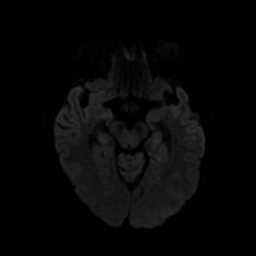
[im 52/104]
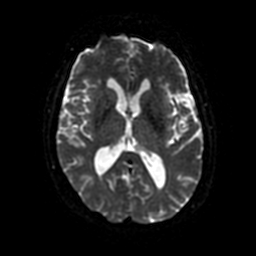
[im 65/104]
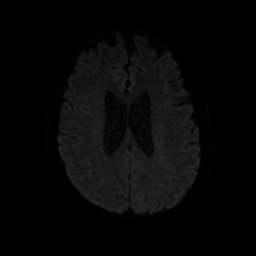
[im 78/104]
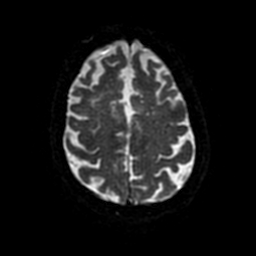
[im 91/104]
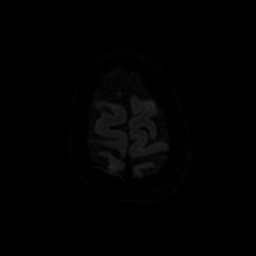
[im 104/104]
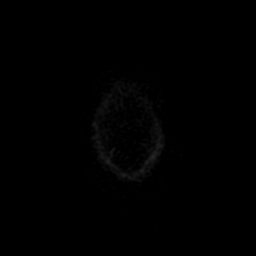

[Series 6: T2 · axial · 5.0mm · 0.47mm/px · z∈[-49,+100]mm · 2 of 26 slices shown (1 of 2)]
[im 1/26]
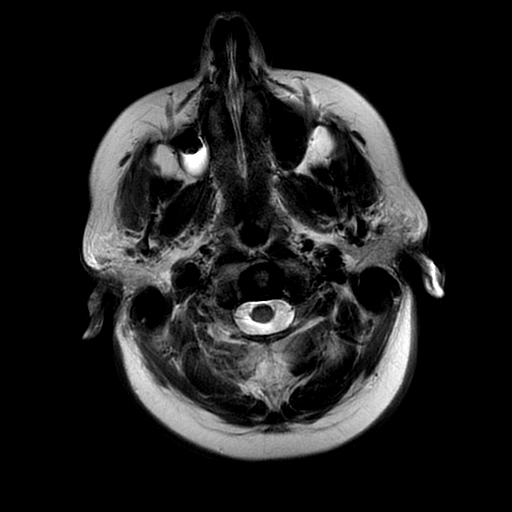
[im 26/26]
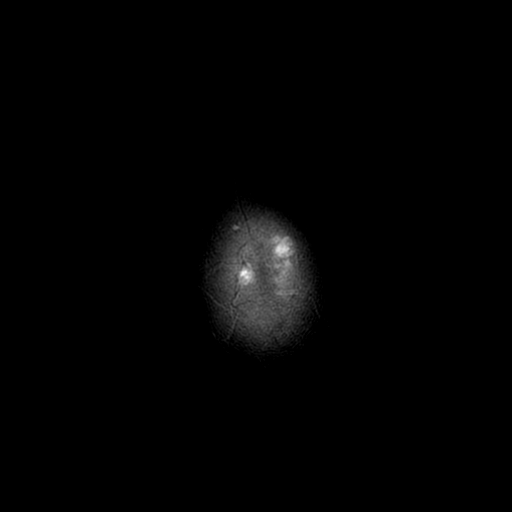

[Series 7: FLAIR · axial · 5.0mm · 0.47mm/px · z∈[-49,+100]mm · 2 of 26 slices shown (2 of 2)]
[im 1/26]
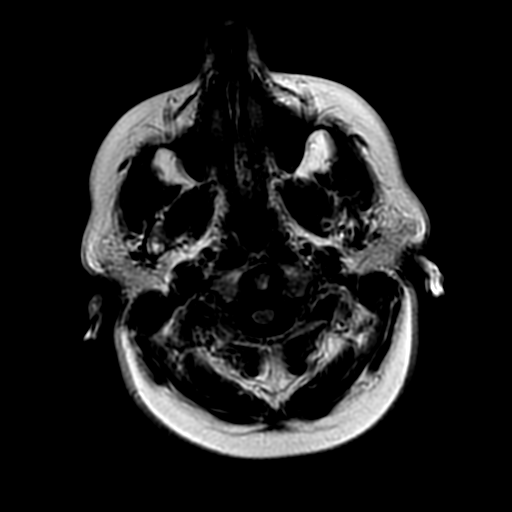
[im 26/26]
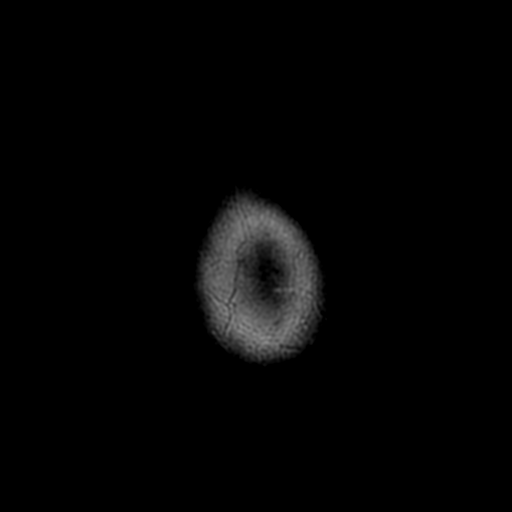

[Series 8: DWI · coronal · 4.0mm · 0.94mm/px · 7 of 72 slices shown (2 of 2)]
[im 1/72]
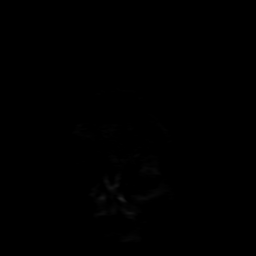
[im 12/72]
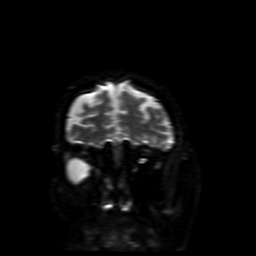
[im 24/72]
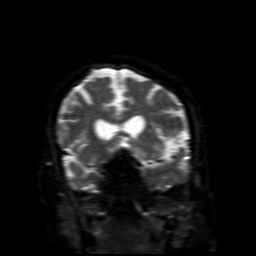
[im 36/72]
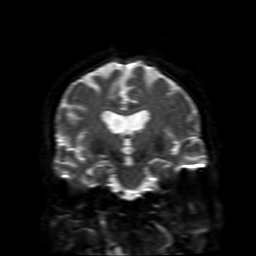
[im 48/72]
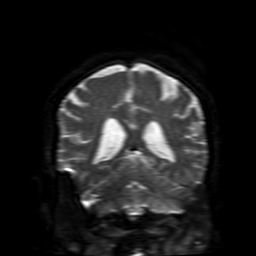
[im 60/72]
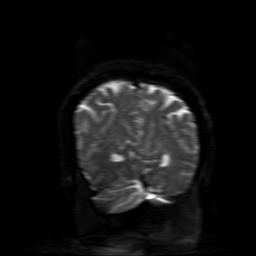
[im 72/72]
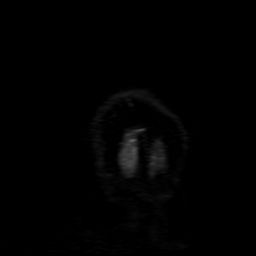

[Series 9: (person_name) · axial · 3.0mm · 0.47mm/px · z∈[-50,-33]mm · 2 of 104 slices shown]
[im 1/104]
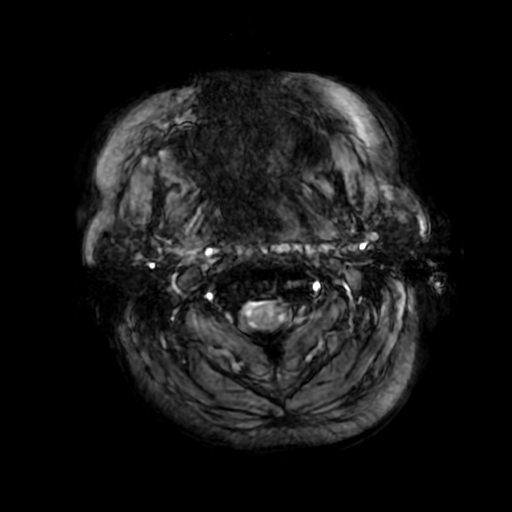
[im 13/104]
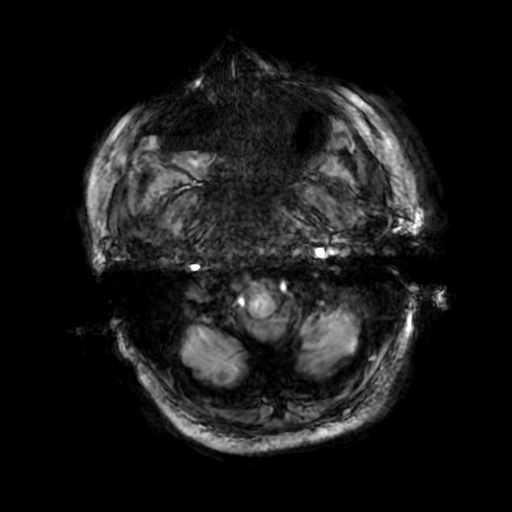

[Series 11: T2 · coronal · 5.0mm · 0.47mm/px · 3 of 30 slices shown (2 of 2)]
[im 1/30]
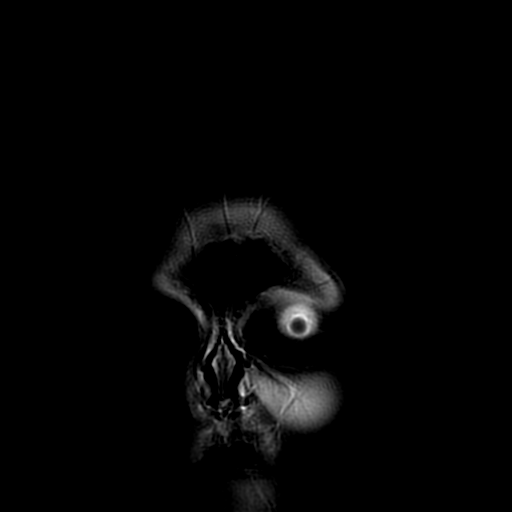
[im 15/30]
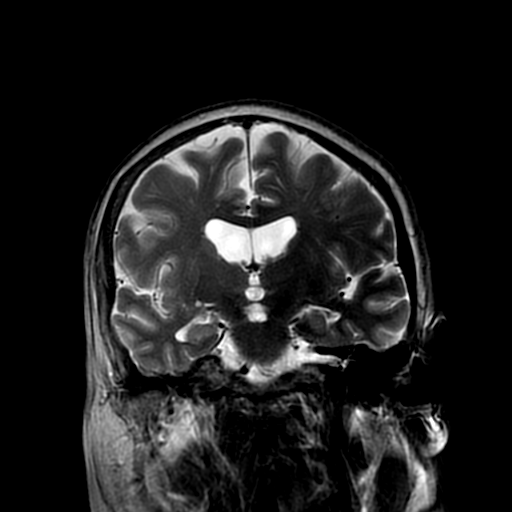
[im 30/30]
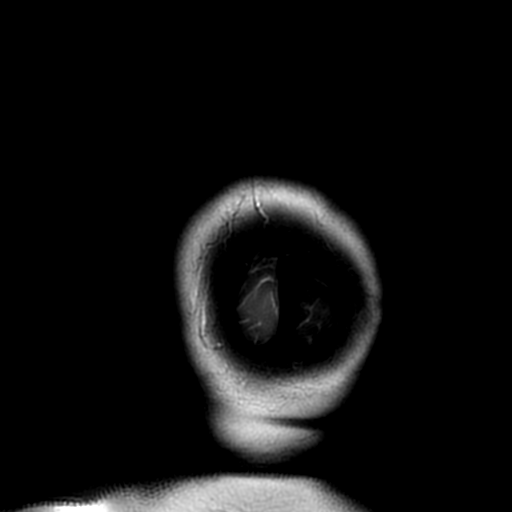

[Series 450: ADC · axial · 3.0mm · 0.94mm/px · z∈[-51,+101]mm · 5 of 52 slices shown (1 of 2)]
[im 1/52]
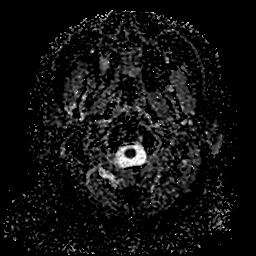
[im 13/52]
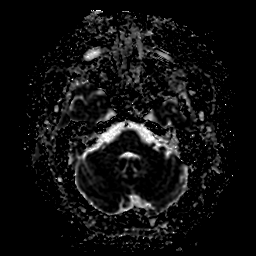
[im 26/52]
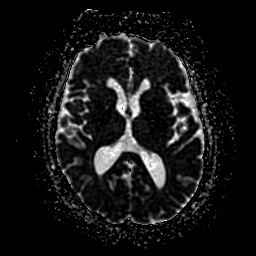
[im 39/52]
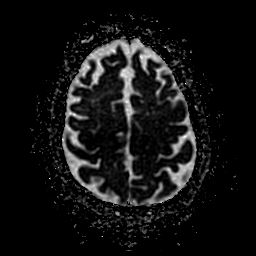
[im 52/52]
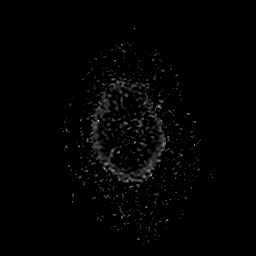

[Series 850: ADC · coronal · 4.0mm · 0.94mm/px · 3 of 36 slices shown (2 of 2)]
[im 1/36]
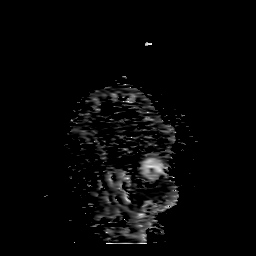
[im 18/36]
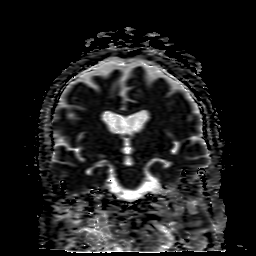
[im 36/36]
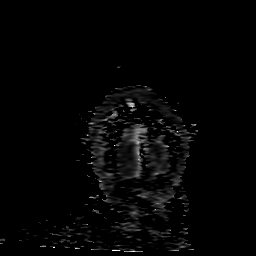

[36 of 48 positions shown; findings below may reference images not displayed]

FINDINGS: Brain: No focal diffusion restriction to indicate acute infarct. No
intraparenchymal hemorrhage. There is mild multifocal hyperintense
T2-weighted signal within the periventricular white matter, most
often seen in the setting of chronic microvascular ischemia. No mass
lesion or midline shift. No hydrocephalus or extra-axial fluid
collection. The midline structures are normal. No age advanced or
lobar predominant atrophy.

Vascular: Major intracranial arterial and venous sinus flow voids
are preserved. No evidence of chronic microhemorrhage or amyloid
angiopathy.

Skull and upper cervical spine: The visualized skull base,
calvarium, upper cervical spine and extracranial soft tissues are
normal.

Sinuses/Orbits: No fluid levels or advanced mucosal thickening. No
mastoid effusion. Normal orbits.
IMPRESSION: 1. No acute intracranial abnormality.
2. Findings of mild chronic microvascular ischemia.

## 2018-05-28 IMAGING — CT CT HEAD CODE STROKE
3 of 4 series · 16 of 47 positions shown, 19 images · non-contrast
Comparison: None.

CLINICAL DATA: Code stroke.  Confusion

EXAM:
CT HEAD WITHOUT CONTRAST
TECHNIQUE: Contiguous axial images were obtained from the base of the skull
through the vertex without intravenous contrast.

[Series 201: head w/o, idose (1) · axial · non-contrast · 0.49mm/px · z∈[+92,+232]mm · 10 of 34 slices shown, 13 images]
[im 3/34  brain]
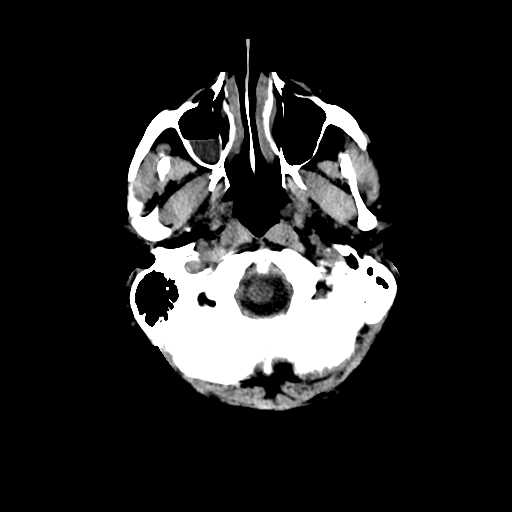
[im 3/34  bone]
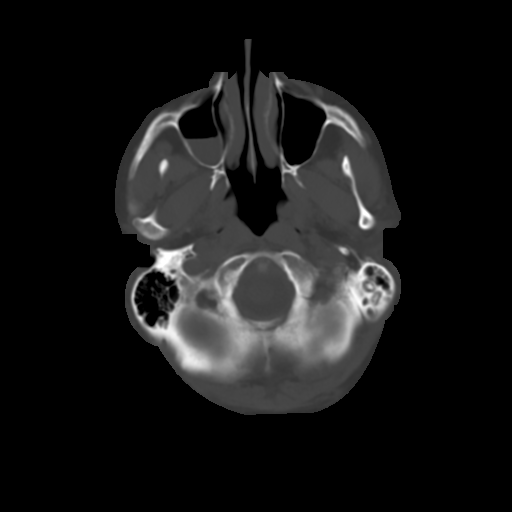
[im 5/34  brain]
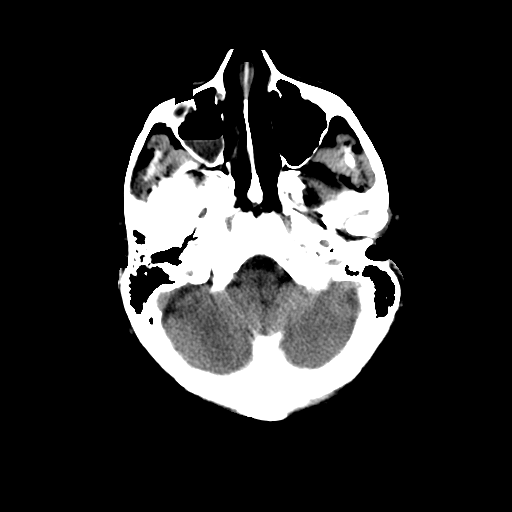
[im 10/34  brain]
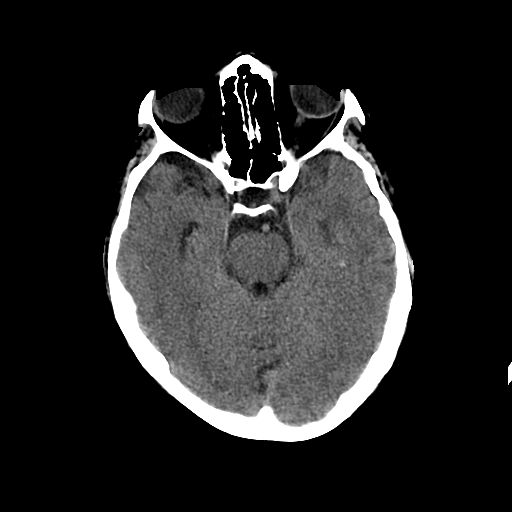
[im 12/34  brain]
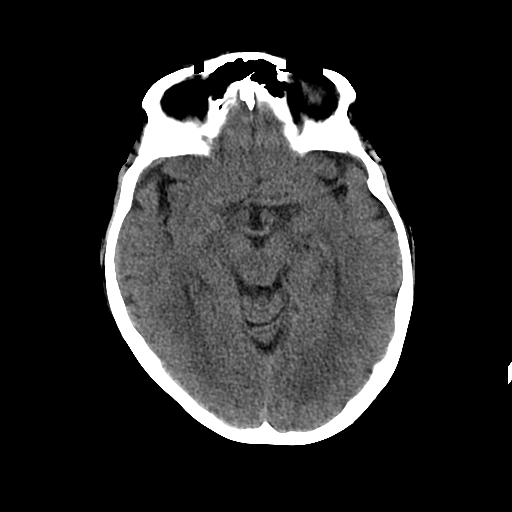
[im 15/34  brain]
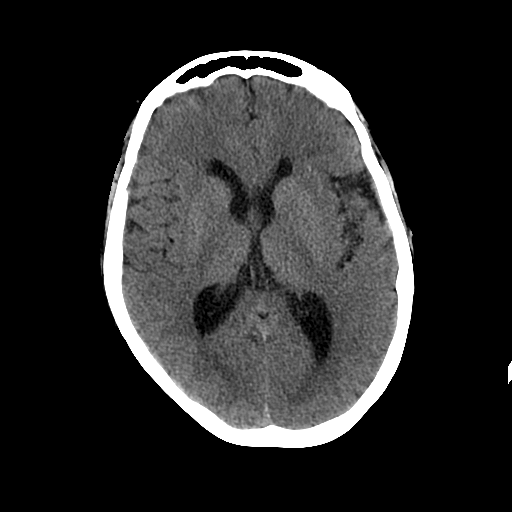
[im 15/34  bone]
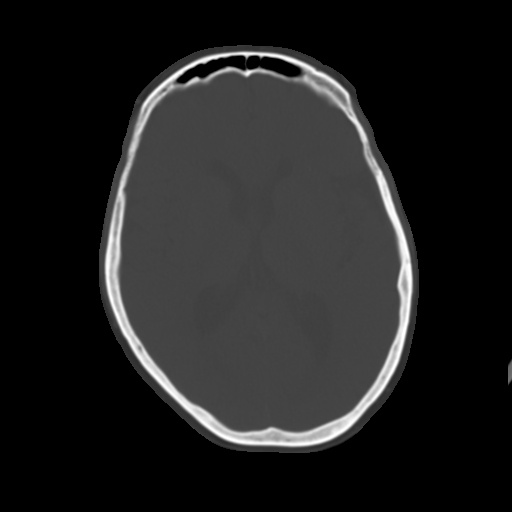
[im 19/34  brain]
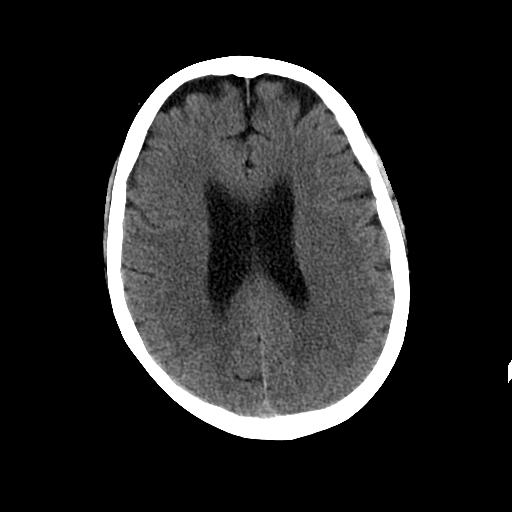
[im 22/34  brain]
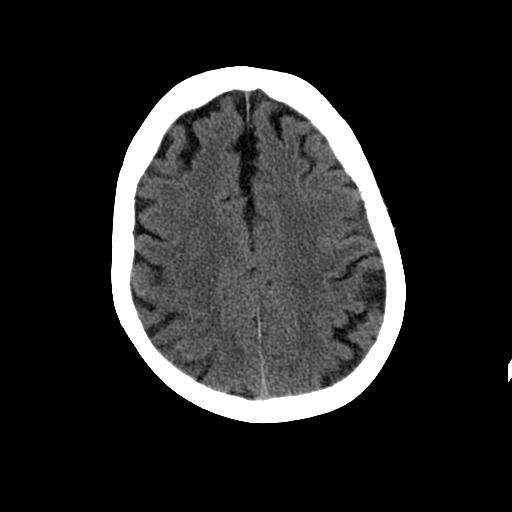
[im 24/34  brain]
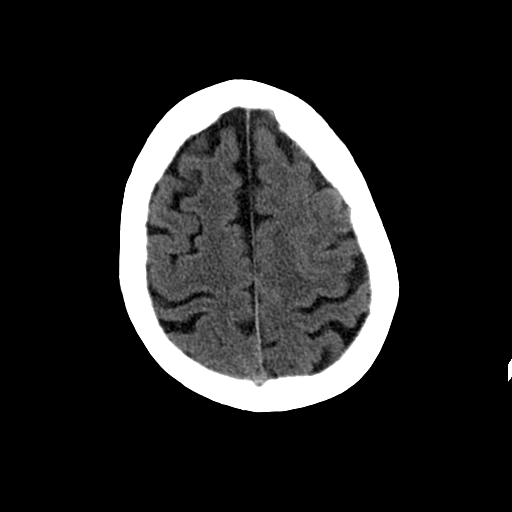
[im 29/34  brain]
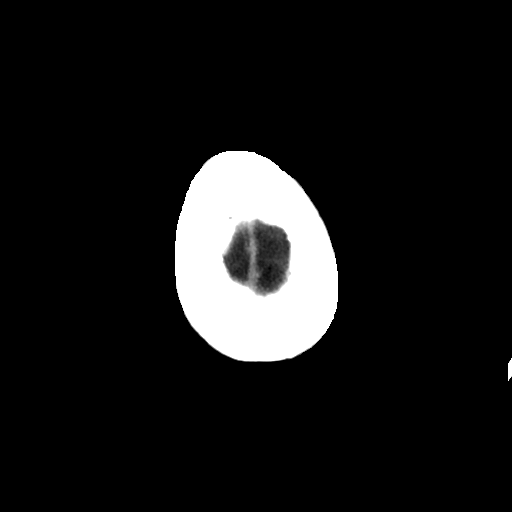
[im 29/34  bone]
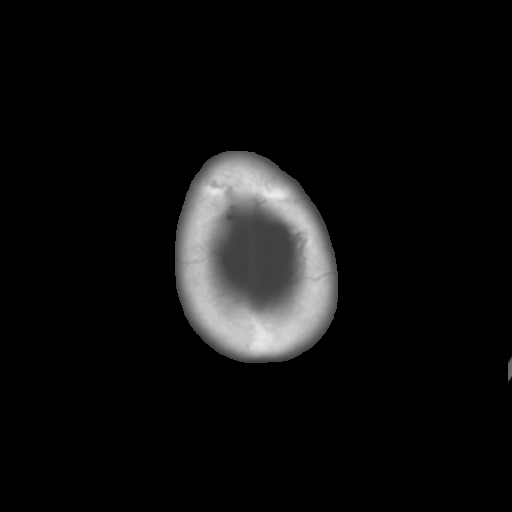
[im 31/34  brain]
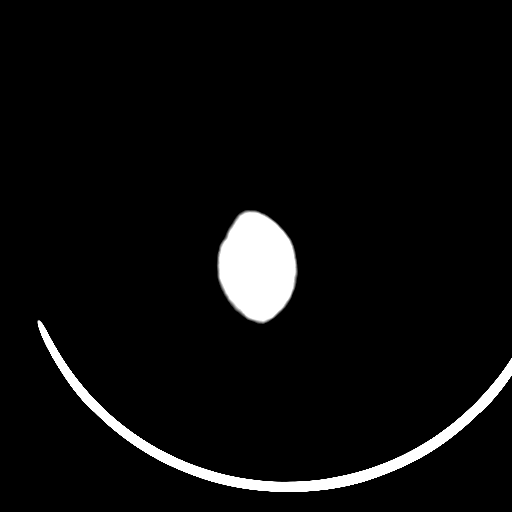

[Series 203: coronal st, idose (1) · coronal · 0.40mm/px · 3 of 83 slices shown]
[im 28/83  brain]
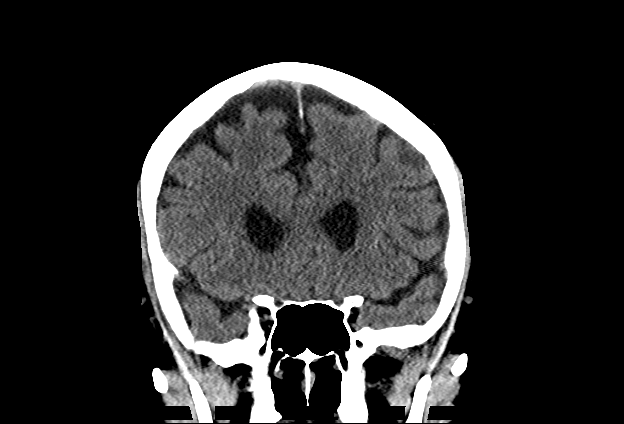
[im 37/83  brain]
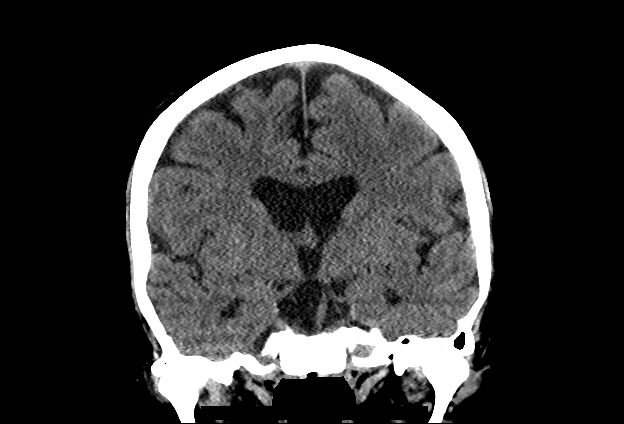
[im 46/83  brain]
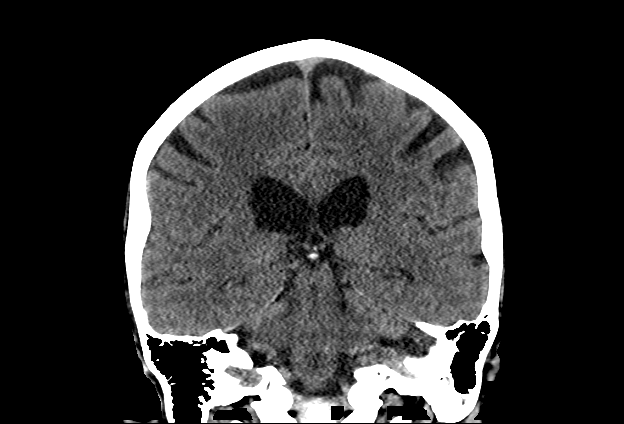

[Series 204: sagittal st, idose (1) · sagittal · 0.40mm/px · 3 of 83 slices shown]
[im 28/83  brain]
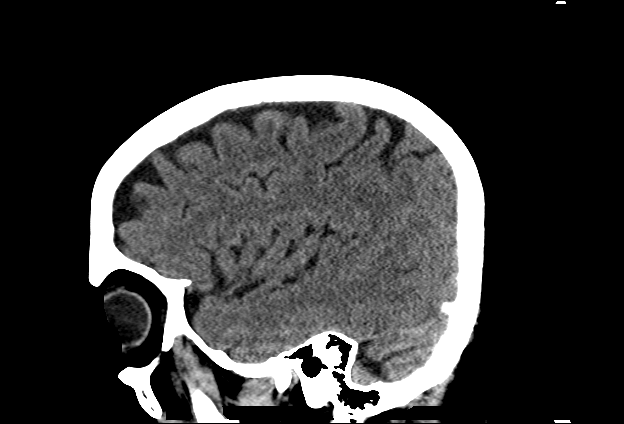
[im 42/83  brain]
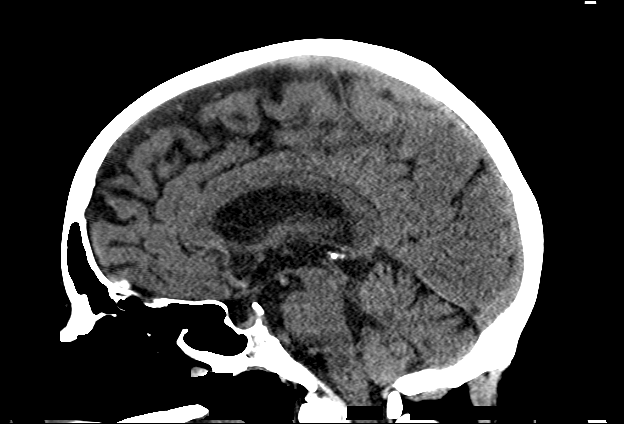
[im 55/83  brain]
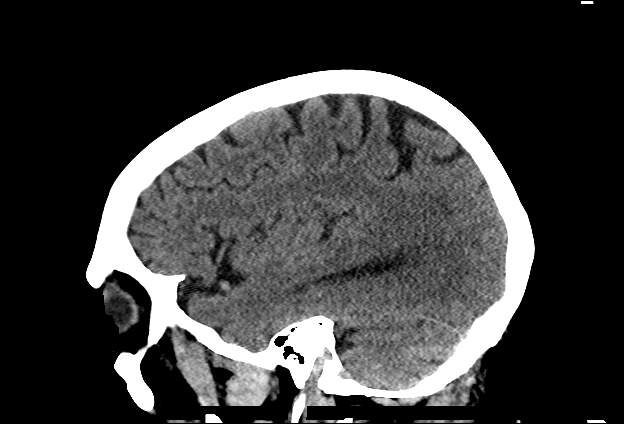

[16 of 47 positions shown; findings below may reference images not displayed]

FINDINGS: Brain: No mass lesion, intraparenchymal hemorrhage or extra-axial
collection. No evidence of acute cortical infarct. There is
periventricular hypoattenuation compatible with chronic
microvascular disease. Mildly advanced atrophy for age.

Vascular: No hyperdense vessel or unexpected calcification.

Skull: Normal visualized skull base, calvarium and extracranial soft
tissues.

Sinuses/Orbits: Right maxillary sinus fluid level. Normal orbits.

ASPECTS (Alberta Stroke Program Early CT Score)

- Ganglionic level infarction (caudate, lentiform nuclei, internal
capsule, insula, M1-M3 cortex): 7

- Supraganglionic infarction (M4-M6 cortex): 3

Total score (0-10 with 10 being normal): 10
IMPRESSION: 1. Mild age advanced atrophy without acute intracranial abnormality.
2. Fluid level in the right maxillary sinus. Correlate clinically
for signs of acute sinusitis.
3. ASPECTS is 10.

These results were called by telephone at the time of interpretation
on 06/21/2016 at [DATE] to Dr. SORIN OXENDINE, who verbally
acknowledged these results.

## 2018-06-01 ENCOUNTER — Encounter: Payer: Self-pay | Admitting: Physician Assistant

## 2018-06-01 ENCOUNTER — Other Ambulatory Visit: Payer: Self-pay | Admitting: Physician Assistant

## 2018-06-01 MED ORDER — OMEGA-3-ACID ETHYL ESTERS 1 G PO CAPS: 2.0000 | ORAL_CAPSULE | Freq: Two times a day (BID) | ORAL | 3 refills | Status: DC

## 2018-06-02 ENCOUNTER — Telehealth: Payer: Self-pay | Admitting: Osteopathic Medicine

## 2018-06-02 MED ORDER — GABAPENTIN 300 MG PO CAPS: 300.0000 mg | ORAL_CAPSULE | Freq: Four times a day (QID) | ORAL | 1 refills | Status: DC

## 2018-06-02 NOTE — Telephone Encounter (Signed)
Received fax from [facility] that Omega 3  was approved from 05/03/2018 through 06/01/2021. Pharmacy notified and forms sent to scan.

## 2018-06-03 ENCOUNTER — Encounter: Payer: Self-pay | Admitting: Physician Assistant

## 2018-06-09 ENCOUNTER — Encounter: Payer: Self-pay | Admitting: Physician Assistant

## 2018-06-09 MED ORDER — PHENTERMINE-TOPIRAMATE ER 7.5-46 MG PO CP24: 1.0000 | ORAL_CAPSULE | Freq: Every morning | ORAL | 0 refills | Status: DC

## 2018-06-13 ENCOUNTER — Other Ambulatory Visit: Payer: Self-pay | Admitting: Physician Assistant

## 2018-06-13 ENCOUNTER — Encounter: Payer: Self-pay | Admitting: Physician Assistant

## 2018-06-13 DIAGNOSIS — R600 Localized edema: Secondary | ICD-10-CM

## 2018-06-28 ENCOUNTER — Telehealth: Payer: Self-pay

## 2018-06-28 NOTE — Telephone Encounter (Signed)
Prior Authorization required for Viibryd 40 mg tablets.   [id number]

## 2018-06-29 NOTE — Telephone Encounter (Signed)
Viibryd 06/29/2018 Drug is covered by current benefit plan. No further PA activity needed. Pharmacy aware and per Baird Lyons its not in stock and they will have some more in stock.

## 2018-06-30 ENCOUNTER — Encounter: Payer: Self-pay | Admitting: Physician Assistant

## 2018-06-30 ENCOUNTER — Ambulatory Visit (INDEPENDENT_AMBULATORY_CARE_PROVIDER_SITE_OTHER): Payer: 59 | Admitting: Physician Assistant

## 2018-06-30 VITALS — HR 84 | Ht 63.0 in | Wt 190.2 lb

## 2018-06-30 DIAGNOSIS — R4701 Aphasia: Secondary | ICD-10-CM

## 2018-06-30 DIAGNOSIS — Z6833 Body mass index (BMI) 33.0-33.9, adult: Secondary | ICD-10-CM

## 2018-06-30 DIAGNOSIS — M797 Fibromyalgia: Secondary | ICD-10-CM

## 2018-06-30 DIAGNOSIS — J4520 Mild intermittent asthma, uncomplicated: Secondary | ICD-10-CM | POA: Diagnosis not present

## 2018-06-30 DIAGNOSIS — E6609 Other obesity due to excess calories: Secondary | ICD-10-CM | POA: Diagnosis not present

## 2018-06-30 DIAGNOSIS — R4789 Other speech disturbances: Secondary | ICD-10-CM | POA: Diagnosis not present

## 2018-06-30 DIAGNOSIS — E782 Mixed hyperlipidemia: Secondary | ICD-10-CM

## 2018-06-30 MED ORDER — ALBUTEROL SULFATE HFA 108 (90 BASE) MCG/ACT IN AERS: INHALATION_SPRAY | RESPIRATORY_TRACT | 1 refills | Status: DC

## 2018-06-30 MED ORDER — ATORVASTATIN CALCIUM 40 MG PO TABS: 40.0000 mg | ORAL_TABLET | Freq: Every day | ORAL | 1 refills | Status: DC

## 2018-06-30 MED ORDER — GABAPENTIN 300 MG PO CAPS: 300.0000 mg | ORAL_CAPSULE | Freq: Four times a day (QID) | ORAL | 1 refills | Status: DC

## 2018-06-30 MED ORDER — PHENTERMINE-TOPIRAMATE ER 15-92 MG PO CP24: 1.0000 | ORAL_CAPSULE | Freq: Every morning | ORAL | 1 refills | Status: DC

## 2018-06-30 NOTE — Progress Notes (Signed)
-  off Suboxone for about a month now  -192 lbs on 03/11/18, today weight 190 lbs today.. some extra eating during quarantine, not exercising   -Fasting AM sugars have been 80s-90s

## 2018-06-30 NOTE — Progress Notes (Signed)
Patient ID: Bridget Johnston, female   DOB: 02/21/1961, 58 y.o.   MRN: [id number] .[name] KitchenVirtual Visit via Video Note  I connected with Bridget Johnston on 07/01/18 at 11:30 AM EDT by a video enabled telemedicine application and verified that I am speaking with the correct person using two identifiers.   I discussed the limitations of evaluation and management by telemedicine and the availability of in person appointments. The patient expressed understanding and agreed to proceed.  History of Present Illness: Pt is a 58 yo obese female with HTN, anxiety, MDD, HLD, and Fibromyalgia who presents to the clinic to work on weight loss and get medication refills.   Pt has hx of opiate addiction and was on suboxone she has weaned herself off and doing GREAT!   Her pain is well managed with gabapentin.   Her mood is doing wonderful.   She continues to lose weight and at 190. She has lost 2lbs since January. She would like to increase dose of topamax/phentermine. No problems or side effects. She is walking regularly.   Denies any problematic SOB or cough. Has asthma. Needs albuterol refill but only using PRN. Every few days.   .. Active Ambulatory Problems    Diagnosis Date Noted  . History of pyelonephritis 10/08/2010  . Opiate addiction (HCC) 01/03/2014  . Asthma 01/03/2014  . Hyperlipidemia 07/23/2014  . Hyperglycemia 07/23/2014  . Vitamin D deficiency 07/23/2014  . Elevated liver enzymes 07/23/2014  . Anxiety and depression 07/23/2014  . Fibromyalgia 07/23/2014  . Echocardiogram shows left ventricular diastolic dysfunction 06/09/2016  . Hypotension due to drugs 06/19/2016  . Aphasia 06/21/2016  . History of ischemic stroke in prior three months 06/21/2016  . Essential hypertension 06/21/2016  . 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 Hyponatremia   . Encephalitis   . History of pneumonia 09/10/2016  . History of acute respiratory failure 09/10/2016  . Peripheral edema 09/10/2016  .  Awareness alteration, transient 10/12/2016  . BMI 39.0-39.9,adult 11/03/2016  . Pre-diabetes 01/01/2017  . History of aphasia 01/01/2017  . Class 2 severe obesity due to excess calories with serious comorbidity and body mass index (BMI) of 39.0 to 39.9 in adult (HCC) 01/01/2017  . Fatigue 01/01/2017   Resolved Ambulatory Problems    Diagnosis Date Noted  . UTI 10/08/2010  . Acute respiratory failure with hypoxia (HCC) 06/19/2016  . Diabetes mellitus type 2 in obese (HCC)   . Morbid obesity (HCC)   . Diabetes mellitus without complication (HCC) 09/03/2017   Past Medical History:  Diagnosis Date  . Arthritis   . Back pain   . Finger pain   . Hand pain   . High triglycerides   . Leg pain    reviewed med, allergy, problem list.       Observations/Objective: No acute distress.  No labored breathing.  Normal mood.   .. Today's Vitals   06/30/18 1105  Pulse: 84  Weight: 190 lb 3.2 oz (86.3 kg)  Height: 5\' 3"  (1.6 m)   Body mass index is 33.69 kg/m. .. Depression screen [facility] 2/9 06/30/2018 03/14/2018 11/24/2017 09/03/2017 11/03/2016  Decreased Interest 0 0 0 1 0  Down, Depressed, Hopeless 0 0 0 0 0  PHQ - 2 Score 0 0 0 1 0  Altered sleeping 0 0 0 0 -  Tired, decreased energy 0 - 0 0 -  Change in appetite 0 0 0 0 -  Feeling bad or failure about yourself  1 0  0 0 -  Trouble concentrating 0 0 0 0 -  Moving slowly or fidgety/restless 0 0 0 0 -  Suicidal thoughts 0 0 0 0 -  PHQ-9 Score 1 0 0 1 -  Difficult doing work/chores Not difficult at all Not difficult at all - Not difficult at all -   .. GAD 7 : Generalized Anxiety Score 06/30/2018 11/24/2017 09/03/2017 01/01/2017  Nervous, Anxious, on Edge 2 1 1 2   Control/stop worrying 0 2 1 3   Worry too much - different things 1 2 1 2   Trouble relaxing 0 0 1 3  Restless 0 0 0 1  Easily annoyed or irritable 0 0 1 2  Afraid - awful might happen 1 0 0 2  Total GAD 7 Score 4 5 5 15   Anxiety Difficulty Not difficult at all Not  difficult at all Not difficult at all Very difficult      Assessment and Plan: [name] Kitchen.[name] Kitchen.Bridget Johnston was seen today for medication refill and weight check.  Diagnoses and all orders for this visit:  Class 1 obesity due to excess calories without serious comorbidity with body mass index (BMI) of 33.0 to 33.9 in adult -     Phentermine-Topiramate 15-92 MG CP24; Take 1 tablet by mouth every morning.  Mild intermittent asthma without complication -     albuterol (PROAIR HFA) 108 (90 Base) MCG/ACT inhaler; INHALE 2 PUFFS INTO THE LUNGS EVERY 4 HOURS AS NEEDED FOR WHEEZING OR SHORTNESS OF BREATH  Word finding difficulty -     atorvastatin (LIPITOR) 40 MG tablet; Take 1 tablet (40 mg total) by mouth daily.  Aphasia -     atorvastatin (LIPITOR) 40 MG tablet; Take 1 tablet (40 mg total) by mouth daily.  Mixed hyperlipidemia -     atorvastatin (LIPITOR) 40 MG tablet; Take 1 tablet (40 mg total) by mouth daily.  Fibromyalgia -     gabapentin (NEURONTIN) 300 MG capsule; Take 1 capsule (300 mg total) by mouth 4 (four) times daily.   Increased dose of qsymia. Follow up in 3 months. Continue the great work.  Refilled other medications.  Up to date on labs.   Follow Up Instructions:    I discussed the assessment and treatment plan with the patient. The patient was provided an opportunity to ask questions and all were answered. The patient agreed with the plan and demonstrated an understanding of the instructions.   The patient was advised to call back or seek an in-person evaluation if the symptoms worsen or if the condition fails to improve as anticipated.  I provided 25 minutes of non-face-to-face time during this encounter.   Tandy GawJade Latish Toutant, PA-C

## 2018-07-04 ENCOUNTER — Other Ambulatory Visit: Payer: Self-pay | Admitting: Physician Assistant

## 2018-07-28 ENCOUNTER — Other Ambulatory Visit: Payer: Self-pay | Admitting: Physician Assistant

## 2018-07-28 DIAGNOSIS — F329 Major depressive disorder, single episode, unspecified: Secondary | ICD-10-CM

## 2018-07-28 DIAGNOSIS — F419 Anxiety disorder, unspecified: Secondary | ICD-10-CM

## 2018-07-28 DIAGNOSIS — F32A Depression, unspecified: Secondary | ICD-10-CM

## 2018-08-11 ENCOUNTER — Encounter: Payer: Self-pay | Admitting: Physician Assistant

## 2018-08-12 MED ORDER — DICLOFENAC SODIUM 1 % TD GEL: 4.0000 g | Freq: Four times a day (QID) | TRANSDERMAL | 1 refills | Status: DC

## 2018-08-22 ENCOUNTER — Encounter: Payer: Self-pay | Admitting: Physician Assistant

## 2018-08-22 ENCOUNTER — Ambulatory Visit: Payer: 59 | Admitting: Neurology

## 2018-08-22 MED ORDER — ONETOUCH ULTRA VI STRP: ORAL_STRIP | 12 refills | Status: DC

## 2018-08-22 MED ORDER — ONETOUCH ULTRA MINI W/DEVICE KIT: PACK | 0 refills | Status: AC

## 2018-08-22 MED ORDER — ONETOUCH ULTRASOFT LANCETS MISC: 12 refills | Status: DC

## 2018-08-22 NOTE — Telephone Encounter (Signed)
For hyperglycemia on problem list.

## 2018-08-25 ENCOUNTER — Other Ambulatory Visit: Payer: Self-pay | Admitting: Physician Assistant

## 2018-08-25 ENCOUNTER — Other Ambulatory Visit: Payer: Self-pay | Admitting: Neurology

## 2018-08-25 ENCOUNTER — Ambulatory Visit: Payer: 59 | Admitting: Neurology

## 2018-08-25 ENCOUNTER — Encounter: Payer: Self-pay | Admitting: Physician Assistant

## 2018-08-25 DIAGNOSIS — F329 Major depressive disorder, single episode, unspecified: Secondary | ICD-10-CM

## 2018-08-25 DIAGNOSIS — M797 Fibromyalgia: Secondary | ICD-10-CM

## 2018-08-25 DIAGNOSIS — F419 Anxiety disorder, unspecified: Secondary | ICD-10-CM

## 2018-08-25 MED ORDER — GABAPENTIN 300 MG PO CAPS: 300.0000 mg | ORAL_CAPSULE | Freq: Four times a day (QID) | ORAL | 1 refills | Status: DC

## 2018-08-25 MED ORDER — BUSPIRONE HCL 10 MG PO TABS: 10.0000 mg | ORAL_TABLET | Freq: Two times a day (BID) | ORAL | 1 refills | Status: DC

## 2018-08-25 MED ORDER — VIIBRYD 40 MG PO TABS: 40.0000 mg | ORAL_TABLET | Freq: Every day | ORAL | 1 refills | Status: DC

## 2018-08-27 ENCOUNTER — Other Ambulatory Visit: Payer: Self-pay | Admitting: Physician Assistant

## 2018-08-27 DIAGNOSIS — M797 Fibromyalgia: Secondary | ICD-10-CM

## 2018-09-03 ENCOUNTER — Other Ambulatory Visit: Payer: Self-pay | Admitting: Physician Assistant

## 2018-09-03 DIAGNOSIS — F419 Anxiety disorder, unspecified: Secondary | ICD-10-CM

## 2018-09-03 DIAGNOSIS — F329 Major depressive disorder, single episode, unspecified: Secondary | ICD-10-CM

## 2018-09-16 ENCOUNTER — Ambulatory Visit: Payer: 59 | Admitting: Physician Assistant

## 2018-09-23 ENCOUNTER — Ambulatory Visit: Payer: 59 | Admitting: Physician Assistant

## 2018-09-23 ENCOUNTER — Encounter: Payer: Self-pay | Admitting: Physician Assistant

## 2018-09-23 ENCOUNTER — Ambulatory Visit (INDEPENDENT_AMBULATORY_CARE_PROVIDER_SITE_OTHER): Payer: 59 | Admitting: Physician Assistant

## 2018-09-23 ENCOUNTER — Other Ambulatory Visit: Payer: Self-pay

## 2018-09-23 ENCOUNTER — Other Ambulatory Visit: Payer: Self-pay | Admitting: Physician Assistant

## 2018-09-23 VITALS — Ht 63.0 in

## 2018-09-23 DIAGNOSIS — E6609 Other obesity due to excess calories: Secondary | ICD-10-CM

## 2018-09-23 DIAGNOSIS — Z6833 Body mass index (BMI) 33.0-33.9, adult: Secondary | ICD-10-CM

## 2018-09-23 DIAGNOSIS — J4531 Mild persistent asthma with (acute) exacerbation: Secondary | ICD-10-CM

## 2018-09-23 DIAGNOSIS — F329 Major depressive disorder, single episode, unspecified: Secondary | ICD-10-CM

## 2018-09-23 DIAGNOSIS — R404 Transient alteration of awareness: Secondary | ICD-10-CM

## 2018-09-23 DIAGNOSIS — F419 Anxiety disorder, unspecified: Secondary | ICD-10-CM | POA: Diagnosis not present

## 2018-09-23 DIAGNOSIS — R4701 Aphasia: Secondary | ICD-10-CM

## 2018-09-23 MED ORDER — BUDESONIDE-FORMOTEROL FUMARATE 160-4.5 MCG/ACT IN AERO: 2.0000 | INHALATION_SPRAY | Freq: Two times a day (BID) | RESPIRATORY_TRACT | 5 refills | Status: DC

## 2018-09-23 MED ORDER — PHENTERMINE-TOPIRAMATE ER 15-92 MG PO CP24: 1.0000 | ORAL_CAPSULE | Freq: Every morning | ORAL | 5 refills | Status: DC

## 2018-09-23 MED ORDER — VIIBRYD 40 MG PO TABS: 40.0000 mg | ORAL_TABLET | Freq: Every day | ORAL | 5 refills | Status: DC

## 2018-09-23 MED ORDER — BUSPIRONE HCL 10 MG PO TABS: 10.0000 mg | ORAL_TABLET | Freq: Two times a day (BID) | ORAL | 5 refills | Status: DC

## 2018-09-23 NOTE — Progress Notes (Signed)
Patient ID: Bridget Johnston, female   DOB: 03/03/1961, 58 y.o.   MRN: [id number] .[name] Kitchen.Virtual Visit via Video Note  I connected with Bridget Johnston on 09/23/18 at  2:00 PM EDT by a video enabled telemedicine application and verified that I am speaking with the correct person using two identifiers.  Location: Patient: home Provider: clinic   I discussed the limitations of evaluation and management by telemedicine and the availability of in person appointments. The patient expressed understanding and agreed to proceed.  History of Present Illness: Pt is a 58 yo obese female with HTN, Asthma, anxiety, depression and pre-diabetes who calls in for refills of her medications.   She is doing good. No problems or concerns. She continues to work on weight loss. The qsymia continues to help her. She has lost another 2lbs in last 3 months. She is trying to walk more. She is watching her diet.  She feels good. She was under 190 but today weighed 190.   Her mood is good. She is not working during [employer] pandemic. She is spending a lot of time with her grandkids.   .. Active Ambulatory Problems    Diagnosis Date Noted  . History of pyelonephritis 10/08/2010  . Opiate addiction (HCC) 01/03/2014  . Asthma 01/03/2014  . Hyperlipidemia 07/23/2014  . Hyperglycemia 07/23/2014  . Vitamin D deficiency 07/23/2014  . Elevated liver enzymes 07/23/2014  . Anxiety and depression 07/23/2014  . Fibromyalgia 07/23/2014  . Echocardiogram shows left ventricular diastolic dysfunction 06/09/2016  . Hypotension due to drugs 06/19/2016  . Aphasia 06/21/2016  . History of ischemic stroke in prior three months 06/21/2016  . Essential hypertension 06/21/2016  . 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 Hyponatremia   . Encephalitis   . History of pneumonia 09/10/2016  . History of acute respiratory failure 09/10/2016  . Peripheral edema 09/10/2016  . Awareness alteration, transient 10/12/2016  . BMI 39.0-39.9,adult  11/03/2016  . Pre-diabetes 01/01/2017  . History of aphasia 01/01/2017  . Class 1 obesity due to excess calories without serious comorbidity with body mass index (BMI) of 33.0 to 33.9 in adult 01/01/2017  . Fatigue 01/01/2017   Resolved Ambulatory Problems    Diagnosis Date Noted  . UTI 10/08/2010  . Acute respiratory failure with hypoxia (HCC) 06/19/2016  . Diabetes mellitus type 2 in obese (HCC)   . Morbid obesity (HCC)   . Diabetes mellitus without complication (HCC) 09/03/2017   Past Medical History:  Diagnosis Date  . Arthritis   . Back pain   . Finger pain   . Hand pain   . High triglycerides   . Leg pain    Reviewed med, allergy, problem list.     Observations/Objective: No acute distress. Normal breathing.  Normal mood.   .. Today's Vitals   09/23/18 1130  Height: 5\' 3"  (1.6 m)   Body mass index is 33.69 kg/m.  .. [facility] 2/9 09/23/2018 06/30/2018 03/14/2018 11/24/2017 09/03/2017  Decreased Interest 0 0 0 0 1  Down, Depressed, Hopeless 0 0 0 0 0  PHQ - 2 Score 0 0 0 0 1  Altered sleeping - 0 0 0 0  Tired, decreased energy - 0 - 0 0  Change in appetite - 0 0 0 0  Feeling bad or failure about yourself  - 1 0 0 0  Trouble concentrating - 0 0 0 0  Moving slowly or fidgety/restless - 0 0 0 0  Suicidal thoughts - 0  0 0 0  PHQ-9 Score - 1 0 0 1  Difficult doing work/chores - Not difficult at all Not difficult at all - Not difficult at all    ... GAD 7 : Generalized Anxiety Score 09/23/2018 06/30/2018 11/24/2017 09/03/2017  Nervous, Anxious, on Edge 0 2 1 1   Control/stop worrying 0 0 2 1  Worry too much - different things 0 1 2 1   Trouble relaxing 0 0 0 1  Restless 0 0 0 0  Easily annoyed or irritable 0 0 0 1  Afraid - awful might happen 0 1 0 0  Total GAD 7 Score 0 4 5 5   Anxiety Difficulty Not difficult at all Not difficult at all Not difficult at all Not difficult at all      Assessment and Plan: [name] KitchenMarland KitchenDiagnoses and all orders for this  visit:  Class 1 obesity due to excess calories without serious comorbidity with body mass index (BMI) of 33.0 to 33.9 in adult -     Phentermine-Topiramate 15-92 MG CP24; Take 1 tablet by mouth every morning.  Anxiety and depression -     Vilazodone HCl (VIIBRYD) 40 MG TABS; Take 1 tablet (40 mg total) by mouth daily. -     busPIRone (BUSPAR) 10 MG tablet; Take 1 tablet (10 mg total) by mouth 2 (two) times daily.  Mild persistent asthma with acute exacerbation -     budesonide-formoterol (SYMBICORT) 160-4.5 MCG/ACT inhaler; Inhale 2 puffs into the lungs 2 (two) times daily.   PHQ and GAD look great. Continues to slowly lose weight. Feeling great. Refills given to follow up in 3 months. Try to add more exercise into daily life.   Follow Up Instructions:    I discussed the assessment and treatment plan with the patient. The patient was provided an opportunity to ask questions and all were answered. The patient agreed with the plan and demonstrated an understanding of the instructions.   The patient was advised to call back or seek an in-person evaluation if the symptoms worsen or if the condition fails to improve as anticipated.    Iran Planas, PA-C

## 2018-09-24 ENCOUNTER — Encounter: Payer: Self-pay | Admitting: Physician Assistant

## 2018-10-21 ENCOUNTER — Other Ambulatory Visit: Payer: Self-pay | Admitting: Physician Assistant

## 2018-10-21 DIAGNOSIS — M797 Fibromyalgia: Secondary | ICD-10-CM

## 2018-11-10 ENCOUNTER — Other Ambulatory Visit: Payer: Self-pay | Admitting: Physician Assistant

## 2018-11-10 DIAGNOSIS — R600 Localized edema: Secondary | ICD-10-CM

## 2018-11-16 ENCOUNTER — Other Ambulatory Visit (HOSPITAL_COMMUNITY)
Admission: RE | Admit: 2018-11-16 | Discharge: 2018-11-16 | Disposition: A | Discharge status: Home / Self Care | Payer: 59 | Source: Ambulatory Visit | Attending: Physician Assistant | Admitting: Physician Assistant

## 2018-11-16 ENCOUNTER — Ambulatory Visit (INDEPENDENT_AMBULATORY_CARE_PROVIDER_SITE_OTHER): Payer: 59 | Admitting: Physician Assistant

## 2018-11-16 ENCOUNTER — Other Ambulatory Visit: Payer: Self-pay

## 2018-11-16 ENCOUNTER — Encounter: Payer: Self-pay | Admitting: Physician Assistant

## 2018-11-16 VITALS — BP 133/78 | HR 92 | Temp 98.4°F | Ht 63.0 in | Wt 184.0 lb

## 2018-11-16 DIAGNOSIS — R7303 Prediabetes: Secondary | ICD-10-CM | POA: Diagnosis not present

## 2018-11-16 DIAGNOSIS — Z131 Encounter for screening for diabetes mellitus: Secondary | ICD-10-CM

## 2018-11-16 DIAGNOSIS — Z0000 Encounter for general adult medical examination without abnormal findings: Secondary | ICD-10-CM | POA: Diagnosis not present

## 2018-11-16 DIAGNOSIS — Z1231 Encounter for screening mammogram for malignant neoplasm of breast: Secondary | ICD-10-CM

## 2018-11-16 DIAGNOSIS — Z20822 Contact with and (suspected) exposure to covid-19: Secondary | ICD-10-CM

## 2018-11-16 DIAGNOSIS — Z124 Encounter for screening for malignant neoplasm of cervix: Secondary | ICD-10-CM | POA: Insufficient documentation

## 2018-11-16 DIAGNOSIS — D508 Other iron deficiency anemias: Secondary | ICD-10-CM

## 2018-11-16 DIAGNOSIS — Z23 Encounter for immunization: Secondary | ICD-10-CM | POA: Diagnosis not present

## 2018-11-16 DIAGNOSIS — Z13220 Encounter for screening for lipoid disorders: Secondary | ICD-10-CM

## 2018-11-16 DIAGNOSIS — Z20828 Contact with and (suspected) exposure to other viral communicable diseases: Secondary | ICD-10-CM

## 2018-11-16 LAB — POCT UA - MICROALBUMIN
Albumin/Creatinine Ratio, Urine, POC: <30
Creatinine, POC: 300 mg/dL
Microalbumin Ur, POC: 30 mg/L

## 2018-11-16 LAB — POCT GLYCOSYLATED HEMOGLOBIN (HGB A1C): Hemoglobin A1C: 5.2 % (ref 4.0–5.6)

## 2018-11-16 NOTE — Patient Instructions (Signed)

## 2018-11-16 NOTE — Progress Notes (Signed)
Subjective:     Bridget Johnston is a 58 y.o. female and is here for a comprehensive physical exam. The patient reports no problems.  Social History   Socioeconomic History  . Marital status: Married    Spouse name: Not on file  . Number of children: Not on file  . Years of education: college  . Highest education level: Not on file  Occupational History  . Occupation: hairdresser    Fish farm manager: OTHER  Social Needs  . Financial resource strain: Not on file  . Food insecurity    Worry: Not on file    Inability: Not on file  . Transportation needs    Medical: Not on file    Non-medical: Not on file  Tobacco Use  . Smoking status: Former Smoker    Packs/day: 1.00    Years: 17.00    Pack years: 17.00    Types: Cigarettes, E-cigarettes    Quit date: 01/06/2012    Years since quitting: 6.8  . Smokeless tobacco: Never Used  Substance and Sexual Activity  . Alcohol use: No    Alcohol/week: 0.0 standard drinks  . Drug use: No    Comment: opiod adduction  . Sexual activity: Yes    Partners: Male    Birth control/protection: Surgical    Comment: BTL  Lifestyle  . Physical activity    Days per week: Not on file    Minutes per session: Not on file  . Stress: Not on file  Relationships  . Social Herbalist on phone: Not on file    Gets together: Not on file    Attends religious service: Not on file    Active member of club or organization: Not on file    Attends meetings of clubs or organizations: Not on file    Relationship status: Not on file  . Intimate partner violence    Fear of current or ex partner: Not on file    Emotionally abused: Not on file    Physically abused: Not on file    Forced sexual activity: Not on file  Other Topics Concern  . Not on file  Social History Narrative  . Not on file   Health Maintenance  Topic Date Due  . MAMMOGRAM  12/05/2018  . URINE MICROALBUMIN  11/16/2019  . PAP SMEAR-Modifier  11/15/2021  .  TETANUS/TDAP  10/29/2024  . COLONOSCOPY  04/06/2026  . INFLUENZA VACCINE  Completed  . Hepatitis C Screening  Completed  . HIV Screening  Completed    The following portions of the patient's history were reviewed and updated as appropriate: allergies, current medications, past family history, past medical history, past social history, past surgical history and problem list.  Review of Systems A comprehensive review of systems was negative.   Objective:    BP 133/78   Pulse 92   Temp 98.4 F (36.9 C) (Oral)   Ht 5\' 3"  (1.6 m)   Wt 184 lb (83.5 kg)   SpO2 100%   BMI 32.59 kg/m  General appearance: alert, cooperative, appears stated age and mildly obese Head: Normocephalic, without obvious abnormality, atraumatic Eyes: conjunctivae/corneas clear. PERRL, EOM's intact. Fundi benign. Ears: normal TM's and external ear canals both ears Nose: Nares normal. Septum midline. Mucosa normal. No drainage or sinus tenderness. Throat: lips, mucosa, and tongue normal; teeth and gums normal Neck: no adenopathy, no carotid bruit, no JVD, supple, symmetrical, trachea  midline and thyroid not enlarged, symmetric, no tenderness/mass/nodules Back: symmetric, no curvature. ROM normal. No CVA tenderness. Lungs: clear to auscultation bilaterally Heart: regular rate and rhythm, S1, S2 normal, no murmur, click, rub or gallop Abdomen: soft, non-tender; bowel sounds normal; no masses,  no organomegaly Pelvic: cervix normal in appearance, external genitalia normal, no adnexal masses or tenderness, no cervical motion tenderness, uterus normal size, shape, and consistency and vagina normal without discharge Extremities: extremities normal, atraumatic, no cyanosis or edema Pulses: 2+ and symmetric Skin: Skin color, texture, turgor normal. No rashes or lesions bruising scattered over body Lymph nodes: Cervical, supraclavicular, and axillary nodes normal. Neurologic: Alert and oriented X 3, normal strength and  tone. Normal symmetric reflexes. Normal coordination and gait    Assessment:    Healthy female exam.      Plan:    [name] Kitchen.[name] Kitchen.Raynelle FanningMona was seen today for follow-up and annual exam.  Diagnoses and all orders for this visit:  Routine physical examination -     Lipid Panel w/reflex Direct LDL -     COMPLETE METABOLIC PANEL WITH GFR -     CBC with Differential/Platelet -     Ferritin  Needs flu shot -     Flu Vaccine QUAD 36+ mos IM  Prediabetes -     POCT glycosylated hemoglobin (Hb A1C) -     POCT UA - Microalbumin  Screening for diabetes mellitus -     COMPLETE METABOLIC PANEL WITH GFR  Screening for lipid disorders -     Lipid Panel w/reflex Direct LDL  Iron deficiency anemia secondary to inadequate dietary iron intake -     CBC with Differential/Platelet -     Ferritin  Visit for screening mammogram -     MM 3D SCREEN BREAST BILATERAL  Papanicolaou smear -     Cytology - PAP  Exposure to Covid-19 Virus -     Novel Coronavirus, NAA ([facility])  Other orders -     Specimen status report   .[name] Kitchen. [facility] 2[facility] 11/16/2018 09/23/2018 06/30/2018 03/14/2018 11/24/2017  Decreased Interest 0 0 0 0 0  Down, Depressed, Hopeless 0 0 0 0 0  PHQ - 2 Score 0 0 0 0 0  Altered sleeping 0 - 0 0 0  Tired, decreased energy 0 - 0 - 0  Change in appetite 0 - 0 0 0  Feeling bad or failure about yourself  0 - 1 0 0  Trouble concentrating 0 - 0 0 0  Moving slowly or fidgety/restless 0 - 0 0 0  Suicidal thoughts 0 - 0 0 0  PHQ-9 Score 0 - 1 0 0  Difficult doing work/chores Not difficult at all - Not difficult at all Not difficult at all -   .. Lab Results  Component Value Date   HGBA1C 5.2 11/16/2018    Discussed 150 minutes of exercise a week.  Encouraged vitamin D 1000 units and Calcium 1300mg  or 4 servings of dairy a day.  Fasting labs ordered.  Mammogram ordered.  Colonoscopy UTD.  Pap done today.  Declined STD screening.  Flu shot given today.  Discussed shingrix. Wanted to  wait until later date.   Needed covid testing to go back to work. No symptoms or known exposure.  See After Visit Summary for Counseling Recommendations

## 2018-11-17 ENCOUNTER — Encounter: Payer: Self-pay | Admitting: Physician Assistant

## 2018-11-17 LAB — COMPLETE METABOLIC PANEL WITH GFR
AG Ratio: 2.1 (calc) (ref 1.0–2.5)
ALT: 23 U/L (ref 6–29)
AST: 22 U/L (ref 10–35)
Albumin: 4.4 g/dL (ref 3.6–5.1)
Alkaline phosphatase (APISO): 49 U/L (ref 37–153)
BUN: 12 mg/dL (ref 7–25)
CO2: 25 mmol/L (ref 20–32)
Calcium: 9.8 mg/dL (ref 8.6–10.4)
Chloride: 109 mmol/L (ref 98–110)
Creat: 0.92 mg/dL (ref 0.50–1.05)
GFR, Est African American: 80 mL/min/{1.73_m2} (ref >=60)
GFR, Est Non African American: 69 mL/min/{1.73_m2} (ref >=60)
Globulin: 2.1 g/dL (calc) (ref 1.9–3.7)
Glucose, Bld: 95 mg/dL (ref 65–99)
Potassium: 4.1 mmol/L (ref 3.5–5.3)
Sodium: 143 mmol/L (ref 135–146)
Total Bilirubin: 0.6 mg/dL (ref 0.2–1.2)
Total Protein: 6.5 g/dL (ref 6.1–8.1)

## 2018-11-17 LAB — CBC WITH DIFFERENTIAL/PLATELET
Absolute Monocytes: 389 cells/uL (ref 200–950)
Basophils Absolute: 49 cells/uL (ref 0–200)
Basophils Relative: 0.9 %
Eosinophils Absolute: 108 cells/uL (ref 15–500)
Eosinophils Relative: 2 %
HCT: 36.7 % (ref 35.0–45.0)
Hemoglobin: 12.4 g/dL (ref 11.7–15.5)
Lymphs Abs: 1825 cells/uL (ref 850–3900)
MCH: 33.3 pg — ABNORMAL HIGH (ref 27.0–33.0)
MCHC: 33.8 g/dL (ref 32.0–36.0)
MCV: 98.7 fL (ref 80.0–100.0)
MPV: 10.6 fL (ref 7.5–12.5)
Monocytes Relative: 7.2 %
Neutro Abs: 3029 cells/uL (ref 1500–7800)
Neutrophils Relative %: 56.1 %
Platelets: 284 10*3/uL (ref 140–400)
RBC: 3.72 10*6/uL — ABNORMAL LOW (ref 3.80–5.10)
RDW: 11.3 % (ref 11.0–15.0)
Total Lymphocyte: 33.8 %
WBC: 5.4 10*3/uL (ref 3.8–10.8)

## 2018-11-17 LAB — CYTOLOGY - PAP: Diagnosis: NEGATIVE

## 2018-11-17 LAB — LIPID PANEL W/REFLEX DIRECT LDL
Cholesterol: 115 mg/dL (ref <200)
HDL: 54 mg/dL (ref >=50)
LDL Cholesterol (Calc): 44 mg/dL (calc)
Non-HDL Cholesterol (Calc): 61 mg/dL (calc) (ref <130)
Total CHOL/HDL Ratio: 2.1 (calc) (ref <5.0)
Triglycerides: 91 mg/dL (ref <150)

## 2018-11-17 LAB — SPECIMEN STATUS REPORT

## 2018-11-17 LAB — NOVEL CORONAVIRUS, NAA: SARS-CoV-2, NAA: NOT DETECTED

## 2018-11-17 LAB — FERRITIN: Ferritin: 46 ng/mL (ref 16–232)

## 2018-11-17 NOTE — Progress Notes (Signed)
Bridget Johnston,   Cholesterol looks great. Kidney, liver, glucose look great. No anemia. Platelets look good. Iron stores look great.

## 2018-11-17 NOTE — Progress Notes (Signed)
Negative for COVID

## 2018-11-18 ENCOUNTER — Encounter: Payer: Self-pay | Admitting: Physician Assistant

## 2018-11-18 DIAGNOSIS — D508 Other iron deficiency anemias: Secondary | ICD-10-CM | POA: Insufficient documentation

## 2018-11-18 DIAGNOSIS — M797 Fibromyalgia: Secondary | ICD-10-CM

## 2018-11-18 MED ORDER — GABAPENTIN 300 MG PO CAPS: ORAL_CAPSULE | ORAL | 1 refills | Status: DC

## 2018-11-27 ENCOUNTER — Other Ambulatory Visit: Payer: Self-pay | Admitting: Physician Assistant

## 2018-11-27 DIAGNOSIS — F329 Major depressive disorder, single episode, unspecified: Secondary | ICD-10-CM

## 2018-11-27 DIAGNOSIS — F419 Anxiety disorder, unspecified: Secondary | ICD-10-CM

## 2018-11-27 DIAGNOSIS — E782 Mixed hyperlipidemia: Secondary | ICD-10-CM

## 2018-11-27 DIAGNOSIS — R4701 Aphasia: Secondary | ICD-10-CM

## 2018-11-27 DIAGNOSIS — R4789 Other speech disturbances: Secondary | ICD-10-CM

## 2018-12-08 ENCOUNTER — Encounter: Payer: Self-pay | Admitting: Physician Assistant

## 2018-12-08 ENCOUNTER — Other Ambulatory Visit: Payer: Self-pay

## 2018-12-08 ENCOUNTER — Ambulatory Visit (INDEPENDENT_AMBULATORY_CARE_PROVIDER_SITE_OTHER): Payer: 59

## 2018-12-08 DIAGNOSIS — Z1231 Encounter for screening mammogram for malignant neoplasm of breast: Secondary | ICD-10-CM

## 2018-12-08 NOTE — Telephone Encounter (Signed)
Form completed by provider. Called and advised patient ready to be picked up.   Copy scanned to document list. Copy sent to chart scan. Original copy up front for pt to pick up.

## 2018-12-09 NOTE — Progress Notes (Signed)
Normal mammogram follow up in 1 year.

## 2018-12-13 ENCOUNTER — Encounter: Payer: Self-pay | Admitting: Physician Assistant

## 2018-12-17 ENCOUNTER — Other Ambulatory Visit: Payer: Self-pay | Admitting: Physician Assistant

## 2018-12-17 DIAGNOSIS — M797 Fibromyalgia: Secondary | ICD-10-CM

## 2018-12-26 ENCOUNTER — Ambulatory Visit: Payer: 59 | Admitting: Physician Assistant

## 2019-01-02 ENCOUNTER — Other Ambulatory Visit: Payer: Self-pay | Admitting: Physician Assistant

## 2019-01-08 ENCOUNTER — Other Ambulatory Visit: Payer: Self-pay | Admitting: Physician Assistant

## 2019-01-08 DIAGNOSIS — R600 Localized edema: Secondary | ICD-10-CM

## 2019-01-08 DIAGNOSIS — E6609 Other obesity due to excess calories: Secondary | ICD-10-CM

## 2019-02-01 ENCOUNTER — Encounter: Payer: Self-pay | Admitting: Physician Assistant

## 2019-02-15 ENCOUNTER — Other Ambulatory Visit: Payer: Self-pay

## 2019-02-15 ENCOUNTER — Encounter: Payer: Self-pay | Admitting: Physician Assistant

## 2019-02-15 ENCOUNTER — Ambulatory Visit (INDEPENDENT_AMBULATORY_CARE_PROVIDER_SITE_OTHER): Payer: 59 | Admitting: Physician Assistant

## 2019-02-15 ENCOUNTER — Telehealth: Payer: Self-pay | Admitting: Physician Assistant

## 2019-02-15 DIAGNOSIS — J4531 Mild persistent asthma with (acute) exacerbation: Secondary | ICD-10-CM

## 2019-02-15 DIAGNOSIS — M797 Fibromyalgia: Secondary | ICD-10-CM | POA: Diagnosis not present

## 2019-02-15 DIAGNOSIS — R4789 Other speech disturbances: Secondary | ICD-10-CM

## 2019-02-15 DIAGNOSIS — E782 Mixed hyperlipidemia: Secondary | ICD-10-CM

## 2019-02-15 DIAGNOSIS — F419 Anxiety disorder, unspecified: Secondary | ICD-10-CM

## 2019-02-15 DIAGNOSIS — E6609 Other obesity due to excess calories: Secondary | ICD-10-CM | POA: Diagnosis not present

## 2019-02-15 DIAGNOSIS — R4701 Aphasia: Secondary | ICD-10-CM

## 2019-02-15 DIAGNOSIS — F329 Major depressive disorder, single episode, unspecified: Secondary | ICD-10-CM

## 2019-02-15 DIAGNOSIS — Z6833 Body mass index (BMI) 33.0-33.9, adult: Secondary | ICD-10-CM

## 2019-02-15 DIAGNOSIS — F32A Depression, unspecified: Secondary | ICD-10-CM

## 2019-02-15 MED ORDER — BUSPIRONE HCL 10 MG PO TABS: 10.0000 mg | ORAL_TABLET | Freq: Two times a day (BID) | ORAL | 1 refills | Status: DC

## 2019-02-15 MED ORDER — GABAPENTIN 300 MG PO CAPS: ORAL_CAPSULE | ORAL | 1 refills | Status: DC

## 2019-02-15 MED ORDER — ATORVASTATIN CALCIUM 40 MG PO TABS: ORAL_TABLET | ORAL | 1 refills | Status: DC

## 2019-02-15 MED ORDER — QSYMIA 15-92 MG PO CP24: 1.0000 | ORAL_CAPSULE | Freq: Every morning | ORAL | 1 refills | Status: DC

## 2019-02-15 MED ORDER — SYMBICORT 160-4.5 MCG/ACT IN AERO: 2.0000 | INHALATION_SPRAY | Freq: Two times a day (BID) | RESPIRATORY_TRACT | 12 refills | Status: DC | PRN

## 2019-02-15 MED ORDER — BUDESONIDE-FORMOTEROL FUMARATE 160-4.5 MCG/ACT IN AERO: 2.0000 | INHALATION_SPRAY | Freq: Two times a day (BID) | RESPIRATORY_TRACT | 5 refills | Status: DC

## 2019-02-15 NOTE — Telephone Encounter (Signed)
Received prior authorization on Viibryd sent through cover my meds and no auth is needed medication is on the preferred list. I notified the pharmacy and they got script filled with 0 dollar co pay. I notified patient to let her know. - CF

## 2019-02-15 NOTE — Addendum Note (Signed)
Addended byAnnamaria Helling on: 02/15/2019 01:20 PM   Modules accepted: Orders

## 2019-02-15 NOTE — Progress Notes (Signed)
..Virtual Visit via Video Note  I connected with Bridget Johnston on 02/15/19 at  7:30 AM EST by a video enabled telemedicine application and verified that I am speaking with the correct person using two identifiers.  Location: Patient: home Provider: clinic   I discussed the limitations of evaluation and management by telemedicine and the availability of in person appointments. The patient expressed understanding and agreed to proceed.  History of Present Illness: Pt is a 58 yo female with HTN, Asthma, anxiety/depression who calls into the clinic for medication refills.   She is doing great. She is losing weight with qsymia and diet changes. She feels great. No concerns or problems. Starting weight of 236 down to 169 today.   Pts mood is controlled. No SI/HC.   Pt breathing is normal with no problems. Not using albuterol.   .. Active Ambulatory Problems    Diagnosis Date Noted  . History of pyelonephritis 10/08/2010  . Opiate addiction ([place]) 01/03/2014  . Asthma 01/03/2014  . Hyperlipidemia 07/23/2014  . Hyperglycemia 07/23/2014  . Vitamin D deficiency 07/23/2014  . Elevated liver enzymes 07/23/2014  . Anxiety and depression 07/23/2014  . Fibromyalgia 07/23/2014  . Echocardiogram shows left ventricular diastolic dysfunction 83/15/1761  . Hypotension due to drugs 06/19/2016  . Aphasia 06/21/2016  . Essential hypertension 06/21/2016  . 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 Hyponatremia   . Encephalitis   . History of pneumonia 09/10/2016  . History of acute respiratory failure 09/10/2016  . Peripheral edema 09/10/2016  . Awareness alteration, transient 10/12/2016  . BMI 39.0-39.9,adult 11/03/2016  . Prediabetes 01/01/2017  . History of aphasia 01/01/2017  . Class 1 obesity due to excess calories without serious comorbidity with body mass index (BMI) of 33.0 to 33.9 in adult 01/01/2017  . Fatigue 01/01/2017  . Iron deficiency anemia secondary to inadequate dietary iron  intake 11/18/2018   Resolved Ambulatory Problems    Diagnosis Date Noted  . UTI 10/08/2010  . Acute respiratory failure with hypoxia ([place]) 06/19/2016  . History of ischemic stroke in prior three months 06/21/2016  . Diabetes mellitus type 2 in obese ([place])   . Morbid obesity ([place])   . Diabetes mellitus without complication ([place]) 60/73/7106   Past Medical History:  Diagnosis Date  . Arthritis   . Back pain   . Finger pain   . Hand pain   . High triglycerides   . Leg pain    Reviewed med, allergy, problem list.    Observations/Objective: No acute distress. Normal mood and appearance.  Normal breathing.   .. Today's Vitals   02/15/19 0709  BP: 118/88  Pulse: 91  Temp: (!) 97.3 F (36.3 C)  Weight: 169 lb (76.7 kg)   Body mass index is 29.94 kg/m.  .. Depression screen [facility] 2/9 02/15/2019 11/16/2018 09/23/2018 06/30/2018 03/14/2018  Decreased Interest 0 0 0 0 0  Down, Depressed, Hopeless 0 0 0 0 0  PHQ - 2 Score 0 0 0 0 0  Altered sleeping 0 0 - 0 0  Tired, decreased energy 0 0 - 0 -  Change in appetite 0 0 - 0 0  Feeling bad or failure about yourself  1 0 - 1 0  Trouble concentrating 0 0 - 0 0  Moving slowly or fidgety/restless 0 0 - 0 0  Suicidal thoughts 0 0 - 0 0  PHQ-9 Score 1 0 - 1 0  Difficult doing work/chores Not difficult at all Not difficult at  all - Not difficult at all Not difficult at all   .[name] Kitchen GAD 7 : Generalized Anxiety Score 11/16/2018 09/23/2018 06/30/2018 11/24/2017  Nervous, Anxious, on Edge 0 0 2 1  Control/stop worrying 0 0 0 2  Worry too much - different things 0 0 1 2  Trouble relaxing 0 0 0 0  Restless 0 0 0 0  Easily annoyed or irritable 0 0 0 0  Afraid - awful might happen 0 0 1 0  Total GAD 7 Score 0 0 4 5  Anxiety Difficulty Not difficult at all Not difficult at all Not difficult at all Not difficult at all       Assessment and Plan: [name] KitchenMarland KitchenMirca was seen today for weight check.  Diagnoses and all orders for this visit:  Class 1 obesity  due to excess calories without serious comorbidity with body mass index (BMI) of 33.0 to 33.9 in adult -     Phentermine-Topiramate (QSYMIA) 15-92 MG CP24; Take 1 capsule by mouth every morning.  Fibromyalgia -     gabapentin (NEURONTIN) 300 MG capsule; TAKE 1 CAPSULE(300 MG) BY MOUTH FOUR TIMES DAILY  Anxiety and depression -     busPIRone (BUSPAR) 10 MG tablet; Take 1 tablet (10 mg total) by mouth 2 (two) times daily.  Mild persistent asthma with acute exacerbation -     budesonide-formoterol (SYMBICORT) 160-4.5 MCG/ACT inhaler; Inhale 2 puffs into the lungs 2 (two) times daily.  Word finding difficulty -     atorvastatin (LIPITOR) 40 MG tablet; TAKE 1 TABLET(40 MG) BY MOUTH DAILY  Aphasia -     atorvastatin (LIPITOR) 40 MG tablet; TAKE 1 TABLET(40 MG) BY MOUTH DAILY  Mixed hyperlipidemia -     atorvastatin (LIPITOR) 40 MG tablet; TAKE 1 TABLET(40 MG) BY MOUTH DAILY   Labs UTD. Pt is doing great.  Medications refilled.  Follow up in 6 months.    Follow Up Instructions:    I discussed the assessment and treatment plan with the patient. The patient was provided an opportunity to ask questions and all were answered. The patient agreed with the plan and demonstrated an understanding of the instructions.   The patient was advised to call back or seek an in-person evaluation if the symptoms worsen or if the condition fails to improve as anticipated.    Tandy Gaw, PA-C

## 2019-02-28 ENCOUNTER — Encounter: Payer: Self-pay | Admitting: Physician Assistant

## 2019-03-07 ENCOUNTER — Other Ambulatory Visit: Payer: Self-pay | Admitting: Physician Assistant

## 2019-03-07 DIAGNOSIS — R600 Localized edema: Secondary | ICD-10-CM

## 2019-03-12 ENCOUNTER — Other Ambulatory Visit: Payer: Self-pay | Admitting: Physician Assistant

## 2019-03-12 DIAGNOSIS — F32A Depression, unspecified: Secondary | ICD-10-CM

## 2019-03-12 DIAGNOSIS — F329 Major depressive disorder, single episode, unspecified: Secondary | ICD-10-CM

## 2019-03-12 DIAGNOSIS — F419 Anxiety disorder, unspecified: Secondary | ICD-10-CM

## 2019-04-15 ENCOUNTER — Other Ambulatory Visit: Payer: Self-pay | Admitting: Physician Assistant

## 2019-04-15 DIAGNOSIS — Z6833 Body mass index (BMI) 33.0-33.9, adult: Secondary | ICD-10-CM

## 2019-04-15 DIAGNOSIS — E6609 Other obesity due to excess calories: Secondary | ICD-10-CM

## 2019-05-03 ENCOUNTER — Other Ambulatory Visit: Payer: Self-pay | Admitting: Physician Assistant

## 2019-05-03 DIAGNOSIS — R600 Localized edema: Secondary | ICD-10-CM

## 2019-05-20 ENCOUNTER — Encounter: Payer: Self-pay | Admitting: Physician Assistant

## 2019-05-22 NOTE — Telephone Encounter (Signed)
Ok for jury duty letter.

## 2019-05-23 NOTE — Telephone Encounter (Signed)
Emailed to pt.

## 2019-06-02 ENCOUNTER — Other Ambulatory Visit: Payer: Self-pay | Admitting: Neurology

## 2019-06-02 DIAGNOSIS — F329 Major depressive disorder, single episode, unspecified: Secondary | ICD-10-CM

## 2019-06-02 DIAGNOSIS — F419 Anxiety disorder, unspecified: Secondary | ICD-10-CM

## 2019-06-02 DIAGNOSIS — F32A Depression, unspecified: Secondary | ICD-10-CM

## 2019-06-02 MED ORDER — VIIBRYD 40 MG PO TABS: 40.0000 mg | ORAL_TABLET | Freq: Every day | ORAL | 0 refills | Status: DC

## 2019-06-06 ENCOUNTER — Other Ambulatory Visit: Payer: Self-pay | Admitting: Physician Assistant

## 2019-06-06 DIAGNOSIS — M797 Fibromyalgia: Secondary | ICD-10-CM

## 2019-06-29 ENCOUNTER — Other Ambulatory Visit: Payer: Self-pay | Admitting: Physician Assistant

## 2019-06-29 DIAGNOSIS — R600 Localized edema: Secondary | ICD-10-CM

## 2019-08-23 ENCOUNTER — Other Ambulatory Visit: Payer: Self-pay | Admitting: Physician Assistant

## 2019-08-23 DIAGNOSIS — R600 Localized edema: Secondary | ICD-10-CM

## 2019-08-24 ENCOUNTER — Other Ambulatory Visit: Payer: Self-pay | Admitting: Neurology

## 2019-08-24 ENCOUNTER — Encounter: Payer: Self-pay | Admitting: Physician Assistant

## 2019-08-24 DIAGNOSIS — R404 Transient alteration of awareness: Secondary | ICD-10-CM

## 2019-08-24 DIAGNOSIS — E782 Mixed hyperlipidemia: Secondary | ICD-10-CM

## 2019-08-24 DIAGNOSIS — R4789 Other speech disturbances: Secondary | ICD-10-CM

## 2019-08-24 DIAGNOSIS — R4701 Aphasia: Secondary | ICD-10-CM

## 2019-08-24 MED ORDER — ATORVASTATIN CALCIUM 40 MG PO TABS: ORAL_TABLET | ORAL | 0 refills | Status: DC

## 2019-08-24 MED ORDER — ZONISAMIDE 100 MG PO CAPS: ORAL_CAPSULE | ORAL | 5 refills | Status: DC

## 2019-08-28 ENCOUNTER — Other Ambulatory Visit: Payer: Self-pay | Admitting: Physician Assistant

## 2019-08-30 ENCOUNTER — Other Ambulatory Visit: Payer: Self-pay | Admitting: Physician Assistant

## 2019-08-30 DIAGNOSIS — F419 Anxiety disorder, unspecified: Secondary | ICD-10-CM

## 2019-09-15 ENCOUNTER — Ambulatory Visit (INDEPENDENT_AMBULATORY_CARE_PROVIDER_SITE_OTHER): Payer: 59

## 2019-09-15 ENCOUNTER — Ambulatory Visit (INDEPENDENT_AMBULATORY_CARE_PROVIDER_SITE_OTHER): Payer: 59 | Admitting: Physician Assistant

## 2019-09-15 ENCOUNTER — Other Ambulatory Visit: Payer: Self-pay

## 2019-09-15 VITALS — BP 130/80 | HR 92 | Ht 63.0 in | Wt 170.0 lb

## 2019-09-15 DIAGNOSIS — R4789 Other speech disturbances: Secondary | ICD-10-CM

## 2019-09-15 DIAGNOSIS — G8929 Other chronic pain: Secondary | ICD-10-CM

## 2019-09-15 DIAGNOSIS — J4520 Mild intermittent asthma, uncomplicated: Secondary | ICD-10-CM

## 2019-09-15 DIAGNOSIS — Z6833 Body mass index (BMI) 33.0-33.9, adult: Secondary | ICD-10-CM

## 2019-09-15 DIAGNOSIS — R404 Transient alteration of awareness: Secondary | ICD-10-CM

## 2019-09-15 DIAGNOSIS — M546 Pain in thoracic spine: Secondary | ICD-10-CM

## 2019-09-15 DIAGNOSIS — Z8781 Personal history of (healed) traumatic fracture: Secondary | ICD-10-CM

## 2019-09-15 DIAGNOSIS — M797 Fibromyalgia: Secondary | ICD-10-CM

## 2019-09-15 DIAGNOSIS — R4701 Aphasia: Secondary | ICD-10-CM

## 2019-09-15 DIAGNOSIS — F419 Anxiety disorder, unspecified: Secondary | ICD-10-CM

## 2019-09-15 DIAGNOSIS — R600 Localized edema: Secondary | ICD-10-CM

## 2019-09-15 DIAGNOSIS — Z780 Asymptomatic menopausal state: Secondary | ICD-10-CM

## 2019-09-15 DIAGNOSIS — Z13820 Encounter for screening for osteoporosis: Secondary | ICD-10-CM

## 2019-09-15 DIAGNOSIS — E6609 Other obesity due to excess calories: Secondary | ICD-10-CM

## 2019-09-15 DIAGNOSIS — E782 Mixed hyperlipidemia: Secondary | ICD-10-CM

## 2019-09-15 DIAGNOSIS — F329 Major depressive disorder, single episode, unspecified: Secondary | ICD-10-CM

## 2019-09-15 MED ORDER — ZONISAMIDE 100 MG PO CAPS: ORAL_CAPSULE | ORAL | 1 refills | Status: DC

## 2019-09-15 MED ORDER — VIIBRYD 40 MG PO TABS: 40.0000 mg | ORAL_TABLET | Freq: Every day | ORAL | 1 refills | Status: DC

## 2019-09-15 MED ORDER — GABAPENTIN 300 MG PO CAPS: ORAL_CAPSULE | ORAL | 1 refills | Status: DC

## 2019-09-15 MED ORDER — FUROSEMIDE 20 MG PO TABS: ORAL_TABLET | ORAL | 1 refills | Status: DC

## 2019-09-15 MED ORDER — ATORVASTATIN CALCIUM 40 MG PO TABS: ORAL_TABLET | ORAL | 1 refills | Status: DC

## 2019-09-15 MED ORDER — QSYMIA 15-92 MG PO CP24: 1.0000 | ORAL_CAPSULE | Freq: Every morning | ORAL | 1 refills | Status: DC

## 2019-09-15 MED ORDER — BUSPIRONE HCL 10 MG PO TABS: ORAL_TABLET | ORAL | 1 refills | Status: DC

## 2019-09-15 MED ORDER — BUDESONIDE-FORMOTEROL FUMARATE 160-4.5 MCG/ACT IN AERO: 2.0000 | INHALATION_SPRAY | Freq: Two times a day (BID) | RESPIRATORY_TRACT | 3 refills | Status: DC

## 2019-09-15 MED ORDER — ALBUTEROL SULFATE HFA 108 (90 BASE) MCG/ACT IN AERS: INHALATION_SPRAY | RESPIRATORY_TRACT | 1 refills | Status: DC

## 2019-09-15 NOTE — Patient Instructions (Signed)
Calcium carbonate 600mg  twice a day with vitamin D.  Get xray today.  Will order bone density with mammogram in October.

## 2019-09-15 NOTE — Progress Notes (Signed)
Subjective:    Patient ID: Bridget Johnston, female    DOB: October 10, 1960, 59 y.o.   MRN: [id number]  HPI  Pt is a 59 yo obese female with HTN, Asthma, fibromyalgia, anxiety, MDD who presents to the clinic for 6 month follow up.   She is doing great on Qsymia. She is down to 170lbs today. She was 236 originally. She feels great. No concerns or problems.   Pt is breathing great. No problems or concerns.   Pt's mood is great. No SI/HC.   Pt is having some sharp pain left posterior ribs mid back for last few weeks. It is not all the time but when she moves or twist. She is concerned. No cough or SOB. She does have asthma. No known injury. Not tried anything to make better.    .. Active Ambulatory Problems    Diagnosis Date Noted  . History of pyelonephritis 10/08/2010  . Opiate addiction ([facility]) 01/03/2014  . Asthma 01/03/2014  . Hyperlipidemia 07/23/2014  . Hyperglycemia 07/23/2014  . Vitamin D deficiency 07/23/2014  . Elevated liver enzymes 07/23/2014  . Anxiety and depression 07/23/2014  . Fibromyalgia 07/23/2014  . Echocardiogram shows left ventricular diastolic dysfunction 06/09/2016  . Hypotension due to drugs 06/19/2016  . Aphasia 06/21/2016  . Essential hypertension 06/21/2016  . 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 Hyponatremia   . Encephalitis   . History of pneumonia 09/10/2016  . History of acute respiratory failure 09/10/2016  . Peripheral edema 09/10/2016  . Awareness alteration, transient 10/12/2016  . BMI 39.0-39.9,adult 11/03/2016  . Prediabetes 01/01/2017  . History of aphasia 01/01/2017  . Class 1 obesity due to excess calories without serious comorbidity with body mass index (BMI) of 33.0 to 33.9 in adult 01/01/2017  . Fatigue 01/01/2017  . Iron deficiency anemia secondary to inadequate dietary iron intake 11/18/2018  . Word finding difficulty 09/18/2019  . History of fracture 09/18/2019  . Post-menopausal 09/18/2019  . Chronic left-sided thoracic  back pain 09/18/2019   Resolved Ambulatory Problems    Diagnosis Date Noted  . UTI 10/08/2010  . Acute respiratory failure with hypoxia ([facility]) 06/19/2016  . History of ischemic stroke in prior three months 06/21/2016  . Diabetes mellitus type 2 in obese ([facility])   . Morbid obesity ([facility])   . Diabetes mellitus without complication ([facility]) 09/03/2017   Past Medical History:  Diagnosis Date  . Arthritis   . Back pain   . Finger pain   . Hand pain   . High triglycerides   . Leg pain     Review of Systems See HPI.     Objective:   Physical Exam Vitals reviewed.  Constitutional:      Appearance: Normal appearance. She is obese.  HENT:     Head: Normocephalic.  Cardiovascular:     Rate and Rhythm: Normal rate and regular rhythm.     Pulses: Normal pulses.     Heart sounds: Normal heart sounds.  Pulmonary:     Effort: Pulmonary effort is normal.     Breath sounds: Normal breath sounds.     Comments: Tenderness over left posterior ribs to palpation.  Neurological:     General: No focal deficit present.     Mental Status: She is alert and oriented to person, place, and time.  Psychiatric:        Mood and Affect: Mood normal.           Assessment & Plan:  [name] KitchenMarland KitchenNiSource  was seen today for follow-up.  Diagnoses and all orders for this visit:  Chronic left-sided thoracic back pain -     DG Ribs Unilateral W/Chest Left  Mild intermittent asthma without complication -     albuterol (PROAIR HFA) 108 (90 Base) MCG/ACT inhaler; INHALE 2 PUFFS INTO THE LUNGS EVERY 4 HOURS AS NEEDED FOR WHEEZING OR SHORTNESS OF BREATH -     budesonide-formoterol (SYMBICORT) 160-4.5 MCG/ACT inhaler; Inhale 2 puffs into the lungs 2 (two) times daily.  Word finding difficulty -     atorvastatin (LIPITOR) 40 MG tablet; TAKE 1 TABLET(40 MG) BY MOUTH DAILY  Aphasia -     atorvastatin (LIPITOR) 40 MG tablet; TAKE 1 TABLET(40 MG) BY MOUTH DAILY -     zonisamide (ZONEGRAN) 100 MG capsule; TAKE 2 CAPSULES(200  MG) BY MOUTH AT BEDTIME  Mixed hyperlipidemia -     atorvastatin (LIPITOR) 40 MG tablet; TAKE 1 TABLET(40 MG) BY MOUTH DAILY  Anxiety and depression -     busPIRone (BUSPAR) 10 MG tablet; TAKE 1 TABLET(10 MG) BY MOUTH TWICE DAILY -     Vilazodone HCl (VIIBRYD) 40 MG TABS; Take 1 tablet (40 mg total) by mouth daily.  Bilateral leg edema -     furosemide (LASIX) 20 MG tablet; TAKE 1 TABLET BY MOUTH DAILY AS NEEDED FOR FLUID RETENTION OR SWELLING.  Fibromyalgia -     gabapentin (NEURONTIN) 300 MG capsule; TAKE 1 CAPSULE(300 MG) BY MOUTH FOUR TIMES DAILY  Class 1 obesity due to excess calories without serious comorbidity with body mass index (BMI) of 33.0 to 33.9 in adult -     Phentermine-Topiramate (QSYMIA) 15-92 MG CP24; Take 1 capsule by mouth every morning.  Awareness alteration, transient -     zonisamide (ZONEGRAN) 100 MG capsule; TAKE 2 CAPSULES(200 MG) BY MOUTH AT BEDTIME  Osteoporosis screening -     DG Bone Density  Post-menopausal -     DG Bone Density  History of fracture -     DG Bone Density    Medications refilled.  Xray ordered for ribs and chest. Likely more muscle. Discussed tens unit/icy hot/NSAId as needed.  Pt declined any vaccines.  Hx of fracture. Post-menopausal ordered early bone density with mammogram in October. Make sure taking calcium with vitamin D.

## 2019-09-18 ENCOUNTER — Encounter: Payer: Self-pay | Admitting: Physician Assistant

## 2019-09-18 DIAGNOSIS — Z8781 Personal history of (healed) traumatic fracture: Secondary | ICD-10-CM | POA: Insufficient documentation

## 2019-09-18 DIAGNOSIS — Z780 Asymptomatic menopausal state: Secondary | ICD-10-CM | POA: Insufficient documentation

## 2019-09-18 DIAGNOSIS — R4789 Other speech disturbances: Secondary | ICD-10-CM | POA: Insufficient documentation

## 2019-09-18 DIAGNOSIS — G8929 Other chronic pain: Secondary | ICD-10-CM | POA: Insufficient documentation

## 2019-09-18 DIAGNOSIS — M546 Pain in thoracic spine: Secondary | ICD-10-CM | POA: Insufficient documentation

## 2019-09-18 NOTE — Progress Notes (Signed)
Bridget Johnston,   Xray of ribs and chest normal! Treatment plan stays the same.

## 2019-09-20 ENCOUNTER — Other Ambulatory Visit: Payer: Self-pay | Admitting: Physician Assistant

## 2019-10-15 ENCOUNTER — Other Ambulatory Visit: Payer: Self-pay | Admitting: Physician Assistant

## 2019-10-15 DIAGNOSIS — J4520 Mild intermittent asthma, uncomplicated: Secondary | ICD-10-CM

## 2019-10-18 ENCOUNTER — Encounter: Payer: Self-pay | Admitting: Physician Assistant

## 2019-10-25 ENCOUNTER — Encounter: Payer: Self-pay | Admitting: Physician Assistant

## 2019-10-26 ENCOUNTER — Other Ambulatory Visit: Payer: Self-pay | Admitting: Physician Assistant

## 2019-10-26 DIAGNOSIS — F32A Depression, unspecified: Secondary | ICD-10-CM

## 2019-10-26 DIAGNOSIS — R4701 Aphasia: Secondary | ICD-10-CM

## 2019-10-26 DIAGNOSIS — F419 Anxiety disorder, unspecified: Secondary | ICD-10-CM

## 2019-10-26 DIAGNOSIS — F329 Major depressive disorder, single episode, unspecified: Secondary | ICD-10-CM

## 2019-10-26 DIAGNOSIS — E782 Mixed hyperlipidemia: Secondary | ICD-10-CM

## 2019-10-26 DIAGNOSIS — R4789 Other speech disturbances: Secondary | ICD-10-CM

## 2019-11-17 ENCOUNTER — Encounter: Payer: Self-pay | Admitting: Physician Assistant

## 2019-11-20 ENCOUNTER — Other Ambulatory Visit: Payer: Self-pay | Admitting: Neurology

## 2019-11-24 ENCOUNTER — Encounter: Payer: Self-pay | Admitting: Physician Assistant

## 2019-11-24 DIAGNOSIS — Z8781 Personal history of (healed) traumatic fracture: Secondary | ICD-10-CM

## 2019-11-24 DIAGNOSIS — I10 Essential (primary) hypertension: Secondary | ICD-10-CM

## 2019-11-24 DIAGNOSIS — Z780 Asymptomatic menopausal state: Secondary | ICD-10-CM

## 2019-11-24 DIAGNOSIS — D508 Other iron deficiency anemias: Secondary | ICD-10-CM

## 2019-11-24 DIAGNOSIS — Z131 Encounter for screening for diabetes mellitus: Secondary | ICD-10-CM

## 2019-11-24 DIAGNOSIS — M797 Fibromyalgia: Secondary | ICD-10-CM

## 2019-11-24 DIAGNOSIS — E782 Mixed hyperlipidemia: Secondary | ICD-10-CM

## 2019-11-24 DIAGNOSIS — Z13820 Encounter for screening for osteoporosis: Secondary | ICD-10-CM

## 2019-11-24 DIAGNOSIS — Z0000 Encounter for general adult medical examination without abnormal findings: Secondary | ICD-10-CM

## 2019-11-25 ENCOUNTER — Other Ambulatory Visit: Payer: Self-pay | Admitting: Physician Assistant

## 2019-11-25 DIAGNOSIS — M797 Fibromyalgia: Secondary | ICD-10-CM

## 2019-11-27 MED ORDER — OMEGA-3-ACID ETHYL ESTERS 1 G PO CAPS
2.0000 | ORAL_CAPSULE | Freq: Two times a day (BID) | ORAL | 0 refills | Status: DC
Start: 2019-11-27 — End: 2019-12-25

## 2019-11-28 NOTE — Addendum Note (Signed)
Addended bySilvio Pate on: 11/28/2019 10:12 AM   Modules accepted: Orders

## 2019-11-28 NOTE — Telephone Encounter (Signed)
Labs/bone density pended. Please review and sign.

## 2019-12-03 ENCOUNTER — Encounter: Payer: Self-pay | Admitting: Physician Assistant

## 2019-12-07 ENCOUNTER — Other Ambulatory Visit: Payer: Self-pay

## 2019-12-07 DIAGNOSIS — E782 Mixed hyperlipidemia: Secondary | ICD-10-CM

## 2019-12-07 DIAGNOSIS — R7303 Prediabetes: Secondary | ICD-10-CM

## 2019-12-07 DIAGNOSIS — Z0000 Encounter for general adult medical examination without abnormal findings: Secondary | ICD-10-CM

## 2019-12-07 DIAGNOSIS — I10 Essential (primary) hypertension: Secondary | ICD-10-CM

## 2019-12-07 NOTE — Progress Notes (Signed)
Lab orders

## 2019-12-08 LAB — CBC WITH DIFFERENTIAL/PLATELET
Absolute Monocytes: 348 cells/uL (ref 200–950)
Basophils Absolute: 71 cells/uL (ref 0–200)
Basophils Relative: 1.5 %
Eosinophils Absolute: 127 cells/uL (ref 15–500)
Eosinophils Relative: 2.7 %
HCT: 39.5 % (ref 35.0–45.0)
Hemoglobin: 13.4 g/dL (ref 11.7–15.5)
Lymphs Abs: 1542 cells/uL (ref 850–3900)
MCH: 32.7 pg (ref 27.0–33.0)
MCHC: 33.9 g/dL (ref 32.0–36.0)
MCV: 96.3 fL (ref 80.0–100.0)
MPV: 9.6 fL (ref 7.5–12.5)
Monocytes Relative: 7.4 %
Neutro Abs: 2613 cells/uL (ref 1500–7800)
Neutrophils Relative %: 55.6 %
Platelets: 277 10*3/uL (ref 140–400)
RBC: 4.1 10*6/uL (ref 3.80–5.10)
RDW: 11.4 % (ref 11.0–15.0)
Total Lymphocyte: 32.8 %
WBC: 4.7 10*3/uL (ref 3.8–10.8)

## 2019-12-08 LAB — COMPLETE METABOLIC PANEL WITH GFR
AG Ratio: 2.2 (calc) (ref 1.0–2.5)
ALT: 23 U/L (ref 6–29)
AST: 20 U/L (ref 10–35)
Albumin: 4.6 g/dL (ref 3.6–5.1)
Alkaline phosphatase (APISO): 62 U/L (ref 37–153)
BUN: 20 mg/dL (ref 7–25)
CO2: 26 mmol/L (ref 20–32)
Calcium: 9.7 mg/dL (ref 8.6–10.4)
Chloride: 107 mmol/L (ref 98–110)
Creat: 0.84 mg/dL (ref 0.50–1.05)
GFR, Est African American: 88 mL/min/{1.73_m2} (ref >=60)
GFR, Est Non African American: 76 mL/min/{1.73_m2} (ref >=60)
Globulin: 2.1 g/dL (calc) (ref 1.9–3.7)
Glucose, Bld: 103 mg/dL — ABNORMAL HIGH (ref 65–99)
Potassium: 4.1 mmol/L (ref 3.5–5.3)
Sodium: 141 mmol/L (ref 135–146)
Total Bilirubin: 0.7 mg/dL (ref 0.2–1.2)
Total Protein: 6.7 g/dL (ref 6.1–8.1)

## 2019-12-08 LAB — LIPID PANEL W/REFLEX DIRECT LDL
Cholesterol: 149 mg/dL (ref <200)
HDL: 77 mg/dL (ref >=50)
LDL Cholesterol (Calc): 58 mg/dL (calc)
Non-HDL Cholesterol (Calc): 72 mg/dL (calc) (ref <130)
Total CHOL/HDL Ratio: 1.9 (calc) (ref <5.0)
Triglycerides: 56 mg/dL (ref <150)

## 2019-12-08 LAB — HEMOGLOBIN A1C
Hgb A1c MFr Bld: 5.2 % of total Hgb (ref <5.7)
Mean Plasma Glucose: 103 (calc)
eAG (mmol/L): 5.7 (calc)

## 2019-12-08 NOTE — Progress Notes (Signed)
Fairy,   Cholesterol looks fantastic.  Kidney and liver look great.  A1C perfect.   Wonderful labs.

## 2019-12-11 ENCOUNTER — Other Ambulatory Visit: Payer: Self-pay | Admitting: Physician Assistant

## 2019-12-11 DIAGNOSIS — Z1231 Encounter for screening mammogram for malignant neoplasm of breast: Secondary | ICD-10-CM

## 2019-12-16 ENCOUNTER — Encounter: Payer: Self-pay | Admitting: Physician Assistant

## 2019-12-18 ENCOUNTER — Encounter: Payer: Self-pay | Admitting: Physician Assistant

## 2019-12-18 NOTE — Telephone Encounter (Signed)
Patient is a candidate for monoclonal antibody infusion.  Please see if she would be interested in place referral.

## 2019-12-19 ENCOUNTER — Telehealth (HOSPITAL_COMMUNITY): Payer: Self-pay

## 2019-12-19 ENCOUNTER — Ambulatory Visit (HOSPITAL_COMMUNITY)
Admission: RE | Admit: 2019-12-19 | Discharge: 2019-12-19 | Disposition: A | Discharge status: Home / Self Care | Payer: 59 | Source: Ambulatory Visit | Attending: Pulmonary Disease | Admitting: Pulmonary Disease

## 2019-12-19 ENCOUNTER — Other Ambulatory Visit: Payer: Self-pay | Admitting: Nurse Practitioner

## 2019-12-19 ENCOUNTER — Encounter: Payer: Self-pay | Admitting: Nurse Practitioner

## 2019-12-19 DIAGNOSIS — I10 Essential (primary) hypertension: Secondary | ICD-10-CM | POA: Diagnosis present

## 2019-12-19 DIAGNOSIS — Z6839 Body mass index (BMI) 39.0-39.9, adult: Secondary | ICD-10-CM | POA: Insufficient documentation

## 2019-12-19 DIAGNOSIS — B003 Herpesviral meningitis: Secondary | ICD-10-CM | POA: Diagnosis present

## 2019-12-19 DIAGNOSIS — R748 Abnormal levels of other serum enzymes: Secondary | ICD-10-CM | POA: Insufficient documentation

## 2019-12-19 DIAGNOSIS — I5189 Other ill-defined heart diseases: Secondary | ICD-10-CM | POA: Diagnosis present

## 2019-12-19 DIAGNOSIS — E782 Mixed hyperlipidemia: Secondary | ICD-10-CM | POA: Diagnosis present

## 2019-12-19 DIAGNOSIS — Z8701 Personal history of pneumonia (recurrent): Secondary | ICD-10-CM | POA: Diagnosis present

## 2019-12-19 DIAGNOSIS — Z8709 Personal history of other diseases of the respiratory system: Secondary | ICD-10-CM

## 2019-12-19 DIAGNOSIS — R7303 Prediabetes: Secondary | ICD-10-CM | POA: Diagnosis present

## 2019-12-19 DIAGNOSIS — G0490 Encephalitis and encephalomyelitis, unspecified: Secondary | ICD-10-CM | POA: Diagnosis present

## 2019-12-19 DIAGNOSIS — J4531 Mild persistent asthma with (acute) exacerbation: Secondary | ICD-10-CM | POA: Diagnosis present

## 2019-12-19 DIAGNOSIS — F1121 Opioid dependence, in remission: Secondary | ICD-10-CM

## 2019-12-19 DIAGNOSIS — U071 COVID-19: Secondary | ICD-10-CM

## 2019-12-19 MED ORDER — EPINEPHRINE 0.3 MG/0.3ML IJ SOAJ: 0.3000 mg | Freq: Once | INTRAMUSCULAR | Status: DC | PRN

## 2019-12-19 MED ORDER — SODIUM CHLORIDE 0.9 % IV SOLN: INTRAVENOUS | Status: DC | PRN

## 2019-12-19 MED ORDER — ALBUTEROL SULFATE HFA 108 (90 BASE) MCG/ACT IN AERS: 2.0000 | INHALATION_SPRAY | Freq: Once | RESPIRATORY_TRACT | Status: DC | PRN

## 2019-12-19 MED ORDER — SODIUM CHLORIDE 0.9 % IV SOLN
Freq: Once | INTRAVENOUS | Status: AC
  Filled 2019-12-19: qty 20

## 2019-12-19 MED ORDER — FAMOTIDINE IN NACL 20-0.9 MG/50ML-% IV SOLN: 20.0000 mg | Freq: Once | INTRAVENOUS | Status: DC | PRN

## 2019-12-19 MED ORDER — DIPHENHYDRAMINE HCL 50 MG/ML IJ SOLN: 50.0000 mg | Freq: Once | INTRAMUSCULAR | Status: DC | PRN

## 2019-12-19 NOTE — Discharge Instructions (Signed)

## 2019-12-19 NOTE — Telephone Encounter (Signed)
Called to Discuss with patient about Covid symptoms and the use of the monoclonal antibody infusion for those with mild to moderate Covid symptoms and at a high risk of hospitalization.     Pt appears to qualify for this infusion due to co-morbid conditions and/or a member of an at-risk group in accordance with the FDA Emergency Use Authorization.      

## 2019-12-19 NOTE — Progress Notes (Signed)
  Diagnosis: COVID-19  Physician:Dr Wright  Procedure: Covid Infusion Clinic Med: bamlanivimab\etesevimab infusion - Provided patient with bamlanimivab\etesevimab fact sheet for patients, parents and caregivers prior to infusion.  Complications: No immediate complications noted.  Discharge: Discharged home   Bridget Johnston 12/19/2019  

## 2019-12-19 NOTE — Progress Notes (Signed)
I connected by phone with Bridget Johnston on 12/19/2019 at 10:58 AM to discuss the potential use of a new treatment for mild to moderate COVID-19 viral infection in non-hospitalized patients.  This patient is a 59 y.o. female that meets the FDA criteria for Emergency Use Authorization of COVID monoclonal antibody casirivimab/imdevimab or bamlanivimab/eteseviamb.  Has a (+) direct SARS-CoV-2 viral test result  Has mild or moderate COVID-19   Is NOT hospitalized due to COVID-19  Is within 10 days of symptom onset  Has at least one of the high risk factor(s) for progression to severe COVID-19 and/or hospitalization as defined in EUA.  Specific high risk criteria : BMI > 25, Cardiovascular disease or hypertension and Chronic Lung Disease   Sx onset 10/8   I have spoken and communicated the following to the patient or parent/caregiver regarding COVID monoclonal antibody treatment:  1. FDA has authorized the emergency use for the treatment of mild to moderate COVID-19 in adults and pediatric patients with positive results of direct SARS-CoV-2 viral testing who are 60 years of age and older weighing at least 40 kg, and who are at high risk for progressing to severe COVID-19 and/or hospitalization.  2. The significant known and potential risks and benefits of COVID monoclonal antibody, and the extent to which such potential risks and benefits are unknown.  3. Information on available alternative treatments and the risks and benefits of those alternatives, including clinical trials.  4. Patients treated with COVID monoclonal antibody should continue to self-isolate and use infection control measures (e.g., wear mask, isolate, social distance, avoid sharing personal items, clean and disinfect "high touch" surfaces, and frequent handwashing) according to CDC guidelines.   5. The patient or parent/caregiver has the option to accept or refuse COVID monoclonal antibody treatment.  After reviewing  this information with the patient, the patient has agreed to receive one of the available covid 19 monoclonal antibodies and will be provided an appropriate fact sheet prior to infusion. Tollie Eth, NP 12/19/2019 10:58 AM

## 2019-12-25 ENCOUNTER — Encounter: Payer: Self-pay | Admitting: Physician Assistant

## 2019-12-25 ENCOUNTER — Other Ambulatory Visit: Payer: Self-pay | Admitting: Physician Assistant

## 2019-12-26 MED ORDER — METHYLPREDNISOLONE 4 MG PO TBPK: ORAL_TABLET | ORAL | 0 refills | Status: DC

## 2020-01-23 ENCOUNTER — Encounter: Payer: Self-pay | Admitting: Physician Assistant

## 2020-01-24 ENCOUNTER — Other Ambulatory Visit: Payer: Self-pay

## 2020-01-24 ENCOUNTER — Ambulatory Visit (INDEPENDENT_AMBULATORY_CARE_PROVIDER_SITE_OTHER): Payer: 59

## 2020-01-24 ENCOUNTER — Telehealth: Payer: Self-pay | Admitting: Physician Assistant

## 2020-01-24 ENCOUNTER — Other Ambulatory Visit: Payer: Self-pay | Admitting: Physician Assistant

## 2020-01-24 ENCOUNTER — Encounter: Payer: Self-pay | Admitting: Physician Assistant

## 2020-01-24 DIAGNOSIS — Z13820 Encounter for screening for osteoporosis: Secondary | ICD-10-CM | POA: Diagnosis not present

## 2020-01-24 DIAGNOSIS — M8580 Other specified disorders of bone density and structure, unspecified site: Secondary | ICD-10-CM | POA: Insufficient documentation

## 2020-01-24 DIAGNOSIS — Z1231 Encounter for screening mammogram for malignant neoplasm of breast: Secondary | ICD-10-CM

## 2020-01-24 DIAGNOSIS — R404 Transient alteration of awareness: Secondary | ICD-10-CM

## 2020-01-24 DIAGNOSIS — R4701 Aphasia: Secondary | ICD-10-CM

## 2020-01-24 NOTE — Telephone Encounter (Signed)
Forms completed. Patient made aware.

## 2020-01-24 NOTE — Telephone Encounter (Signed)
Patient left forms for her & her husband Bridget Johnston) to be filled out, stated they both have had their physicals and blood work done for the year or so she had thought, but if they needed to come in for anything else to have this paperwork filled out, just let them know. Paperwork placed in provider box. AM

## 2020-01-24 NOTE — Progress Notes (Signed)
Pt is osteopenic. Make sure taking vitamin D and calcium with regular exercise to prevent osteoporosis. Recheck in 2 year.

## 2020-01-25 ENCOUNTER — Encounter: Payer: Self-pay | Admitting: Physician Assistant

## 2020-01-29 NOTE — Progress Notes (Signed)
Bridget Johnston,   Normal mammogram. Follow up in 1 year.

## 2020-02-19 ENCOUNTER — Other Ambulatory Visit: Payer: Self-pay | Admitting: Physician Assistant

## 2020-02-19 DIAGNOSIS — J4520 Mild intermittent asthma, uncomplicated: Secondary | ICD-10-CM

## 2020-03-15 ENCOUNTER — Encounter: Payer: Self-pay | Admitting: Physician Assistant

## 2020-03-15 ENCOUNTER — Other Ambulatory Visit: Payer: Self-pay

## 2020-03-15 ENCOUNTER — Ambulatory Visit (INDEPENDENT_AMBULATORY_CARE_PROVIDER_SITE_OTHER): Payer: 59 | Admitting: Physician Assistant

## 2020-03-15 VITALS — BP 146/78 | HR 99 | Wt 182.1 lb

## 2020-03-15 DIAGNOSIS — F419 Anxiety disorder, unspecified: Secondary | ICD-10-CM | POA: Diagnosis not present

## 2020-03-15 DIAGNOSIS — E6609 Other obesity due to excess calories: Secondary | ICD-10-CM

## 2020-03-15 DIAGNOSIS — L650 Telogen effluvium: Secondary | ICD-10-CM

## 2020-03-15 DIAGNOSIS — R4701 Aphasia: Secondary | ICD-10-CM

## 2020-03-15 DIAGNOSIS — M797 Fibromyalgia: Secondary | ICD-10-CM

## 2020-03-15 DIAGNOSIS — Z6833 Body mass index (BMI) 33.0-33.9, adult: Secondary | ICD-10-CM

## 2020-03-15 DIAGNOSIS — Z638 Other specified problems related to primary support group: Secondary | ICD-10-CM

## 2020-03-15 DIAGNOSIS — R404 Transient alteration of awareness: Secondary | ICD-10-CM

## 2020-03-15 DIAGNOSIS — F32A Depression, unspecified: Secondary | ICD-10-CM

## 2020-03-15 DIAGNOSIS — R4789 Other speech disturbances: Secondary | ICD-10-CM

## 2020-03-15 DIAGNOSIS — E782 Mixed hyperlipidemia: Secondary | ICD-10-CM

## 2020-03-15 MED ORDER — BUSPIRONE HCL 10 MG PO TABS: ORAL_TABLET | ORAL | 1 refills | Status: DC

## 2020-03-15 MED ORDER — GABAPENTIN 300 MG PO CAPS: ORAL_CAPSULE | ORAL | 1 refills | Status: DC

## 2020-03-15 MED ORDER — WEGOVY 0.25 MG/0.5ML ~~LOC~~ SOAJ: 0.2500 mg | SUBCUTANEOUS | 0 refills | Status: DC

## 2020-03-15 MED ORDER — VIIBRYD 40 MG PO TABS: 40.0000 mg | ORAL_TABLET | Freq: Every day | ORAL | 1 refills | Status: DC

## 2020-03-15 MED ORDER — QSYMIA 15-92 MG PO CP24: 1.0000 | ORAL_CAPSULE | Freq: Every morning | ORAL | 1 refills | Status: DC

## 2020-03-15 MED ORDER — ZONISAMIDE 100 MG PO CAPS: ORAL_CAPSULE | ORAL | 1 refills | Status: DC

## 2020-03-15 MED ORDER — ATORVASTATIN CALCIUM 40 MG PO TABS: ORAL_TABLET | ORAL | 1 refills | Status: DC

## 2020-03-15 NOTE — Progress Notes (Signed)
Subjective:    Patient ID: Bridget Johnston, female    DOB: June 03, 1960, 60 y.o.   MRN: [id number]  HPI  Patient is a 60 year old female with anxiety and depression, fibromyalgia who presents to the clinic for medication refill and to discuss some recent issues.  She does want to mention her hair loss since COVID.  She wonders if there is anything we can do about this.  She is also under a lot of stress.  She is having to go through major changes in her family.  She was taking care of her granddaughter and her daughter decided to move out and now she does not see her granddaughter anymore.  She is having a lot of trouble with this transition.  Her granddaughter was her life.  She feels like she has nothing else to put her focus into.  She is also frustrated because she has done so well on her weight loss journey but has started to gain weight again.  She wonders if there is anything new other than Qsymia.  She has been on Qsymia for a while.  She does admit to stress eating.  .. 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    Hyponatremia    Encephalitis    History of pneumonia 09/10/2016   History of acute respiratory failure 09/10/2016   Peripheral edema 09/10/2016   Awareness alteration, transient 10/12/2016   BMI 39.0-39.9,adult 11/03/2016   Prediabetes 01/01/2017   History of aphasia 01/01/2017   Class 1 obesity due to excess calories without serious comorbidity  with body mass index (BMI) of 33.0 to 33.9 in adult 01/01/2017   Fatigue 01/01/2017   Iron deficiency anemia secondary to inadequate dietary iron intake 11/18/2018   Word finding difficulty 09/18/2019   History of fracture 09/18/2019   Post-menopausal 09/18/2019   Chronic left-sided thoracic back pain 09/18/2019   Osteopenia 01/24/2020   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     Objective:   Physical Exam Vitals reviewed.  Constitutional:      Appearance: Normal appearance.  HENT:     Head:     Comments: No evidence of patchy hairloss.  Cardiovascular:     Rate and Rhythm: Normal rate and regular rhythm.     Pulses: Normal pulses.     Heart sounds: Normal heart sounds.  Pulmonary:     Effort: Pulmonary  effort is normal.     Breath sounds: Normal breath sounds.  Musculoskeletal:     Right lower leg: No edema.     Left lower leg: No edema.  Neurological:     General: No focal deficit present.     Mental Status: She is alert and oriented to person, place, and time.  Psychiatric:        Mood and Affect: Mood normal.       .. Depression screen [facility] 2/9 03/18/2020 09/15/2019 02/15/2019 11/16/2018 09/23/2018  Decreased Interest 1 0 0 0 0  Down, Depressed, Hopeless 1 0 0 0 0  PHQ - 2 Score 2 0 0 0 0  Altered sleeping 0 0 0 0 -  Tired, decreased energy 1 0 0 0 -  Change in appetite 1 0 0 0 -  Feeling bad or failure about yourself  1 0 1 0 -   Trouble concentrating 1 0 0 0 -  Moving slowly or fidgety/restless 0 0 0 0 -  Suicidal thoughts 0 0 0 0 -  PHQ-9 Score 6 0 1 0 -  Difficult doing work/chores Somewhat difficult Not difficult at all Not difficult at all Not difficult at all -  Some recent data might be hidden   .[name] Kitchen GAD 7 : Generalized Anxiety Score 03/18/2020 09/15/2019 11/16/2018 09/23/2018  Nervous, Anxious, on Edge 1 1 0 0  Control/stop worrying 2 2 0 0  Worry too much - different things 2 2 0 0  Trouble relaxing 1 1 0 0  Restless 1 1 0 0  Easily annoyed or irritable 1 1 0 0  Afraid - awful might happen 0 0 0 0  Total GAD 7 Score 8 8 0 0  Anxiety Difficulty Very difficult Not difficult at all Not difficult at all Not difficult at all        Assessment & Plan:  [name] KitchenMarland KitchenDiagnoses and all orders for this visit:  Telogen effluvium  Class 1 obesity due to excess calories without serious comorbidity with body mass index (BMI) of 33.0 to 33.9 in adult -     Phentermine-Topiramate (QSYMIA) 15-92 MG CP24; Take 1 capsule by mouth every morning. -     Semaglutide-Weight Management (WEGOVY) 0.25 MG/0.5ML SOAJ; Inject 0.25 mg into the skin once a week.  Anxiety and depression -     busPIRone (BUSPAR) 10 MG tablet; TAKE 1 TABLET(10 MG) BY MOUTH three times a day. -     Vilazodone HCl (VIIBRYD) 40 MG TABS; Take 1 tablet (40 mg total) by mouth daily. -     Ambulatory referral to Psychology  Aphasia -     zonisamide (ZONEGRAN) 100 MG capsule; TAKE 2 CAPSULES(200 MG) BY MOUTH AT BEDTIME -     atorvastatin (LIPITOR) 40 MG tablet; TAKE 1 TABLET(40 MG) BY MOUTH DAILY  Awareness alteration, transient -     zonisamide (ZONEGRAN) 100 MG capsule; TAKE 2 CAPSULES(200 MG) BY MOUTH AT BEDTIME  Word finding difficulty -     atorvastatin (LIPITOR) 40 MG tablet; TAKE 1 TABLET(40 MG) BY MOUTH DAILY  Mixed hyperlipidemia -     atorvastatin (LIPITOR) 40 MG tablet; TAKE 1 TABLET(40 MG) BY MOUTH DAILY  Fibromyalgia -     gabapentin (NEURONTIN)  300 MG capsule; TAKE 1 CAPSULE(300 MG) BY MOUTH FOUR TIMES DAILY  Stress due to family tension -     Ambulatory referral to Psychology   PHQ and GAD numbers are up. Increased buspar to TID. Patient would benefit from  some counseling to help her cope with these life changes.  Discussed she is going to need to redefine her purpose since her granddaughter is growing up and not needing her as much.  Referral placed for [place] at [place].  Refilled ongoing maintenance medication.  Discussed hair loss from COVID.  Encourage biotin vitamins.  Discussed usually cyclical.  Also her increased stress overall could be contributing to this hair loss.  No worrisome signs on today's exam for hair loss.  [name] Kitchen.Discussed low carb diet with 1500 calories and 80g of protein.  Exercising at least 150 minutes a week.  [employer] could be a Microbiologist.  Patient would not be a candidate for Wellbutrin due to her history of seizures. For now she will continue on Qsymia. Discussed [id number] and printed this out.  She will see if her insurance covers [phone].  We could then make this switch. Discussed side effects of Wegovy and need for titration.  Follow up in 3 months if start this medication.

## 2020-03-15 NOTE — Patient Instructions (Addendum)
[facility]- weight loss program wegovy once weekly shot for weight loss

## 2020-03-16 ENCOUNTER — Other Ambulatory Visit: Payer: Self-pay | Admitting: Physician Assistant

## 2020-03-16 DIAGNOSIS — R600 Localized edema: Secondary | ICD-10-CM

## 2020-03-18 ENCOUNTER — Encounter: Payer: Self-pay | Admitting: Physician Assistant

## 2020-03-18 DIAGNOSIS — L650 Telogen effluvium: Secondary | ICD-10-CM | POA: Insufficient documentation

## 2020-03-18 DIAGNOSIS — Z638 Other specified problems related to primary support group: Secondary | ICD-10-CM | POA: Insufficient documentation

## 2020-03-21 ENCOUNTER — Other Ambulatory Visit: Payer: Self-pay | Admitting: Physician Assistant

## 2020-03-21 DIAGNOSIS — J4520 Mild intermittent asthma, uncomplicated: Secondary | ICD-10-CM

## 2020-04-16 ENCOUNTER — Other Ambulatory Visit: Payer: Self-pay | Admitting: Physician Assistant

## 2020-04-16 DIAGNOSIS — J4520 Mild intermittent asthma, uncomplicated: Secondary | ICD-10-CM

## 2020-04-30 ENCOUNTER — Other Ambulatory Visit: Payer: Self-pay | Admitting: Physician Assistant

## 2020-05-13 ENCOUNTER — Telehealth: Payer: Self-pay | Admitting: Physician Assistant

## 2020-05-13 NOTE — Telephone Encounter (Signed)
Called patient back and made her aware

## 2020-05-13 NOTE — Telephone Encounter (Signed)
Bridget Johnston    We had to rescheduled [facility] to 4/29 due to you were not going to be in office on 4/8 when she was originally scheduled and she wanted to make sure you would be able to refill her meds because she said she will probably run out before this appointment - CF

## 2020-05-13 NOTE — Telephone Encounter (Signed)
Yes when she get closer to needing request refills and ok to send before next appt!

## 2020-05-15 ENCOUNTER — Other Ambulatory Visit: Payer: Self-pay | Admitting: Physician Assistant

## 2020-05-15 DIAGNOSIS — E6609 Other obesity due to excess calories: Secondary | ICD-10-CM

## 2020-06-14 ENCOUNTER — Ambulatory Visit: Payer: 59 | Admitting: Physician Assistant

## 2020-06-25 ENCOUNTER — Emergency Department (INDEPENDENT_AMBULATORY_CARE_PROVIDER_SITE_OTHER)
Admission: EM | Admit: 2020-06-25 | Discharge: 2020-06-25 | Disposition: A | Discharge status: Home / Self Care | Payer: 59 | Source: Home / Self Care

## 2020-06-25 ENCOUNTER — Other Ambulatory Visit: Payer: Self-pay

## 2020-06-25 DIAGNOSIS — J069 Acute upper respiratory infection, unspecified: Secondary | ICD-10-CM

## 2020-06-25 MED ORDER — METHYLPREDNISOLONE 4 MG PO TBPK: ORAL_TABLET | ORAL | 0 refills | Status: DC

## 2020-06-25 MED ORDER — BENZONATATE 200 MG PO CAPS: 200.0000 mg | ORAL_CAPSULE | Freq: Two times a day (BID) | ORAL | 0 refills | Status: DC | PRN
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
## 2020-06-25 NOTE — Discharge Instructions (Signed)
Make sure you are drinking plenty of fluids Take the Medrol Dosepak as directed.  Take all of day 1 today (3 now and 3 at bedtime) Take Tessalon for cough.  You may take 2-3 times a day Follow-up with your usual primary care doctor  Can see your test results on MyChart
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
## 2020-06-26 LAB — COVID-19, FLU A+B AND RSV
Influenza A, NAA: NOT DETECTED
Influenza B, NAA: NOT DETECTED
RSV, NAA: DETECTED — AB
SARS-CoV-2, NAA: NOT DETECTED

## 2020-07-01 ENCOUNTER — Telehealth (INDEPENDENT_AMBULATORY_CARE_PROVIDER_SITE_OTHER): Payer: 59 | Admitting: Physician Assistant

## 2020-07-01 ENCOUNTER — Encounter: Payer: Self-pay | Admitting: Physician Assistant

## 2020-07-01 VITALS — Temp 98.7°F | Ht 62.0 in | Wt 183.0 lb

## 2020-07-01 DIAGNOSIS — J441 Chronic obstructive pulmonary disease with (acute) exacerbation: Secondary | ICD-10-CM

## 2020-07-01 DIAGNOSIS — B974 Respiratory syncytial virus as the cause of diseases classified elsewhere: Secondary | ICD-10-CM | POA: Diagnosis not present

## 2020-07-01 DIAGNOSIS — J4531 Mild persistent asthma with (acute) exacerbation: Secondary | ICD-10-CM | POA: Diagnosis not present

## 2020-07-01 MED ORDER — BENZONATATE 200 MG PO CAPS: 200.0000 mg | ORAL_CAPSULE | Freq: Two times a day (BID) | ORAL | 0 refills | Status: DC | PRN

## 2020-07-01 MED ORDER — AZITHROMYCIN 250 MG PO TABS: ORAL_TABLET | ORAL | 0 refills | Status: DC

## 2020-07-01 NOTE — Progress Notes (Signed)
Went to Urgent Care last week (started last Sunday) Given Methylprednisone - finished and no better  Coughing, can't get anything up Sinus congestion Chest pressure  Still using inhaler, tessalon  Can't sleep flat, sleeping in recliner
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
## 2020-07-05 ENCOUNTER — Ambulatory Visit: Payer: 59 | Admitting: Physician Assistant

## 2020-07-07 ENCOUNTER — Other Ambulatory Visit: Payer: Self-pay | Admitting: Physician Assistant

## 2020-07-13 ENCOUNTER — Other Ambulatory Visit: Payer: Self-pay | Admitting: Physician Assistant

## 2020-07-13 DIAGNOSIS — R4701 Aphasia: Secondary | ICD-10-CM

## 2020-07-13 DIAGNOSIS — J4520 Mild intermittent asthma, uncomplicated: Secondary | ICD-10-CM

## 2020-07-13 DIAGNOSIS — R404 Transient alteration of awareness: Secondary | ICD-10-CM

## 2020-08-11 ENCOUNTER — Other Ambulatory Visit: Payer: Self-pay | Admitting: Physician Assistant

## 2020-08-11 DIAGNOSIS — R4701 Aphasia: Secondary | ICD-10-CM

## 2020-08-11 DIAGNOSIS — R4789 Other speech disturbances: Secondary | ICD-10-CM

## 2020-08-11 DIAGNOSIS — E782 Mixed hyperlipidemia: Secondary | ICD-10-CM

## 2020-08-12 ENCOUNTER — Other Ambulatory Visit: Payer: Self-pay | Admitting: Physician Assistant

## 2020-08-12 DIAGNOSIS — R404 Transient alteration of awareness: Secondary | ICD-10-CM

## 2020-08-12 DIAGNOSIS — R4701 Aphasia: Secondary | ICD-10-CM

## 2020-09-10 ENCOUNTER — Other Ambulatory Visit: Payer: Self-pay | Admitting: Physician Assistant

## 2020-09-11 ENCOUNTER — Other Ambulatory Visit: Payer: Self-pay | Admitting: Physician Assistant

## 2020-09-11 DIAGNOSIS — R600 Localized edema: Secondary | ICD-10-CM

## 2020-09-11 DIAGNOSIS — F419 Anxiety disorder, unspecified: Secondary | ICD-10-CM

## 2020-09-16 ENCOUNTER — Encounter: Payer: Self-pay | Admitting: Physician Assistant

## 2020-09-16 DIAGNOSIS — F419 Anxiety disorder, unspecified: Secondary | ICD-10-CM

## 2020-09-16 DIAGNOSIS — F32A Depression, unspecified: Secondary | ICD-10-CM

## 2020-09-16 MED ORDER — VILAZODONE HCL 40 MG PO TABS: 40.0000 mg | ORAL_TABLET | Freq: Every day | ORAL | 1 refills | Status: DC

## 2020-09-17 ENCOUNTER — Telehealth: Payer: Self-pay | Admitting: Neurology

## 2020-09-17 NOTE — Telephone Encounter (Signed)
Prior Authorization for Viibryd submitted via covermymeds.    This request has been approved using information available on the patient's profile. CaseId:[id number];Status:Approved;Review Type:Prior Auth;Coverage Start Date:08/18/2020;Coverage End Date:09/17/2021;

## 2020-10-17 ENCOUNTER — Encounter: Payer: Self-pay | Admitting: Physician Assistant

## 2020-10-17 DIAGNOSIS — J4520 Mild intermittent asthma, uncomplicated: Secondary | ICD-10-CM

## 2020-10-17 DIAGNOSIS — R404 Transient alteration of awareness: Secondary | ICD-10-CM

## 2020-10-17 DIAGNOSIS — R4701 Aphasia: Secondary | ICD-10-CM

## 2020-10-17 DIAGNOSIS — F32A Depression, unspecified: Secondary | ICD-10-CM

## 2020-10-17 DIAGNOSIS — R600 Localized edema: Secondary | ICD-10-CM

## 2020-10-17 DIAGNOSIS — F419 Anxiety disorder, unspecified: Secondary | ICD-10-CM

## 2020-10-18 ENCOUNTER — Telehealth: Payer: Self-pay

## 2020-10-18 MED ORDER — BUDESONIDE-FORMOTEROL FUMARATE 160-4.5 MCG/ACT IN AERO: 2.0000 | INHALATION_SPRAY | Freq: Two times a day (BID) | RESPIRATORY_TRACT | 0 refills | Status: DC

## 2020-10-18 MED ORDER — BUSPIRONE HCL 10 MG PO TABS: ORAL_TABLET | ORAL | 0 refills | Status: DC

## 2020-10-18 MED ORDER — ZONISAMIDE 100 MG PO CAPS: ORAL_CAPSULE | ORAL | 0 refills | Status: DC

## 2020-10-18 MED ORDER — FUROSEMIDE 20 MG PO TABS: ORAL_TABLET | ORAL | 0 refills | Status: DC

## 2020-10-18 NOTE — Telephone Encounter (Signed)
Pt was last given #90 to take 3 times daily with note that pt needs OV.  Please review for appropriateness of refill.  Message sent to front desk to call pt for appt.  I refilled her other requested medications for 30 days only.  Tiajuana Amass, CMA

## 2020-10-18 NOTE — Telephone Encounter (Signed)
Please call pt to schedule appt.  No further refills until pt is seen.  Pt was supposed to have had a f/u in April.  Tiajuana Amass, CMA

## 2020-10-18 NOTE — Telephone Encounter (Signed)
To covering provider

## 2020-10-18 NOTE — Telephone Encounter (Signed)
Pt is scheduled for the 26th

## 2020-10-23 ENCOUNTER — Encounter: Payer: Self-pay | Admitting: Physician Assistant

## 2020-10-23 ENCOUNTER — Telehealth (INDEPENDENT_AMBULATORY_CARE_PROVIDER_SITE_OTHER): Payer: 59 | Admitting: Physician Assistant

## 2020-10-23 ENCOUNTER — Telehealth: Payer: Self-pay

## 2020-10-23 VITALS — BP 130/77 | HR 94 | Temp 97.8°F | Ht 62.0 in | Wt 198.0 lb

## 2020-10-23 DIAGNOSIS — U071 COVID-19: Secondary | ICD-10-CM | POA: Diagnosis not present

## 2020-10-23 MED ORDER — MOLNUPIRAVIR EUA 200MG CAPSULE: 4.0000 | ORAL_CAPSULE | Freq: Two times a day (BID) | ORAL | 0 refills | Status: AC

## 2020-10-23 MED ORDER — BENZONATATE 200 MG PO CAPS: 200.0000 mg | ORAL_CAPSULE | Freq: Two times a day (BID) | ORAL | 0 refills | Status: DC | PRN

## 2020-10-23 NOTE — Telephone Encounter (Signed)
Pt scheduled for 3:20pm today for virtual visit per [facility].  Tiajuana Amass, CMA

## 2020-10-23 NOTE — Progress Notes (Signed)
..Virtual Visit via Video Note  I connected with Bridget Johnston on 10/23/20 at  3:20 PM EDT by a video enabled telemedicine application and verified that I am speaking with the correct person using two identifiers.  Location: Patient: home Provider: clinic  .[name] KitchenParticipating in visit:  Patient: Bridget Johnston Provider: Tandy Gaw PA-C    I discussed the limitations of evaluation and management by telemedicine and the availability of in person appointments. The patient expressed understanding and agreed to proceed.  History of Present Illness: Pt is a 60 yo female who presented to work this morning for her twice a week covid screening and tested positive. She has a dry cough and some PND. No fever, chills, body aches, SOB. Not taking any medication. Not had covid vaccine but did have covid last year.    .. 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    Hyponatremia    Encephalitis    History of pneumonia 09/10/2016   History of acute respiratory failure 09/10/2016   Peripheral edema 09/10/2016   Awareness alteration, transient 10/12/2016   BMI 39.0-39.9,adult 11/03/2016   Prediabetes 01/01/2017   History of aphasia 01/01/2017   Class 1 obesity due to excess calories without serious comorbidity with body mass index (BMI) of 33.0 to 33.9 in adult 01/01/2017   Fatigue 01/01/2017   Iron deficiency anemia secondary to inadequate dietary iron intake 11/18/2018   Word finding difficulty 09/18/2019    History of fracture 09/18/2019   Post-menopausal 09/18/2019   Chronic left-sided thoracic back pain 09/18/2019   Osteopenia 01/24/2020   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   Stress due to family tension 03/18/2020   Resolved Ambulatory Problems    Diagnosis Date Noted   UTI 10/08/2010   Acute respiratory failure with hypoxia (HCC) 06/19/2016   History of ischemic stroke in prior three months 06/21/2016   Diabetes mellitus type 2 in obese [facility])    Morbid obesity (HCC)    Diabetes mellitus without complication (HCC) 09/03/2017   Past Medical History:  Diagnosis Date   Arthritis    Back pain    Finger pain    Hand pain    High triglycerides    Leg pain     Observations/Objective: No acute distress  Normal breathing Normal mood and appearance Dry cough  .[name] Kitchen Today's Vitals   10/23/20 1503  BP: 130/77  Pulse: 94  Temp: 97.8 F (36.6 C)  TempSrc: Oral  Weight: 198 lb (89.8 kg)  Height: 5\' 2"  (1.575 m)   Body mass index is 36.21 kg/m.     Assessment and Plan: [name] KitchenRenatha was seen today for covid positive.  Diagnoses and all orders for this visit:  COVID-19 virus infection -     molnupiravir EUA 200 mg CAPS; Take 4 capsules (800 mg total) by mouth 2 (two) times daily for 5 days. -     benzonatate (TESSALON) 200 MG capsule; Take 1 capsule (200 mg total) by mouth 2 (two) times daily as needed for cough.  Positive covid test.  Not had  any vaccines. Hx of covid.  Written out of work until 8/21.  Discussed symptomatic care with tylenol cold sinus severe, flonase, vitamin C, zinc.  Started molnupivir based on current medication list, day 1 of symptoms, high risk. Discussed side effects.  Continue with good deep breathing.  Discussed red flag and when to seek more care.     Follow Up Instructions:    I discussed the assessment and treatment plan with the patient. The patient was provided an opportunity to ask questions and all were answered. The patient agreed with the  plan and demonstrated an understanding of the instructions.   The patient was advised to call back or seek an in-person evaluation if the symptoms worsen or if the condition fails to improve as anticipated.    Tandy Gaw, PA-C

## 2020-10-23 NOTE — Progress Notes (Signed)
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

## 2020-10-24 ENCOUNTER — Telehealth: Payer: 59 | Admitting: Family Medicine

## 2020-11-01 ENCOUNTER — Other Ambulatory Visit: Payer: Self-pay

## 2020-11-01 ENCOUNTER — Encounter: Payer: Self-pay | Admitting: Physician Assistant

## 2020-11-01 ENCOUNTER — Ambulatory Visit (INDEPENDENT_AMBULATORY_CARE_PROVIDER_SITE_OTHER): Payer: 59 | Admitting: Physician Assistant

## 2020-11-01 VITALS — BP 136/65 | HR 99 | Ht 62.0 in | Wt 203.2 lb

## 2020-11-01 DIAGNOSIS — M797 Fibromyalgia: Secondary | ICD-10-CM | POA: Diagnosis not present

## 2020-11-01 DIAGNOSIS — F32A Depression, unspecified: Secondary | ICD-10-CM

## 2020-11-01 DIAGNOSIS — J4520 Mild intermittent asthma, uncomplicated: Secondary | ICD-10-CM

## 2020-11-01 DIAGNOSIS — R7303 Prediabetes: Secondary | ICD-10-CM | POA: Diagnosis not present

## 2020-11-01 DIAGNOSIS — Z6833 Body mass index (BMI) 33.0-33.9, adult: Secondary | ICD-10-CM

## 2020-11-01 DIAGNOSIS — E782 Mixed hyperlipidemia: Secondary | ICD-10-CM | POA: Diagnosis not present

## 2020-11-01 DIAGNOSIS — R4701 Aphasia: Secondary | ICD-10-CM

## 2020-11-01 DIAGNOSIS — F419 Anxiety disorder, unspecified: Secondary | ICD-10-CM

## 2020-11-01 DIAGNOSIS — I10 Essential (primary) hypertension: Secondary | ICD-10-CM | POA: Diagnosis not present

## 2020-11-01 DIAGNOSIS — Z780 Asymptomatic menopausal state: Secondary | ICD-10-CM

## 2020-11-01 DIAGNOSIS — R404 Transient alteration of awareness: Secondary | ICD-10-CM

## 2020-11-01 DIAGNOSIS — Z1329 Encounter for screening for other suspected endocrine disorder: Secondary | ICD-10-CM

## 2020-11-01 DIAGNOSIS — E6609 Other obesity due to excess calories: Secondary | ICD-10-CM

## 2020-11-01 DIAGNOSIS — Z79899 Other long term (current) drug therapy: Secondary | ICD-10-CM

## 2020-11-01 MED ORDER — ZONISAMIDE 100 MG PO CAPS: ORAL_CAPSULE | ORAL | 1 refills | Status: DC

## 2020-11-01 MED ORDER — GABAPENTIN 300 MG PO CAPS: ORAL_CAPSULE | ORAL | 1 refills | Status: DC

## 2020-11-01 MED ORDER — VILAZODONE HCL 40 MG PO TABS: 40.0000 mg | ORAL_TABLET | Freq: Every day | ORAL | 1 refills | Status: DC

## 2020-11-01 MED ORDER — QSYMIA 15-92 MG PO CP24: 1.0000 | ORAL_CAPSULE | Freq: Every morning | ORAL | 1 refills | Status: DC

## 2020-11-01 MED ORDER — PLENITY PO CAPS: 3.0000 | ORAL_CAPSULE | Freq: Two times a day (BID) | ORAL | 5 refills | Status: DC

## 2020-11-01 MED ORDER — BUDESONIDE-FORMOTEROL FUMARATE 160-4.5 MCG/ACT IN AERO: 2.0000 | INHALATION_SPRAY | Freq: Two times a day (BID) | RESPIRATORY_TRACT | 5 refills | Status: DC

## 2020-11-01 MED ORDER — BUSPIRONE HCL 10 MG PO TABS: ORAL_TABLET | ORAL | 1 refills | Status: DC

## 2020-11-01 MED ORDER — ALBUTEROL SULFATE HFA 108 (90 BASE) MCG/ACT IN AERS: INHALATION_SPRAY | RESPIRATORY_TRACT | 1 refills | Status: DC

## 2020-11-01 NOTE — Progress Notes (Signed)
yg 

## 2020-11-01 NOTE — Progress Notes (Signed)
Subjective:    Patient ID: Bridget Johnston, female    DOB: January 18, 1961, 60 y.o.   MRN: [id number]  HPI Pt is a 60 yo obese female with HTN, Asthma, fibromyalgia, HLD, anxiety and depression who present to clinic for medication refills.   Pt just recovered from covid infection and doing well. No problems or concerns.   She is doing really good on current medications. Denies any CP, palpitations, headaches or vision changes. She is frustrated with her weight. She feels like qsymia not working as well and stopped but then realized it was helping. She wants to get back on it.   Mood is great. No SI/HC.   [name] Kitchen. 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    Hyponatremia    Encephalitis    History of pneumonia 09/10/2016   History of acute respiratory failure 09/10/2016   Peripheral edema 09/10/2016   Awareness alteration, transient 10/12/2016   BMI 39.0-39.9,adult 11/03/2016   Prediabetes 01/01/2017   History of aphasia 01/01/2017   Class 1 obesity due to excess calories without serious comorbidity with body mass index (BMI) of 33.0 to 33.9 in adult 01/01/2017   Fatigue 01/01/2017   Iron deficiency anemia secondary to inadequate dietary iron intake 11/18/2018   Word finding difficulty 09/18/2019   History of fracture 09/18/2019   Post-menopausal 09/18/2019   Chronic left-sided thoracic back pain 09/18/2019   Osteopenia 01/24/2020   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   Stress due to family  tension 03/18/2020   Resolved Ambulatory Problems    Diagnosis Date Noted   UTI 10/08/2010   Acute respiratory failure with hypoxia (HCC) 06/19/2016   History of ischemic stroke in prior three months 06/21/2016   Diabetes mellitus type 2 in obese [facility])    Morbid obesity (HCC)    Diabetes mellitus without complication (HCC) 09/03/2017   Past Medical History:  Diagnosis Date   Arthritis    Back pain    Finger pain    Hand pain    High triglycerides    Leg pain         Review of Systems See HPI.     Objective:   Physical Exam Vitals reviewed.  Constitutional:      Appearance: Normal appearance. She is obese.  HENT:     Head: Normocephalic.  Cardiovascular:     Rate and Rhythm: Normal rate and regular rhythm.     Pulses: Normal pulses.     Heart sounds: Normal heart sounds.  Pulmonary:     Effort: Pulmonary effort is normal.     Breath sounds: Normal breath sounds.  Musculoskeletal:     Right lower leg: No edema.     Left lower leg: No edema.  Neurological:     General: No focal deficit present.     Mental Status: She is alert and oriented to person, place, and time.  Psychiatric:        Mood and Affect: Mood normal.         .. Depression screen PHQ  2/9 11/01/2020 03/18/2020 09/15/2019 02/15/2019 11/16/2018  Decreased Interest 0 1 0 0 0  Down, Depressed, Hopeless 0 1 0 0 0  PHQ - 2 Score 0 2 0 0 0  Altered sleeping 0 0 0 0 0  Tired, decreased energy 0 1 0 0 0  Change in appetite 1 1 0 0 0  Feeling bad or failure about yourself  0 1 0 1 0  Trouble concentrating 0 1 0 0 0  Moving slowly or fidgety/restless 0 0 0 0 0  Suicidal thoughts 0 0 0 0 0  PHQ-9 Score 1 6 0 1 0  Difficult doing work/chores Not difficult at all Somewhat difficult Not difficult at all Not difficult at all Not difficult at all  Some recent data might be hidden   .[name] Kitchen GAD 7 : Generalized Anxiety Score 11/01/2020 03/18/2020 09/15/2019 11/16/2018  Nervous, Anxious, on Edge 0 1 1 0  Control/stop  worrying 0 2 2 0  Worry too much - different things 0 2 2 0  Trouble relaxing 0 1 1 0  Restless 0 1 1 0  Easily annoyed or irritable 0 1 1 0  Afraid - awful might happen 0 0 0 0  Total GAD 7 Score 0 8 8 0  Anxiety Difficulty - Very difficult Not difficult at all Not difficult at all     Assessment & Plan:  [name] KitchenMarland KitchenSya was seen today for medication refill.  Diagnoses and all orders for this visit:  Fibromyalgia -     gabapentin (NEURONTIN) 300 MG capsule; TAKE 1 CAPSULE(300 MG) BY MOUTH FOUR TIMES DAILY -     Fe+TIBC+Fer -     VITAMIN D 25 Hydroxy (Vit-D Deficiency, Fractures) -     B12 and Folate Panel  Mixed hyperlipidemia -     Lipid Panel w/reflex Direct LDL  Essential hypertension -     COMPLETE METABOLIC PANEL WITH GFR  Prediabetes -     COMPLETE METABOLIC PANEL WITH GFR -     Hemoglobin A1c  Thyroid disorder screen -     TSH  Medication management -     TSH -     Lipid Panel w/reflex Direct LDL -     COMPLETE METABOLIC PANEL WITH GFR -     CBC with Differential/Platelet -     Fe+TIBC+Fer -     VITAMIN D 25 Hydroxy (Vit-D Deficiency, Fractures) -     B12 and Folate Panel  Mild intermittent asthma without complication -     albuterol (PROAIR HFA) 108 (90 Base) MCG/ACT inhaler; INHALE 2 PUFFS INTO THE LUNGS EVERY 4 HOURS AS NEEDED FOR WHEEZING OR SHORTNESS OF BREATH -     budesonide-formoterol (SYMBICORT) 160-4.5 MCG/ACT inhaler; Inhale 2 puffs into the lungs 2 (two) times daily.  Anxiety and depression -     busPIRone (BUSPAR) 10 MG tablet; TAKE 1 TABLET(10 MG) BY MOUTH THREE TIMES DAILY -     Vilazodone HCl (VIIBRYD) 40 MG TABS; Take 1 tablet (40 mg total) by mouth daily.  Class 1 obesity due to excess calories without serious comorbidity with body mass index (BMI) of 33.0 to 33.9 in adult -     Carboxymeth-Cellulose-CitricAc (PLENITY) CAPS; Take 3 capsules by mouth 2 (two) times daily before a meal. Take with 16oz of water before meals. -      Phentermine-Topiramate (QSYMIA) 15-92 MG CP24; Take 1 capsule by mouth every morning.  Aphasia -     zonisamide (ZONEGRAN) 100 MG capsule; Take  2 capsules by mouth at bedtime.  Awareness alteration, transient -     zonisamide (ZONEGRAN) 100 MG capsule; Take 2 capsules by mouth at bedtime.  Post-menopausal -     VITAMIN D 25 Hydroxy (Vit-D Deficiency, Fractures) -     B12 and Folate Panel  GAD/PHQ looks great.  Vitals look good.  Refilled medications.  Screening labs ordered.   [name] Kitchen.Discussed low carb diet with 1500 calories and 80g of protein.  Exercising at least 150 minutes a week.  [employer] could be a Chief Technology Officer.  Added qsymia to restart.  Added plenity.   Follow up in 6 months.   Declines vaccines.

## 2020-11-09 LAB — COMPLETE METABOLIC PANEL WITH GFR
AG Ratio: 1.9 (calc) (ref 1.0–2.5)
ALT: 28 U/L (ref 6–29)
AST: 27 U/L (ref 10–35)
Albumin: 4.5 g/dL (ref 3.6–5.1)
Alkaline phosphatase (APISO): 70 U/L (ref 37–153)
BUN: 13 mg/dL (ref 7–25)
CO2: 26 mmol/L (ref 20–32)
Calcium: 9.8 mg/dL (ref 8.6–10.4)
Chloride: 106 mmol/L (ref 98–110)
Creat: 0.75 mg/dL (ref 0.50–1.05)
Globulin: 2.4 g/dL (calc) (ref 1.9–3.7)
Glucose, Bld: 96 mg/dL (ref 65–99)
Potassium: 4 mmol/L (ref 3.5–5.3)
Sodium: 140 mmol/L (ref 135–146)
Total Bilirubin: 0.5 mg/dL (ref 0.2–1.2)
Total Protein: 6.9 g/dL (ref 6.1–8.1)
eGFR: 91 mL/min/{1.73_m2} (ref >=60)

## 2020-11-09 LAB — B12 AND FOLATE PANEL
Folate: >24 ng/mL
Vitamin B-12: 682 pg/mL (ref 200–1100)

## 2020-11-09 LAB — CBC WITH DIFFERENTIAL/PLATELET
Absolute Monocytes: 371 cells/uL (ref 200–950)
Basophils Absolute: 32 cells/uL (ref 0–200)
Basophils Relative: 0.6 %
Eosinophils Absolute: 133 cells/uL (ref 15–500)
Eosinophils Relative: 2.5 %
HCT: 40 % (ref 35.0–45.0)
Hemoglobin: 13.4 g/dL (ref 11.7–15.5)
Lymphs Abs: 2051 cells/uL (ref 850–3900)
MCH: 32.7 pg (ref 27.0–33.0)
MCHC: 33.5 g/dL (ref 32.0–36.0)
MCV: 97.6 fL (ref 80.0–100.0)
MPV: 9.5 fL (ref 7.5–12.5)
Monocytes Relative: 7 %
Neutro Abs: 2714 cells/uL (ref 1500–7800)
Neutrophils Relative %: 51.2 %
Platelets: 344 10*3/uL (ref 140–400)
RBC: 4.1 10*6/uL (ref 3.80–5.10)
RDW: 11.8 % (ref 11.0–15.0)
Total Lymphocyte: 38.7 %
WBC: 5.3 10*3/uL (ref 3.8–10.8)

## 2020-11-09 LAB — LIPID PANEL W/REFLEX DIRECT LDL
Cholesterol: 166 mg/dL (ref <200)
HDL: 73 mg/dL (ref >=50)
LDL Cholesterol (Calc): 74 mg/dL (calc)
Non-HDL Cholesterol (Calc): 93 mg/dL (calc) (ref <130)
Total CHOL/HDL Ratio: 2.3 (calc) (ref <5.0)
Triglycerides: 103 mg/dL (ref <150)

## 2020-11-09 LAB — HEMOGLOBIN A1C
Hgb A1c MFr Bld: 5.2 % of total Hgb (ref <5.7)
Mean Plasma Glucose: 103 mg/dL
eAG (mmol/L): 5.7 mmol/L

## 2020-11-09 LAB — IRON,TIBC AND FERRITIN PANEL
%SAT: 42 % (calc) (ref 16–45)
Ferritin: 16 ng/mL (ref 16–232)
Iron: 164 ug/dL — ABNORMAL HIGH (ref 45–160)
TIBC: 388 mcg/dL (calc) (ref 250–450)

## 2020-11-09 LAB — TSH: TSH: 1.05 mIU/L (ref 0.40–4.50)

## 2020-11-09 LAB — VITAMIN D 25 HYDROXY (VIT D DEFICIENCY, FRACTURES): Vit D, 25-Hydroxy: 25 ng/mL — ABNORMAL LOW (ref 30–100)

## 2020-11-10 ENCOUNTER — Other Ambulatory Visit: Payer: Self-pay | Admitting: Physician Assistant

## 2020-11-10 DIAGNOSIS — R600 Localized edema: Secondary | ICD-10-CM

## 2020-11-12 ENCOUNTER — Encounter: Payer: Self-pay | Admitting: Physician Assistant

## 2020-11-12 NOTE — Progress Notes (Signed)
Bridget Johnston,   Normal TSH.  Cholesterol looks fantastic.  Kidney, liver, glucose look great.  A1C stable and good.  Iron level just a little elevated. I would cut out any extra iron supplement. Recheck in 6 months.  Vitamin D still low. How much are you taking? I would like to increase.  B12 perfect.

## 2020-11-18 ENCOUNTER — Encounter: Payer: Self-pay | Admitting: Physician Assistant

## 2020-12-09 ENCOUNTER — Other Ambulatory Visit: Payer: Self-pay | Admitting: Physician Assistant

## 2020-12-09 DIAGNOSIS — J4520 Mild intermittent asthma, uncomplicated: Secondary | ICD-10-CM

## 2021-01-04 ENCOUNTER — Encounter: Payer: Self-pay | Admitting: Emergency Medicine

## 2021-01-04 ENCOUNTER — Other Ambulatory Visit: Payer: Self-pay

## 2021-01-04 ENCOUNTER — Emergency Department
Admission: EM | Admit: 2021-01-04 | Discharge: 2021-01-04 | Disposition: A | Discharge status: Home / Self Care | Payer: 59 | Source: Home / Self Care

## 2021-01-04 DIAGNOSIS — J069 Acute upper respiratory infection, unspecified: Secondary | ICD-10-CM

## 2021-01-04 MED ORDER — BENZONATATE 200 MG PO CAPS: 200.0000 mg | ORAL_CAPSULE | Freq: Two times a day (BID) | ORAL | 0 refills | Status: DC | PRN

## 2021-01-04 MED ORDER — METHYLPREDNISOLONE 4 MG PO TBPK: ORAL_TABLET | ORAL | 0 refills | Status: DC

## 2021-01-04 NOTE — Discharge Instructions (Signed)
Continue to drink lots of water Run a humidifier if you have 1 Continue the Mucinex DM 2 times a day Add Tessalon 200 mg 2 times a day Taking Medrol pack (methylprednisolone) as directed.  Take all of day 1 today Check MyChart for your test results

## 2021-01-04 NOTE — ED Provider Notes (Signed)
[facility]    CSN: [id number] Arrival date & time: 01/04/21  0851      History   Chief Complaint Chief Complaint  Patient presents with   Cough    HPI Bridget Johnston is a 60 y.o. female.   HPI  Patient states that she was at a funeral for family a week ago.  On Monday she developed symptoms of upper respiratory infection.  She has had a productive cough for the last 5 days.  She states she cannot lay down because of the coughing.  She states that her throat is sore, slightly, and voice is hoarse from all the coughing.  States she is coughing up "thick dark sputum".  Is worried that she has RSV again.  She has not had a flu vaccine.  She has not had COVID-vaccine.  She was exposed to a number of people, unknown specific infection.  No fever or chills.  Past Medical History:  Diagnosis Date   Arthritis    Asthma    Back pain    Diabetes mellitus without complication (HCC)    Finger pain    Hand pain    High triglycerides    Leg pain     Patient Active Problem List   Diagnosis Date Noted   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   Stress due to family tension 03/18/2020   Osteopenia 01/24/2020   Word finding difficulty 09/18/2019   History of fracture 09/18/2019   Post-menopausal 09/18/2019   Chronic left-sided thoracic back pain 09/18/2019   Iron deficiency anemia secondary to inadequate dietary iron intake 11/18/2018   Prediabetes 01/01/2017   History of aphasia 01/01/2017   Class 1 obesity due to excess calories without serious comorbidity with body mass index (BMI) of 33.0 to 33.9 in adult 01/01/2017   Fatigue 01/01/2017   BMI 39.0-39.9,adult 11/03/2016   Awareness alteration, transient 10/12/2016   History of pneumonia 09/10/2016   History of acute respiratory failure 09/10/2016   Peripheral edema 09/10/2016   Encephalitis    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    Hyponatremia    Aphasia 06/21/2016   Essential hypertension 06/21/2016   Hypotension  due to drugs 06/19/2016   Echocardiogram shows left ventricular diastolic dysfunction 17/51/0258   Hyperlipidemia 07/23/2014   Hyperglycemia 07/23/2014   Vitamin D deficiency 07/23/2014   Elevated liver enzymes 07/23/2014   Anxiety and depression 07/23/2014   Fibromyalgia 07/23/2014   Opiate addiction ([place]) 01/03/2014   Asthma 01/03/2014   History of pyelonephritis 10/08/2010    Past Surgical History:  Procedure Laterality Date   ABLATION  2007   [place]      OB History     Gravida  2   Para  2   Term  2   Preterm      AB      Living  2      SAB      IAB      Ectopic      Multiple      Live Births               Home Medications    Prior to Admission medications   Medication Sig Start Date End Date Taking? Authorizing Provider  5-Methyltetrahydrofolate (METHYL FOLATE) POWD    Yes [provider]  albuterol (VENTOLIN HFA) 108 (90 Base) MCG/ACT inhaler INHALE 2 PUFFS INTO THE LUNGS EVERY  4 HOURS AS NEEDED FOR WHEEZING OR SHORTNESS OF BREATH 12/09/20  Yes Breeback, Jade L, PA-C  AMBULATORY NON FORMULARY MEDICATION Take 1 tablet by mouth daily. Patient reports taking wheat grass to help control her glucose.   Yes [provider]  atorvastatin (LIPITOR) 40 MG tablet TAKE 1 TABLET(40 MG) BY MOUTH DAILY 08/12/20  Yes Breeback, Jade L, PA-C  benzonatate (TESSALON) 200 MG capsule Take 1 capsule (200 mg total) by mouth 2 (two) times daily as needed for cough. 01/04/21  Yes Raylene Everts, MD  Blood Glucose Monitoring Suppl (ONE TOUCH ULTRA MINI) w/Device KIT Check fasting blood sugar daily. 08/22/18  Yes Breeback, Jade L, PA-C  budesonide-formoterol (SYMBICORT) 160-4.5 MCG/ACT inhaler Inhale 2 puffs into the lungs 2 (two) times daily. 11/01/20  Yes Breeback, Jade L, PA-C  busPIRone (BUSPAR) 10 MG tablet TAKE 1 TABLET(10 MG) BY MOUTH THREE TIMES DAILY 11/01/20  Yes Breeback, Jade L, PA-C   Carboxymeth-Cellulose-CitricAc (PLENITY) CAPS Take 3 capsules by mouth 2 (two) times daily before a meal. Take with 16oz of water before meals. 11/01/20  Yes Breeback, Jade L, PA-C  cholecalciferol (VITAMIN D) 1000 units tablet Take 2 tablets (2,000 Units total) by mouth daily. 09/03/17  Yes Breeback, Jade L, PA-C  Emollient (COLLAGEN EX)    Yes [provider]  furosemide (LASIX) 20 MG tablet TAKE 1 TABLET BY MOUTH DAILY AS NEEDED FOR FLUID RETENTION OR SWELLING 11/12/20  Yes Breeback, Jade L, PA-C  gabapentin (NEURONTIN) 300 MG capsule TAKE 1 CAPSULE(300 MG) BY MOUTH FOUR TIMES DAILY 11/01/20  Yes Breeback, Jade L, PA-C  ipratropium (ATROVENT) 0.06 % nasal spray USE 1 SPRAY IN EACH NOSTRIL FOUR TIMES DAILY AS NEEDED 01/20/18  Yes Breeback, Jade L, PA-C  Lancets (ONETOUCH DELICA PLUS [id number]) MISC USE TO CHECK BLOOD SUGAR DAILY 09/10/20  Yes Breeback, Jade L, PA-C  Loratadine 10 MG CAPS    Yes [provider]  Magnesium 500 MG CAPS    Yes [provider]  methylPREDNISolone (MEDROL DOSEPAK) 4 MG TBPK tablet tad 01/04/21  Yes Raylene Everts, MD  Multiple Vitamin (MULTI-VITAMIN DAILY PO)    Yes [provider]  omega-3 acid ethyl esters (LOVAZA) 1 g capsule TAKE 2 CAPSULES BY MOUTH TWICE DAILY 07/09/20  Yes Breeback, Jade L, PA-C  ONETOUCH VERIO test strip USE TO TEST BLOOD SUGAR DAILY 04/30/20  Yes Breeback, Jade L, PA-C  Phentermine-Topiramate (QSYMIA) 15-92 MG CP24 Take 1 capsule by mouth every morning. 11/01/20  Yes Breeback, Jade L, PA-C  Vilazodone HCl (VIIBRYD) 40 MG TABS Take 1 tablet (40 mg total) by mouth daily. 11/01/20  Yes Breeback, Jade L, PA-C  Zinc Sulfate (ZINC 15 PO)    Yes [provider]  zonisamide (ZONEGRAN) 100 MG capsule Take 2 capsules by mouth at bedtime. 11/01/20  Yes Breeback, Royetta Car, PA-C    Family History Family History  Problem Relation Age of Onset   Heart disease Mother    Hypertension Mother    Heart disease Father     Diabetes Father    Hypertension Father    Arthritis Other    Diabetes Other    Cancer - Colon Maternal Grandfather     Social History Social History   Tobacco Use   Smoking status: Former    Packs/day: 1.00    Years: 17.00    Pack years: 17.00    Types: Cigarettes, E-cigarettes    Quit date: 01/06/2012    Years since quitting: 9.0  Smokeless tobacco: Never  Substance Use Topics   Alcohol use: No    Alcohol/week: 0.0 standard drinks   Drug use: No    Comment: opiod adduction     Allergies   Adhesive [tape] and Prednisone   Review of Systems Review of Systems See HPI  Physical Exam Triage Vital Signs ED Triage Vitals [01/04/21 0905]  Enc Vitals Group     BP 122/80     Pulse Rate (!) 118     Resp 20     Temp 98.8 F (37.1 C)     Temp Source Oral     SpO2 97 %     Weight 203 lb (92.1 kg)     Height 5' 2"  (1.575 m)     Head Circumference      Peak Flow      Pain Score 0     Pain Loc      Pain Edu?      Excl. in [place]?    No data found.  Updated Vital Signs BP 122/80 (BP Location: Right Arm)   Pulse (!) 118   Temp 98.8 F (37.1 C) (Oral)   Ht 5' 2"  (1.575 m)   Wt 92.1 kg   SpO2 97%   BMI 37.13 kg/m     Physical Exam Constitutional:      General: She is not in acute distress.    Appearance: She is well-developed.     Comments: Hoarse.  Overweight.  No acute respiratory distress  HENT:     Head: Normocephalic and atraumatic.     Right Ear: Tympanic membrane and ear canal normal.     Left Ear: Tympanic membrane and ear canal normal.     Nose: Congestion present.     Mouth/Throat:     Pharynx: Posterior oropharyngeal erythema present.  Eyes:     Conjunctiva/sclera: Conjunctivae normal.     Pupils: Pupils are equal, round, and reactive to light.  Cardiovascular:     Rate and Rhythm: Regular rhythm. Tachycardia present.     Heart sounds: Normal heart sounds.  Pulmonary:     Effort: Pulmonary effort is normal. No respiratory distress.      Breath sounds: Wheezing and rhonchi present.     Comments: Coarse rhonchi, scattered wheeze throughout Abdominal:     General: There is no distension.     Palpations: Abdomen is soft.  Musculoskeletal:        General: Normal range of motion.     Cervical back: Normal range of motion.  Lymphadenopathy:     Cervical: No cervical adenopathy.  Skin:    General: Skin is warm and dry.  Neurological:     Mental Status: She is alert.  Psychiatric:        Mood and Affect: Mood normal.        Behavior: Behavior normal.     UC Treatments / Results  Labs (all labs ordered are listed, but only abnormal results are displayed) Labs Reviewed  COVID-19, FLU A+B AND RSV    EKG   Radiology No results found.  Procedures Procedures (including critical care time)  Medications Ordered in UC Medications - No data to display  Initial Impression / Assessment and Plan / UC Course  I have reviewed the triage vital signs and the nursing notes.  Pertinent labs & imaging results that were available during my care of the patient were reviewed by me and considered in my medical decision making (see chart for details).  I considered doing a rapid flu to see if she needed Tamiflu, but she is outside the 5-day window.  WillSend viral swab to test for COVID, influenza, and RSV.  Symptomatic care.  See primary care if not improving by next week  Final Clinical Impressions(s) / UC Diagnoses   Final diagnoses:  Viral URI with cough     Discharge Instructions      Continue to drink lots of water Run a humidifier if you have 1 Continue the Mucinex DM 2 times a day Add Tessalon 200 mg 2 times a day Taking Medrol pack (methylprednisolone) as directed.  Take all of day 1 today Check MyChart for your test results   ED Prescriptions     Medication Sig Dispense Auth. Provider   methylPREDNISolone (MEDROL DOSEPAK) 4 MG TBPK tablet tad 21 tablet Raylene Everts, MD   benzonatate (TESSALON)  200 MG capsule Take 1 capsule (200 mg total) by mouth 2 (two) times daily as needed for cough. 20 capsule Raylene Everts, MD      PDMP not reviewed this encounter.   Raylene Everts, MD 01/04/21 [phone]

## 2021-01-04 NOTE — ED Triage Notes (Signed)
Patient c/o productive cough x 4 days, unable to lay down due to coughing.  Patient has tried Mucinex, Robitussin and Nyquil.  Does have a concern for possible RSV.  Patient is not vaccinated for COVID.

## 2021-01-05 LAB — COVID-19, FLU A+B AND RSV
Influenza A, NAA: NOT DETECTED
Influenza B, NAA: NOT DETECTED
RSV, NAA: NOT DETECTED
SARS-CoV-2, NAA: NOT DETECTED

## 2021-01-09 ENCOUNTER — Other Ambulatory Visit: Payer: Self-pay | Admitting: Neurology

## 2021-01-09 MED ORDER — OMEGA-3-ACID ETHYL ESTERS 1 G PO CAPS: 2.0000 | ORAL_CAPSULE | Freq: Two times a day (BID) | ORAL | 2 refills | Status: DC

## 2021-01-28 ENCOUNTER — Encounter: Payer: Self-pay | Admitting: Physician Assistant

## 2021-02-02 ENCOUNTER — Other Ambulatory Visit: Payer: Self-pay | Admitting: Physician Assistant

## 2021-02-02 ENCOUNTER — Encounter: Payer: Self-pay | Admitting: Physician Assistant

## 2021-02-02 DIAGNOSIS — J4520 Mild intermittent asthma, uncomplicated: Secondary | ICD-10-CM

## 2021-02-08 ENCOUNTER — Other Ambulatory Visit: Payer: Self-pay | Admitting: Physician Assistant

## 2021-02-08 DIAGNOSIS — R600 Localized edema: Secondary | ICD-10-CM

## 2021-02-10 ENCOUNTER — Other Ambulatory Visit: Payer: Self-pay

## 2021-02-10 DIAGNOSIS — R4789 Other speech disturbances: Secondary | ICD-10-CM

## 2021-02-10 DIAGNOSIS — R4701 Aphasia: Secondary | ICD-10-CM

## 2021-02-10 DIAGNOSIS — E782 Mixed hyperlipidemia: Secondary | ICD-10-CM

## 2021-02-10 MED ORDER — ATORVASTATIN CALCIUM 40 MG PO TABS: 40.0000 mg | ORAL_TABLET | Freq: Every day | ORAL | 0 refills | Status: DC

## 2021-02-15 ENCOUNTER — Encounter: Payer: Self-pay | Admitting: Physician Assistant

## 2021-02-15 DIAGNOSIS — F32A Depression, unspecified: Secondary | ICD-10-CM

## 2021-02-17 MED ORDER — VILAZODONE HCL 40 MG PO TABS: 40.0000 mg | ORAL_TABLET | Freq: Every day | ORAL | 1 refills | Status: DC

## 2021-03-10 ENCOUNTER — Other Ambulatory Visit: Payer: Self-pay | Admitting: Physician Assistant

## 2021-03-10 DIAGNOSIS — R4789 Other speech disturbances: Secondary | ICD-10-CM

## 2021-03-10 DIAGNOSIS — E782 Mixed hyperlipidemia: Secondary | ICD-10-CM

## 2021-03-10 DIAGNOSIS — R4701 Aphasia: Secondary | ICD-10-CM

## 2021-03-12 ENCOUNTER — Other Ambulatory Visit: Payer: Self-pay

## 2021-03-12 DIAGNOSIS — R4701 Aphasia: Secondary | ICD-10-CM

## 2021-03-12 DIAGNOSIS — E782 Mixed hyperlipidemia: Secondary | ICD-10-CM

## 2021-03-12 DIAGNOSIS — R4789 Other speech disturbances: Secondary | ICD-10-CM

## 2021-03-14 ENCOUNTER — Encounter: Payer: Self-pay | Admitting: Physician Assistant

## 2021-03-14 DIAGNOSIS — R790 Abnormal level of blood mineral: Secondary | ICD-10-CM

## 2021-03-14 DIAGNOSIS — E559 Vitamin D deficiency, unspecified: Secondary | ICD-10-CM

## 2021-03-14 NOTE — Telephone Encounter (Signed)
Which labs do you want at this time?  Lab result note said to recheck in 6 months, but MyChart message says 3 months.  Please verify exactly which labs you want at this time.  Tiajuana Amass, CMA

## 2021-03-21 ENCOUNTER — Other Ambulatory Visit: Payer: Self-pay | Admitting: Physician Assistant

## 2021-03-21 DIAGNOSIS — Z1231 Encounter for screening mammogram for malignant neoplasm of breast: Secondary | ICD-10-CM

## 2021-03-22 LAB — CBC WITH DIFFERENTIAL/PLATELET
Absolute Monocytes: 443 cells/uL (ref 200–950)
Basophils Absolute: 49 cells/uL (ref 0–200)
Basophils Relative: 0.9 %
Eosinophils Absolute: 162 cells/uL (ref 15–500)
Eosinophils Relative: 3 %
HCT: 37.9 % (ref 35.0–45.0)
Hemoglobin: 12.9 g/dL (ref 11.7–15.5)
Lymphs Abs: 2155 cells/uL (ref 850–3900)
MCH: 33.1 pg — ABNORMAL HIGH (ref 27.0–33.0)
MCHC: 34 g/dL (ref 32.0–36.0)
MCV: 97.2 fL (ref 80.0–100.0)
MPV: 9.8 fL (ref 7.5–12.5)
Monocytes Relative: 8.2 %
Neutro Abs: 2592 cells/uL (ref 1500–7800)
Neutrophils Relative %: 48 %
Platelets: 293 10*3/uL (ref 140–400)
RBC: 3.9 10*6/uL (ref 3.80–5.10)
RDW: 11.5 % (ref 11.0–15.0)
Total Lymphocyte: 39.9 %
WBC: 5.4 10*3/uL (ref 3.8–10.8)

## 2021-03-22 LAB — IRON: Iron: 104 ug/dL (ref 45–160)

## 2021-03-22 LAB — VITAMIN D 25 HYDROXY (VIT D DEFICIENCY, FRACTURES): Vit D, 25-Hydroxy: 24 ng/mL — ABNORMAL LOW (ref 30–100)

## 2021-03-22 LAB — FERRITIN: Ferritin: 15 ng/mL — ABNORMAL LOW (ref 16–288)

## 2021-03-23 ENCOUNTER — Other Ambulatory Visit: Payer: Self-pay | Admitting: Physician Assistant

## 2021-03-23 DIAGNOSIS — J4520 Mild intermittent asthma, uncomplicated: Secondary | ICD-10-CM

## 2021-03-25 ENCOUNTER — Encounter: Payer: Self-pay | Admitting: Physician Assistant

## 2021-03-25 ENCOUNTER — Other Ambulatory Visit: Payer: Self-pay | Admitting: Physician Assistant

## 2021-03-25 NOTE — Progress Notes (Signed)
Desani,   Iron looks great. Iron stores a little low. Keep with same iron regimen.  Vitamin D is still low. Are you taking with protein/fat?

## 2021-03-27 ENCOUNTER — Ambulatory Visit: Payer: 59

## 2021-03-28 MED ORDER — VITAMIN D (ERGOCALCIFEROL) 1.25 MG (50000 UNIT) PO CAPS: 50000.0000 [IU] | ORAL_CAPSULE | ORAL | 1 refills | Status: DC

## 2021-03-28 NOTE — Addendum Note (Signed)
Addended by: Jomarie Longs on: 03/28/2021 04:50 PM   Modules accepted: Orders

## 2021-04-03 ENCOUNTER — Other Ambulatory Visit: Payer: Self-pay

## 2021-04-03 ENCOUNTER — Ambulatory Visit (INDEPENDENT_AMBULATORY_CARE_PROVIDER_SITE_OTHER): Payer: 59

## 2021-04-03 DIAGNOSIS — Z1231 Encounter for screening mammogram for malignant neoplasm of breast: Secondary | ICD-10-CM | POA: Diagnosis not present

## 2021-04-04 NOTE — Progress Notes (Signed)
Normal mammogram. Follow up in 1 year.

## 2021-05-02 ENCOUNTER — Other Ambulatory Visit: Payer: Self-pay | Admitting: Neurology

## 2021-05-02 MED ORDER — ONETOUCH VERIO VI STRP: ORAL_STRIP | 1 refills | Status: DC

## 2021-05-07 ENCOUNTER — Other Ambulatory Visit: Payer: Self-pay | Admitting: Physician Assistant

## 2021-05-07 DIAGNOSIS — F419 Anxiety disorder, unspecified: Secondary | ICD-10-CM

## 2021-05-07 DIAGNOSIS — F32A Depression, unspecified: Secondary | ICD-10-CM

## 2021-05-09 ENCOUNTER — Ambulatory Visit (INDEPENDENT_AMBULATORY_CARE_PROVIDER_SITE_OTHER): Payer: 59

## 2021-05-09 ENCOUNTER — Other Ambulatory Visit: Payer: Self-pay | Admitting: Physician Assistant

## 2021-05-09 ENCOUNTER — Encounter: Payer: Self-pay | Admitting: Physician Assistant

## 2021-05-09 ENCOUNTER — Other Ambulatory Visit: Payer: Self-pay

## 2021-05-09 ENCOUNTER — Ambulatory Visit (INDEPENDENT_AMBULATORY_CARE_PROVIDER_SITE_OTHER): Payer: 59 | Admitting: Physician Assistant

## 2021-05-09 VITALS — BP 131/71 | HR 91 | Ht 62.0 in | Wt 211.0 lb

## 2021-05-09 DIAGNOSIS — R0781 Pleurodynia: Secondary | ICD-10-CM

## 2021-05-09 DIAGNOSIS — M79671 Pain in right foot: Secondary | ICD-10-CM | POA: Diagnosis not present

## 2021-05-09 DIAGNOSIS — R600 Localized edema: Secondary | ICD-10-CM

## 2021-05-09 DIAGNOSIS — F419 Anxiety disorder, unspecified: Secondary | ICD-10-CM | POA: Diagnosis not present

## 2021-05-09 DIAGNOSIS — F32A Depression, unspecified: Secondary | ICD-10-CM

## 2021-05-09 DIAGNOSIS — Z6838 Body mass index (BMI) 38.0-38.9, adult: Secondary | ICD-10-CM

## 2021-05-09 DIAGNOSIS — E6609 Other obesity due to excess calories: Secondary | ICD-10-CM

## 2021-05-09 MED ORDER — WEGOVY 0.25 MG/0.5ML ~~LOC~~ SOAJ: 0.2500 mg | SUBCUTANEOUS | 0 refills | Status: DC

## 2021-05-09 MED ORDER — FUROSEMIDE 20 MG PO TABS: 20.0000 mg | ORAL_TABLET | Freq: Every day | ORAL | 3 refills | Status: DC | PRN

## 2021-05-09 MED ORDER — VILAZODONE HCL 40 MG PO TABS: 40.0000 mg | ORAL_TABLET | Freq: Every day | ORAL | 3 refills | Status: DC

## 2021-05-09 MED ORDER — DICLOFENAC SODIUM 1 % EX GEL
4.0000 g | Freq: Four times a day (QID) | CUTANEOUS | 1 refills | Status: DC
Start: 2021-05-09 — End: 2022-03-05

## 2021-05-09 NOTE — Progress Notes (Signed)
? ?Subjective:  ? ? Patient ID: Bridget Johnston, female    DOB: 1960/07/03, 61 y.o.   MRN: [id number] ? ?HPI ? ?Patient is a 61 year old obese female with hypertension, asthma, fibromyalgia, hyperlipidemia, anxiety and depression who present to clinic for medication refills and new complaint of right rib pain. ?She states this rib pain began 8 days ago while at a chiropractor appointment. She describes being in an awkward position and experiencing a "tearing" feeling in her rib. The pain worsened for 2-3 days and has remained stable since then. She reports no improvement with Ibuprofen and the pain is exacerbated by deep breaths. She denies chest pain and SOB. ?She also reported rolling her ankle 5-6 weeks ago and this has mostly returned to its baseline since then , but she still experiences occasional pain in the dorsal aspect. She uses KT tape which provides relief. ? ? ?Review of Systems  ?Constitutional:  Negative for fatigue, fever and unexpected weight change.  ?Respiratory: Negative.  Negative for shortness of breath and wheezing.   ?Cardiovascular: Negative.  Negative for chest pain.  ?Gastrointestinal: Negative.  Negative for diarrhea and nausea.  ?Musculoskeletal:  Positive for myalgias (Point tenderness).  ? ?   ?Objective:  ? Physical Exam ?Vitals reviewed.  ?Constitutional:   ?   General: She is not in acute distress. ?   Appearance: Normal appearance.  ?HENT:  ?   Head: Normocephalic and atraumatic.  ?Cardiovascular:  ?   Rate and Rhythm: Normal rate and regular rhythm.  ?   Pulses: Normal pulses.  ?   Heart sounds: Normal heart sounds. No murmur heard. ?Pulmonary:  ?   Effort: Pulmonary effort is normal.  ?   Breath sounds: Normal breath sounds. No wheezing.  ?Musculoskeletal:     ?   General: Tenderness (Right sided rib) present. No swelling. Normal range of motion.  ?   Right lower leg: No edema.  ?   Left lower leg: No edema.  ?Neurological:  ?   Mental Status: She is alert.  ? ? ?.. ?Depression  screen [facility] 2/9 05/09/2021 05/09/2021 11/01/2020 03/18/2020 09/15/2019  ?Decreased Interest 0 0 0 1 0  ?Down, Depressed, Hopeless 0 0 0 1 0  ?PHQ - 2 Score 0 0 0 2 0  ?Altered sleeping - - 0 0 0  ?Tired, decreased energy - - 0 1 0  ?Change in appetite - - 1 1 0  ?Feeling bad or failure about yourself  - - 0 1 0  ?Trouble concentrating - - 0 1 0  ?Moving slowly or fidgety/restless - - 0 0 0  ?Suicidal thoughts - - 0 0 0  ?PHQ-9 Score - - 1 6 0  ?Difficult doing work/chores - - Not difficult at all Somewhat difficult Not difficult at all  ?Some recent data might be hidden  ? ?.. ?GAD 7 : Generalized Anxiety Score 11/01/2020 03/18/2020 09/15/2019 11/16/2018  ?Nervous, Anxious, on Edge 0 1 1 0  ?Control/stop worrying 0 2 2 0  ?Worry too much - different things 0 2 2 0  ?Trouble relaxing 0 1 1 0  ?Restless 0 1 1 0  ?Easily annoyed or irritable 0 1 1 0  ?Afraid - awful might happen 0 0 0 0  ?Total GAD 7 Score 0 8 8 0  ?Anxiety Difficulty - Very difficult Not difficult at all Not difficult at all  ? ? ? ? ? ?   ?Assessment & Plan:  ? ?[name] KitchenRaynelle Johnston was  seen today for follow-up. ? ?Diagnoses and all orders for this visit: ? ?Class 2 obesity due to excess calories without serious comorbidity with body mass index (BMI) of 38.0 to 38.9 in adult ?-     WEGOVY 0.25 MG/0.5ML SOAJ; Inject 0.25 mg into the skin once a week. Use this dose for 1 month (4 shots) and then increase to next higher dose. ? ?Bilateral leg edema ?-     furosemide (LASIX) 20 MG tablet; Take 1 tablet (20 mg total) by mouth daily as needed for fluid or edema. ? ?Anxiety and depression ?-     Vilazodone HCl (VIIBRYD) 40 MG TABS; Take 1 tablet (40 mg total) by mouth daily. ? ?Right foot pain ?-     DG Foot Complete Right; Future ?-     diclofenac Sodium (VOLTAREN) 1 % GEL; Apply 4 g topically 4 (four) times daily. To affected joint. ? ?Rib pain on right side ? ? ?Continue Viibryd for mood.  ? ?Start Voltaren gel up to 4x per day as needed for rib pain, may also use on ankle as needed   ?Will get xray of foot.  ? ?Discussed weight loss medications. Mounjaro and ozempic were not approved.  ?Told to go to [employer] and sign up for coupon cards.  ?Wegovy to try.  ? ?Follow up as scheduled for medication refills or sooner with new or worsening symptoms. ?

## 2021-05-09 NOTE — Telephone Encounter (Signed)
JJ can you explain how she has to titrate and not start at 2.4.

## 2021-05-12 DIAGNOSIS — M79671 Pain in right foot: Secondary | ICD-10-CM | POA: Insufficient documentation

## 2021-05-12 DIAGNOSIS — R0781 Pleurodynia: Secondary | ICD-10-CM | POA: Insufficient documentation

## 2021-05-12 NOTE — Progress Notes (Signed)
Normal foot xray. Consider soft ankle brace for stabilization when going to be on your feet all day.

## 2021-05-14 ENCOUNTER — Telehealth: Payer: Self-pay

## 2021-05-14 MED ORDER — SAXENDA 18 MG/3ML ~~LOC~~ SOPN: 3.0000 mg | PEN_INJECTOR | Freq: Every day | SUBCUTANEOUS | 0 refills | Status: DC

## 2021-05-14 NOTE — Addendum Note (Signed)
Addended by: Donella Stade on: 05/14/2021 12:33 PM ? ? Modules accepted: Orders ? ?

## 2021-05-14 NOTE — Telephone Encounter (Signed)
We can send saxenda but I cannot guarantee they will pay for this either and is a daily shot. Sent to pharmacy.

## 2021-05-14 NOTE — Telephone Encounter (Signed)
Initiated Prior authorization NOB:SJGGEZ 0.25MG /0.5ML auto-injectors ?Via: Covermymeds ?Case/Key: [id number] ?Status: approved  as of 05/14/21 ?Reason:Pt has high co-pay. Pt also has a primary and secondary ins that was not added [facility], informed [facility] of the secondary ins even with both added on [facility] the co payment will be $1100 spoke to pt in regards to this  she explained that she would like an alternative medication that is cheaper ?Notified Pt via: Mychart ? ?Initiated Prior authorization MOQ:[id number] Sodium 1% gel ?Via: Covermymeds ?Case/Key:n/a ?Status: Cancelled as of 05/14/21 ?Reason:per pt she will purchase out of pocket medication is cheaper  ?Notified Pt via: Mychart ?

## 2021-05-21 ENCOUNTER — Encounter: Payer: Self-pay | Admitting: Physician Assistant

## 2021-05-21 DIAGNOSIS — E6609 Other obesity due to excess calories: Secondary | ICD-10-CM

## 2021-05-21 MED ORDER — QSYMIA 15-92 MG PO CP24: 1.0000 | ORAL_CAPSULE | Freq: Every morning | ORAL | 1 refills | Status: DC

## 2021-05-21 NOTE — Telephone Encounter (Signed)
Please call patient and explain this to her. If we need to re-[facility] we can.

## 2021-05-24 ENCOUNTER — Other Ambulatory Visit: Payer: Self-pay | Admitting: Physician Assistant

## 2021-05-24 DIAGNOSIS — R4701 Aphasia: Secondary | ICD-10-CM

## 2021-05-24 DIAGNOSIS — E782 Mixed hyperlipidemia: Secondary | ICD-10-CM

## 2021-05-24 DIAGNOSIS — R4789 Other speech disturbances: Secondary | ICD-10-CM

## 2021-05-24 DIAGNOSIS — J4520 Mild intermittent asthma, uncomplicated: Secondary | ICD-10-CM

## 2021-05-26 ENCOUNTER — Other Ambulatory Visit: Payer: Self-pay | Admitting: Neurology

## 2021-05-26 DIAGNOSIS — M797 Fibromyalgia: Secondary | ICD-10-CM

## 2021-05-26 MED ORDER — GABAPENTIN 300 MG PO CAPS: ORAL_CAPSULE | ORAL | 1 refills | Status: DC

## 2021-06-02 ENCOUNTER — Other Ambulatory Visit: Payer: Self-pay | Admitting: Neurology

## 2021-06-02 DIAGNOSIS — F419 Anxiety disorder, unspecified: Secondary | ICD-10-CM

## 2021-06-02 MED ORDER — VILAZODONE HCL 40 MG PO TABS: 40.0000 mg | ORAL_TABLET | Freq: Every day | ORAL | 3 refills | Status: DC

## 2021-06-23 ENCOUNTER — Other Ambulatory Visit: Payer: Self-pay | Admitting: Neurology

## 2021-06-23 DIAGNOSIS — J4520 Mild intermittent asthma, uncomplicated: Secondary | ICD-10-CM

## 2021-06-23 MED ORDER — BUDESONIDE-FORMOTEROL FUMARATE 160-4.5 MCG/ACT IN AERO: 2.0000 | INHALATION_SPRAY | Freq: Two times a day (BID) | RESPIRATORY_TRACT | 5 refills | Status: DC

## 2021-07-28 ENCOUNTER — Other Ambulatory Visit: Payer: Self-pay | Admitting: Neurology

## 2021-07-28 DIAGNOSIS — R4701 Aphasia: Secondary | ICD-10-CM

## 2021-07-28 DIAGNOSIS — R404 Transient alteration of awareness: Secondary | ICD-10-CM

## 2021-07-28 MED ORDER — ZONISAMIDE 100 MG PO CAPS: ORAL_CAPSULE | ORAL | 1 refills | Status: DC

## 2021-08-07 ENCOUNTER — Other Ambulatory Visit: Payer: Self-pay | Admitting: Physician Assistant

## 2021-08-07 DIAGNOSIS — F32A Depression, unspecified: Secondary | ICD-10-CM

## 2021-08-10 ENCOUNTER — Other Ambulatory Visit: Payer: Self-pay | Admitting: Physician Assistant

## 2021-08-10 DIAGNOSIS — R600 Localized edema: Secondary | ICD-10-CM

## 2021-08-14 ENCOUNTER — Other Ambulatory Visit: Payer: Self-pay | Admitting: Physician Assistant

## 2021-08-14 DIAGNOSIS — J4520 Mild intermittent asthma, uncomplicated: Secondary | ICD-10-CM

## 2021-08-22 ENCOUNTER — Other Ambulatory Visit: Payer: Self-pay | Admitting: Physician Assistant

## 2021-08-22 DIAGNOSIS — R600 Localized edema: Secondary | ICD-10-CM

## 2021-09-20 ENCOUNTER — Other Ambulatory Visit: Payer: Self-pay | Admitting: Physician Assistant

## 2021-09-20 DIAGNOSIS — J4520 Mild intermittent asthma, uncomplicated: Secondary | ICD-10-CM

## 2021-09-23 ENCOUNTER — Other Ambulatory Visit: Payer: Self-pay | Admitting: Neurology

## 2021-09-23 MED ORDER — ONETOUCH DELICA PLUS LANCET30G MISC: 12 refills | Status: DC

## 2021-09-28 ENCOUNTER — Encounter: Payer: Self-pay | Admitting: Physician Assistant

## 2021-10-30 ENCOUNTER — Other Ambulatory Visit: Payer: Self-pay | Admitting: Physician Assistant

## 2021-10-30 DIAGNOSIS — M797 Fibromyalgia: Secondary | ICD-10-CM

## 2021-11-07 ENCOUNTER — Other Ambulatory Visit: Payer: Self-pay | Admitting: Physician Assistant

## 2021-11-07 DIAGNOSIS — F32A Depression, unspecified: Secondary | ICD-10-CM

## 2021-11-13 ENCOUNTER — Other Ambulatory Visit: Payer: Self-pay | Admitting: Physician Assistant

## 2021-11-13 DIAGNOSIS — R600 Localized edema: Secondary | ICD-10-CM

## 2021-11-14 ENCOUNTER — Encounter: Payer: Self-pay | Admitting: Physician Assistant

## 2021-11-14 ENCOUNTER — Ambulatory Visit (INDEPENDENT_AMBULATORY_CARE_PROVIDER_SITE_OTHER): Payer: 59 | Admitting: Physician Assistant

## 2021-11-14 VITALS — BP 135/86 | HR 83 | Wt 220.0 lb

## 2021-11-14 DIAGNOSIS — E782 Mixed hyperlipidemia: Secondary | ICD-10-CM

## 2021-11-14 DIAGNOSIS — E559 Vitamin D deficiency, unspecified: Secondary | ICD-10-CM

## 2021-11-14 DIAGNOSIS — Z0000 Encounter for general adult medical examination without abnormal findings: Secondary | ICD-10-CM

## 2021-11-14 DIAGNOSIS — J4520 Mild intermittent asthma, uncomplicated: Secondary | ICD-10-CM

## 2021-11-14 DIAGNOSIS — F32A Depression, unspecified: Secondary | ICD-10-CM

## 2021-11-14 DIAGNOSIS — F419 Anxiety disorder, unspecified: Secondary | ICD-10-CM

## 2021-11-14 DIAGNOSIS — I10 Essential (primary) hypertension: Secondary | ICD-10-CM | POA: Diagnosis not present

## 2021-11-14 DIAGNOSIS — R600 Localized edema: Secondary | ICD-10-CM

## 2021-11-14 DIAGNOSIS — Z6841 Body Mass Index (BMI) 40.0 and over, adult: Secondary | ICD-10-CM

## 2021-11-14 MED ORDER — BUDESONIDE-FORMOTEROL FUMARATE 160-4.5 MCG/ACT IN AERO: 2.0000 | INHALATION_SPRAY | Freq: Two times a day (BID) | RESPIRATORY_TRACT | 5 refills | Status: DC

## 2021-11-14 MED ORDER — FUROSEMIDE 20 MG PO TABS: 20.0000 mg | ORAL_TABLET | Freq: Every day | ORAL | 3 refills | Status: DC | PRN

## 2021-11-14 MED ORDER — BUSPIRONE HCL 10 MG PO TABS: ORAL_TABLET | ORAL | 3 refills | Status: DC

## 2021-11-14 MED ORDER — LOMAIRA 8 MG PO TABS: 1.0000 | ORAL_TABLET | Freq: Three times a day (TID) | ORAL | 0 refills | Status: DC

## 2021-11-14 NOTE — Progress Notes (Signed)
Complete physical exam  Patient: Bridget Johnston   DOB: December 26, 1960   61 y.o. Female  MRN: [id number]  Subjective:    Chief Complaint  Patient presents with  . ADHD    Bridget Johnston is a 61 y.o. female who presents today for a complete physical exam. She reports consuming a general diet. She does walk almost daily and stays very active at home. She generally feels well. She reports sleeping well. She does have additional problems to discuss today.   She continues to want to lose weight. She is walking daily. 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 Insurance will not pay for GLP-1s.    Most recent fall risk assessment:    11/14/2021   10:06 AM  [place] in the past year? 0  Number falls in past yr: 0  Injury with Fall? 0  Risk for fall due to : No Fall Risks  Follow up Falls evaluation completed     Most recent depression screenings:    11/14/2021   10:06 AM 05/09/2021   10:49 AM  PHQ 2/9 Scores  PHQ - 2 Score 0 0    Vision:Within last year and Dental: No current dental problems and Receives regular dental care  Patient Active Problem List   Diagnosis Date Noted  . Rib pain on right side 05/12/2021  . Right foot pain 05/12/2021  . 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 Stress due to family tension 03/18/2020  . Osteopenia 01/24/2020  . Word finding difficulty 09/18/2019  . History of fracture 09/18/2019  . Post-menopausal 09/18/2019  . Chronic left-sided thoracic back pain 09/18/2019  . Iron deficiency anemia secondary to inadequate dietary iron intake 11/18/2018  . Prediabetes 01/01/2017  . History of aphasia 01/01/2017  . Class 2 obesity due to excess calories without serious comorbidity with body mass index (BMI) of 38.0 to 38.9 in adult 01/01/2017  . Fatigue 01/01/2017  . BMI 39.0-39.9,adult 11/03/2016  . Awareness alteration, transient 10/12/2016  . History of pneumonia 09/10/2016  . History of acute respiratory failure 09/10/2016  . Peripheral  edema 09/10/2016  . Encephalitis   . 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 Hyponatremia   . Aphasia 06/21/2016  . Essential hypertension 06/21/2016  . Hypotension due to drugs 06/19/2016  . Echocardiogram shows left ventricular diastolic dysfunction 60/63/0160  . Hyperlipidemia 07/23/2014  . Hyperglycemia 07/23/2014  . Vitamin D deficiency 07/23/2014  . Elevated liver enzymes 07/23/2014  . Anxiety and depression 07/23/2014  . Fibromyalgia 07/23/2014  . Opiate addiction ([place]) 01/03/2014  . Asthma 01/03/2014  . History of pyelonephritis 10/08/2010   Past Medical History:  Diagnosis Date  . Arthritis   . Asthma   . Back pain   . Diabetes mellitus without complication ([place])   . Finger pain   . Hand pain   . High triglycerides   . Leg pain    Family History  Problem Relation Age of Onset  . Heart disease Mother   . Hypertension Mother   . Heart disease Father   . Diabetes Father   . Hypertension Father   . Arthritis Other   . Diabetes Other   . Cancer - Colon Maternal Grandfather    Allergies  Allergen Reactions  . Adhesive [Tape] Rash  . Prednisone Hypertension      Patient Care Team: Lavada Mesi as PCP - General (Family Medicine)   Outpatient Medications Prior to Visit  Medication Sig  .  5-Methyltetrahydrofolate (METHYL FOLATE) POWD   . albuterol (VENTOLIN HFA) 108 (90 Base) MCG/ACT inhaler INHALE 2 PUFFS INTO THE LUNGS EVERY 4 HOURS AS NEEDED FOR WHEEZING OR SHORTNESS OF BREATH  . AMBULATORY NON FORMULARY MEDICATION Take 1 tablet by mouth daily. Patient reports taking wheat grass to help control her glucose.  . Ascorbic Acid (VITAMIN C) 1000 MG tablet Take 1,000 mg by mouth daily.  [name] Kitchen atorvastatin (LIPITOR) 40 MG tablet TAKE 1 TABLET(40 MG) BY MOUTH DAILY  . B Complex-Biotin-FA (B COMPLEX 100 TR PO) Take 100 mg by mouth daily.  [name] Kitchen BIOTIN 5000 PO Take 5,000 mcg by mouth daily.  . Blood Glucose Monitoring Suppl (ONE TOUCH ULTRA MINI) w/Device KIT  Check fasting blood sugar daily.  [name] Kitchen CALCIUM PO Take 1,000 mg by mouth in the morning, at noon, and at bedtime.  . cholecalciferol (VITAMIN D) 1000 units tablet Take 2 tablets (2,000 Units total) by mouth daily.  . diclofenac Sodium (VOLTAREN) 1 % GEL Apply 4 g topically 4 (four) times daily. To affected joint.  . diphenhydrAMINE (BENADRYL) 25 MG tablet Take 25 mg by mouth every 6 (six) hours as needed.  . Emollient (COLLAGEN EX)   . gabapentin (NEURONTIN) 300 MG capsule TAKE 1 CAPSULE(300 MG) BY MOUTH FOUR TIMES DAILY  . glucose blood (ONETOUCH VERIO) test strip USE TO TEST BLOOD SUGAR DAILY  . ipratropium (ATROVENT) 0.06 % nasal spray USE 1 SPRAY IN EACH NOSTRIL FOUR TIMES DAILY AS NEEDED  . Lancets (ONETOUCH DELICA PLUS URKYHC62B) MISC To check blood sugars up to 4 times daily  . Loratadine 10 MG CAPS   . LYSINE PO Take 1,000 mg by mouth daily.  . Magnesium 500 MG CAPS   . Multiple Vitamin (MULTI-VITAMIN DAILY PO)   . omega-3 acid ethyl esters (LOVAZA) 1 g capsule Take 2 capsules (2 g total) by mouth 2 (two) times daily.  . Vilazodone HCl (VIIBRYD) 40 MG TABS Take 1 tablet (40 mg total) by mouth daily.  . Vitamin D, Ergocalciferol, (DRISDOL) 1.25 MG (50000 UNIT) CAPS capsule Take 1 capsule (50,000 Units total) by mouth every 7 (seven) days. Take for 8 total doses(weeks)  . Zinc Sulfate (ZINC 15 PO)   . zonisamide (ZONEGRAN) 100 MG capsule Take 2 capsules by mouth at bedtime.  . [DISCONTINUED] budesonide-formoterol (SYMBICORT) 160-4.5 MCG/ACT inhaler Inhale 2 puffs into the lungs 2 (two) times daily.  . [DISCONTINUED] busPIRone (BUSPAR) 10 MG tablet TAKE 1 TABLET BY MOUTH THREE TIMES DAILY/ needs appt  . [DISCONTINUED] furosemide (LASIX) 20 MG tablet Take 1 tablet (20 mg total) by mouth daily as needed for fluid or edema. Appt for refills  . [DISCONTINUED] Liraglutide -Weight Management (SAXENDA) 18 MG/3ML SOPN Inject 3 mg into the skin daily. 0.6 mg inj subcut daily for 1 week, then incr by  0.6 mg weekly until reaching 3 mg injected subcut daily  . [DISCONTINUED] Phentermine-Topiramate (QSYMIA) 15-92 MG CP24 Take 1 capsule by mouth every morning.  . [DISCONTINUED] Phentermine-Topiramate (QSYMIA) 15-92 MG CP24 Take 1 capsule by mouth every morning.  . [DISCONTINUED] WEGOVY 0.25 MG/0.5ML SOAJ Inject 0.25 mg into the skin once a week. Use this dose for 1 month (4 shots) and then increase to next higher dose.   No facility-administered medications prior to visit.    ROS   See HPI.      Objective:     BP 135/86   Pulse 83   Wt 220 lb (99.8 kg)   SpO2 98%   BMI  40.24 kg/m  BP Readings from Last 3 Encounters:  11/14/21 135/86  05/09/21 131/71  01/04/21 122/80   Wt Readings from Last 3 Encounters:  11/14/21 220 lb (99.8 kg)  05/09/21 211 lb (95.7 kg)  01/04/21 203 lb (92.1 kg)      Physical Exam      Assessment & Plan:    Routine Health Maintenance and Physical Exam  Immunization History  Administered Date(s) Administered  . Influenza Split 12/04/2013  . Influenza,inj,Quad PF,6+ Mos 11/02/2016, 11/24/2017, 11/16/2018  . Influenza-Unspecified 10/31/2015, 11/16/2018  . Pneumococcal Polysaccharide-23 01/01/2017  . Tdap 10/30/2014    Health Maintenance  Topic Date Due  . PAP SMEAR-Modifier  11/15/2021  . Zoster Vaccines- Shingrix (1 of 2) 02/13/2022 (Originally 02/18/1979)  . INFLUENZA VACCINE  06/07/2022 (Originally 10/07/2021)  . MAMMOGRAM  04/04/2023  . TETANUS/TDAP  10/29/2024  . COLONOSCOPY (Pts 45-37yr Insurance coverage will need to be confirmed)  04/06/2026  . Hepatitis C Screening  Completed  . HIV Screening  Completed  . HPV VACCINES  Aged Out  . COVID-19 Vaccine  Discontinued    Discussed health benefits of physical activity, and encouraged her to engage in regular exercise appropriate for her age and condition.  .[name] KitchenLilyan Puntwas seen today for adhd.  Diagnoses and all orders for this visit:  Routine physical examination -     COMPLETE  METABOLIC PANEL WITH GFR -     Lipid Panel w/reflex Direct LDL -     VITAMIN D 25 Hydroxy (Vit-D Deficiency, Fractures)  Mixed hyperlipidemia -     Lipid Panel w/reflex Direct LDL  Essential hypertension -     COMPLETE METABOLIC PANEL WITH GFR  Bilateral leg edema -     furosemide (LASIX) 20 MG tablet; Take 1 tablet (20 mg total) by mouth daily as needed for fluid or edema.  Anxiety and depression -     busPIRone (BUSPAR) 10 MG tablet; TAKE 1 TABLET BY MOUTH THREE TIMES DAILY.  Mild intermittent asthma without complication -     budesonide-formoterol (SYMBICORT) 160-4.5 MCG/ACT inhaler; Inhale 2 puffs into the lungs 2 (two) times daily.  Vitamin D deficiency -     VITAMIN D 25 Hydroxy (Vit-D Deficiency, Fractures)  Class 3 severe obesity due to excess calories without serious comorbidity with body mass index (BMI) of 40.0 to 44.9 in adult (HCC) -     TSH -     Phentermine HCl (LOMAIRA) 8 MG TABS; Take 1 tablet by mouth 3 (three) times daily before meals.   Pt   Return in about 3 months (around 02/13/2022).     JIran Planas PA-C

## 2021-11-14 NOTE — Patient Instructions (Signed)

## 2021-11-15 LAB — COMPLETE METABOLIC PANEL WITH GFR
AG Ratio: 2 (calc) (ref 1.0–2.5)
ALT: 19 U/L (ref 6–29)
AST: 19 U/L (ref 10–35)
Albumin: 4.6 g/dL (ref 3.6–5.1)
Alkaline phosphatase (APISO): 64 U/L (ref 37–153)
BUN/Creatinine Ratio: 19 (calc) (ref 6–22)
BUN: 21 mg/dL (ref 7–25)
CO2: 26 mmol/L (ref 20–32)
Calcium: 10.3 mg/dL (ref 8.6–10.4)
Chloride: 107 mmol/L (ref 98–110)
Creat: 1.11 mg/dL — ABNORMAL HIGH (ref 0.50–1.05)
Globulin: 2.3 g/dL (calc) (ref 1.9–3.7)
Glucose, Bld: 105 mg/dL — ABNORMAL HIGH (ref 65–99)
Potassium: 5.5 mmol/L — ABNORMAL HIGH (ref 3.5–5.3)
Sodium: 140 mmol/L (ref 135–146)
Total Bilirubin: 0.6 mg/dL (ref 0.2–1.2)
Total Protein: 6.9 g/dL (ref 6.1–8.1)
eGFR: 57 mL/min/{1.73_m2} — ABNORMAL LOW (ref >=60)

## 2021-11-15 LAB — LIPID PANEL W/REFLEX DIRECT LDL
Cholesterol: 293 mg/dL — ABNORMAL HIGH (ref <200)
HDL: 63 mg/dL (ref >=50)
LDL Cholesterol (Calc): 193 mg/dL (calc) — ABNORMAL HIGH
Non-HDL Cholesterol (Calc): 230 mg/dL (calc) — ABNORMAL HIGH (ref <130)
Total CHOL/HDL Ratio: 4.7 (calc) (ref <5.0)
Triglycerides: 191 mg/dL — ABNORMAL HIGH (ref <150)

## 2021-11-15 LAB — TSH: TSH: 0.81 mIU/L (ref 0.40–4.50)

## 2021-11-15 LAB — VITAMIN D 25 HYDROXY (VIT D DEFICIENCY, FRACTURES): Vit D, 25-Hydroxy: 30 ng/mL (ref 30–100)

## 2021-11-17 ENCOUNTER — Other Ambulatory Visit: Payer: Self-pay | Admitting: Physician Assistant

## 2021-11-17 ENCOUNTER — Other Ambulatory Visit: Payer: Self-pay | Admitting: Neurology

## 2021-11-17 ENCOUNTER — Encounter: Payer: Self-pay | Admitting: Physician Assistant

## 2021-11-17 DIAGNOSIS — N289 Disorder of kidney and ureter, unspecified: Secondary | ICD-10-CM

## 2021-11-17 MED ORDER — VITAMIN D (ERGOCALCIFEROL) 1.25 MG (50000 UNIT) PO CAPS: 50000.0000 [IU] | ORAL_CAPSULE | ORAL | 1 refills | Status: DC

## 2021-11-17 NOTE — Progress Notes (Signed)
Sahar,   Thyroid looks great.  Vitamin D is better but not to goal and just in normal range. Take with protein or dairy to help with absorption. I would add d3 1000 units daily OTC.   Good cholesterol looks great.  LDL, TG are very high and greatly increased from 1 year ago.  What happened? Diet?  I do suggest diet changes and consideration of statin a cholesterol lowering medication.   [name] Kitchen.The 10-year ASCVD risk score (Arnett DK, et al., 2019) is: 6.6%   Values used to calculate the score:     Age: 61 years     Sex: Female     Is Non-Hispanic African American: No     Diabetic: No     Tobacco smoker: No     Systolic Blood Pressure: 135 mmHg     Is BP treated: Yes     HDL Cholesterol: 63 mg/dL     Total Cholesterol: 293 mg/dL   Liver looks good.   Kidney function decreased and potassium is elevated.  Make sure you not taking any extra potassium.  Make sure you are staying hydrated, no anti inflammatories and lets recheck your kidney function on Friday.

## 2021-11-18 ENCOUNTER — Other Ambulatory Visit: Payer: Self-pay | Admitting: Neurology

## 2021-11-18 MED ORDER — OMEGA-3-ACID ETHYL ESTERS 1 G PO CAPS: 2.0000 | ORAL_CAPSULE | Freq: Two times a day (BID) | ORAL | 3 refills | Status: DC

## 2021-11-22 ENCOUNTER — Encounter: Payer: Self-pay | Admitting: Physician Assistant

## 2021-11-22 LAB — BASIC METABOLIC PANEL
BUN: 14 mg/dL (ref 7–25)
CO2: 26 mmol/L (ref 20–32)
Calcium: 9.6 mg/dL (ref 8.6–10.4)
Chloride: 106 mmol/L (ref 98–110)
Creat: 0.84 mg/dL (ref 0.50–1.05)
Glucose, Bld: 105 mg/dL — ABNORMAL HIGH (ref 65–99)
Potassium: 5.2 mmol/L (ref 3.5–5.3)
Sodium: 139 mmol/L (ref 135–146)

## 2021-11-24 NOTE — Progress Notes (Signed)
Bridget Johnston,  Kidney function and electrolytes look MUCH better!

## 2021-11-24 NOTE — Telephone Encounter (Signed)
See other MyChart message

## 2021-12-07 ENCOUNTER — Other Ambulatory Visit: Payer: Self-pay | Admitting: Physician Assistant

## 2021-12-07 ENCOUNTER — Encounter: Payer: Self-pay | Admitting: Physician Assistant

## 2021-12-07 DIAGNOSIS — E782 Mixed hyperlipidemia: Secondary | ICD-10-CM

## 2021-12-07 DIAGNOSIS — R4789 Other speech disturbances: Secondary | ICD-10-CM

## 2021-12-07 DIAGNOSIS — R4701 Aphasia: Secondary | ICD-10-CM

## 2021-12-07 DIAGNOSIS — L237 Allergic contact dermatitis due to plants, except food: Secondary | ICD-10-CM

## 2021-12-08 MED ORDER — METHYLPREDNISOLONE 4 MG PO TBPK: ORAL_TABLET | ORAL | 0 refills | Status: DC

## 2021-12-08 MED ORDER — TRIAMCINOLONE ACETONIDE 0.1 % EX CREA: 1.0000 | TOPICAL_CREAM | Freq: Two times a day (BID) | CUTANEOUS | 0 refills | Status: DC

## 2021-12-08 NOTE — Telephone Encounter (Signed)
Working in the yard. Itchy rash over bilateral arms and genitals.  Lotrimin and cortisone cream not helping.   Discussed benadryl for itching, medrol dose pack sent, triamcinolone for areas of rash.

## 2021-12-09 ENCOUNTER — Other Ambulatory Visit: Payer: Self-pay | Admitting: Physician Assistant

## 2021-12-12 ENCOUNTER — Encounter: Payer: Self-pay | Admitting: Physician Assistant

## 2021-12-14 ENCOUNTER — Other Ambulatory Visit: Payer: Self-pay | Admitting: Physician Assistant

## 2021-12-14 DIAGNOSIS — F419 Anxiety disorder, unspecified: Secondary | ICD-10-CM

## 2021-12-14 DIAGNOSIS — F32A Depression, unspecified: Secondary | ICD-10-CM

## 2021-12-31 ENCOUNTER — Other Ambulatory Visit: Payer: Self-pay | Admitting: Physician Assistant

## 2021-12-31 ENCOUNTER — Encounter: Payer: Self-pay | Admitting: Physician Assistant

## 2021-12-31 DIAGNOSIS — R404 Transient alteration of awareness: Secondary | ICD-10-CM

## 2021-12-31 DIAGNOSIS — R4701 Aphasia: Secondary | ICD-10-CM

## 2022-01-05 ENCOUNTER — Telehealth (INDEPENDENT_AMBULATORY_CARE_PROVIDER_SITE_OTHER): Payer: 59 | Admitting: Physician Assistant

## 2022-01-05 ENCOUNTER — Encounter: Payer: Self-pay | Admitting: Physician Assistant

## 2022-01-05 VITALS — Temp 98.7°F | Ht 62.0 in | Wt 210.0 lb

## 2022-01-05 DIAGNOSIS — J40 Bronchitis, not specified as acute or chronic: Secondary | ICD-10-CM

## 2022-01-05 DIAGNOSIS — J329 Chronic sinusitis, unspecified: Secondary | ICD-10-CM | POA: Diagnosis not present

## 2022-01-05 MED ORDER — FLUCONAZOLE 150 MG PO TABS: 150.0000 mg | ORAL_TABLET | Freq: Once | ORAL | 0 refills | Status: AC

## 2022-01-05 MED ORDER — METHYLPREDNISOLONE 4 MG PO TBPK: ORAL_TABLET | ORAL | 0 refills | Status: DC

## 2022-01-05 MED ORDER — BENZONATATE 200 MG PO CAPS: 200.0000 mg | ORAL_CAPSULE | Freq: Three times a day (TID) | ORAL | 0 refills | Status: DC | PRN

## 2022-01-05 MED ORDER — AZITHROMYCIN 250 MG PO TABS: ORAL_TABLET | ORAL | 0 refills | Status: DC

## 2022-01-05 NOTE — Progress Notes (Signed)
Started last Tuesday Congestion Eyes itching Green mucous Sore throat Cough (trouble sleeping)  Taking Mucinex and Sudafed  Covid negative at home

## 2022-01-05 NOTE — Progress Notes (Signed)
..Virtual Visit via Video Note  I connected with Bridget Johnston on 01/05/22 at  8:30 AM EDT by a video enabled telemedicine application and verified that I am speaking with the correct person using two identifiers.  Location: Patient: home Provider: clinic  .[name] KitchenParticipating in visit:  Patient: Bridget Johnston Provider: Iran Planas PA-C   I discussed the limitations of evaluation and management by telemedicine and the availability of in person appointments. The patient expressed understanding and agreed to proceed.  History of Present Illness: Pt is a 61 yo female with HTN and asthma who has had URI symptoms for 6 days. She is on symbicort daily for asthma. Sputum started out clear and progressed to green. Her husband is sick as well. Both tested negative for covid. She has ST, itchy eyes, sinus pressure, runny nose, fatigue. Cough is keeping her up at night. Taking mucinex and sudafed with minimal benefit. No body aches, fever, chills, diarrhea. She does have some SOB and chest tightness. Albuterol helps with that.  .. 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    Hyponatremia    Encephalitis    History of pneumonia 09/10/2016   History of acute respiratory failure 09/10/2016   Peripheral edema 09/10/2016   Awareness alteration, transient 10/12/2016   BMI 39.0-39.9,adult 11/03/2016   Prediabetes 01/01/2017   History of aphasia 01/01/2017   Class 2 obesity due to excess calories without serious  comorbidity with body mass index (BMI) of 38.0 to 38.9 in adult 01/01/2017   Fatigue 01/01/2017   Iron deficiency anemia secondary to inadequate dietary iron intake 11/18/2018   Word finding difficulty 09/18/2019   History of fracture 09/18/2019   Post-menopausal 09/18/2019   Chronic left-sided thoracic back pain 09/18/2019   Osteopenia 01/24/2020   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   Stress due to family tension 03/18/2020   Rib pain on right side 05/12/2021   Right foot pain 05/12/2021   Resolved Ambulatory Problems    Diagnosis Date Noted   UTI 10/08/2010   Acute respiratory failure with hypoxia ([place]) 06/19/2016   History of ischemic stroke in prior three months 06/21/2016   Diabetes mellitus type 2 in obese [facility])    Morbid obesity ([place])    Diabetes mellitus without complication ([place]) 20/35/5974   Past Medical History:  Diagnosis Date   Arthritis    Back pain    Finger pain    Hand pain    High triglycerides    Leg pain        Observations/Objective: No acute distress Normal mood and appearance Productive cough  .[name] Kitchen Today's Vitals   01/05/22 0816  Temp: 98.7 F (37.1 C)  TempSrc: Oral  Weight: 210 lb (95.3 kg)  Height: 5\' 2"  (1.575 m)   Body mass index is 38.41 kg/m.    Assessment and Plan: [name] KitchenMarland KitchenArnetha was seen today for nasal congestion.  Diagnoses and all orders for this visit:  Sinobronchitis -     methylPREDNISolone (MEDROL DOSEPAK) 4 MG TBPK tablet; Take as directed by package insert. -  azithromycin (ZITHROMAX Z-PAK) 250 MG tablet; Take 2 tablets (500 mg) on  Day 1,  followed by 1 tablet (250 mg) once daily on Days 2 through 5. -     fluconazole (DIFLUCAN) 150 MG tablet; Take 1 tablet (150 mg total) by mouth once for 1 dose. -     benzonatate (TESSALON) 200 MG capsule; Take 1 capsule (200 mg total) by mouth 3 (three) times daily as needed.   Pt has one week of URI symptoms that are worsening Covid negative Zpak, medrol dose pak, tessalon pearls sent  to pharmacy Albuterol has needed for cough/SOB Diflucan for yeast after abx Delsym for cough at bedtime Rest and hydrate Follow up as needed or if symptoms persist or worsen   Follow Up Instructions:    I discussed the assessment and treatment plan with the patient. The patient was provided an opportunity to ask questions and all were answered. The patient agreed with the plan and demonstrated an understanding of the instructions.   The patient was advised to call back or seek an in-person evaluation if the symptoms worsen or if the condition fails to improve as anticipated.    Tandy Gaw, PA-C

## 2022-02-08 ENCOUNTER — Other Ambulatory Visit: Payer: Self-pay | Admitting: Neurology

## 2022-02-08 DIAGNOSIS — R4701 Aphasia: Secondary | ICD-10-CM

## 2022-02-08 DIAGNOSIS — R404 Transient alteration of awareness: Secondary | ICD-10-CM

## 2022-02-09 ENCOUNTER — Encounter: Payer: Self-pay | Admitting: Physician Assistant

## 2022-02-09 MED ORDER — LOMAIRA 8 MG PO TABS: 1.0000 | ORAL_TABLET | Freq: Three times a day (TID) | ORAL | 0 refills | Status: DC

## 2022-02-09 NOTE — Telephone Encounter (Signed)
Meds ordered this encounter  Medications   Phentermine HCl (LOMAIRA) 8 MG TABS    Sig: Take 1 tablet by mouth 3 (three) times daily before meals.    Dispense:  90 tablet    Refill:  0

## 2022-02-13 ENCOUNTER — Ambulatory Visit: Payer: 59 | Admitting: Physician Assistant

## 2022-03-05 ENCOUNTER — Encounter: Payer: Self-pay | Admitting: Physician Assistant

## 2022-03-05 ENCOUNTER — Ambulatory Visit (INDEPENDENT_AMBULATORY_CARE_PROVIDER_SITE_OTHER): Payer: 59 | Admitting: Physician Assistant

## 2022-03-05 VITALS — BP 118/69 | HR 99 | Ht 62.0 in | Wt 221.0 lb

## 2022-03-05 DIAGNOSIS — E559 Vitamin D deficiency, unspecified: Secondary | ICD-10-CM

## 2022-03-05 DIAGNOSIS — F419 Anxiety disorder, unspecified: Secondary | ICD-10-CM | POA: Diagnosis not present

## 2022-03-05 DIAGNOSIS — R404 Transient alteration of awareness: Secondary | ICD-10-CM

## 2022-03-05 DIAGNOSIS — I10 Essential (primary) hypertension: Secondary | ICD-10-CM

## 2022-03-05 DIAGNOSIS — J4520 Mild intermittent asthma, uncomplicated: Secondary | ICD-10-CM | POA: Diagnosis not present

## 2022-03-05 DIAGNOSIS — E782 Mixed hyperlipidemia: Secondary | ICD-10-CM

## 2022-03-05 DIAGNOSIS — F32A Depression, unspecified: Secondary | ICD-10-CM

## 2022-03-05 DIAGNOSIS — R4701 Aphasia: Secondary | ICD-10-CM

## 2022-03-05 MED ORDER — BUSPIRONE HCL 15 MG PO TABS: 15.0000 mg | ORAL_TABLET | Freq: Three times a day (TID) | ORAL | 1 refills | Status: DC

## 2022-03-05 MED ORDER — ALBUTEROL SULFATE HFA 108 (90 BASE) MCG/ACT IN AERS: 2.0000 | INHALATION_SPRAY | RESPIRATORY_TRACT | 1 refills | Status: DC | PRN

## 2022-03-05 NOTE — Patient Instructions (Addendum)
Increase buspar to 15mg  three times a day Get labs in 1-2 months

## 2022-03-05 NOTE — Progress Notes (Signed)
Pt reports that she is fasting today if she needs to have labs done.

## 2022-03-10 ENCOUNTER — Encounter: Payer: Self-pay | Admitting: Physician Assistant

## 2022-03-10 ENCOUNTER — Other Ambulatory Visit: Payer: Self-pay | Admitting: Family Medicine

## 2022-03-10 MED ORDER — LOMAIRA 8 MG PO TABS: 1.0000 | ORAL_TABLET | Freq: Three times a day (TID) | ORAL | 1 refills | Status: DC

## 2022-03-10 MED ORDER — ZONISAMIDE 100 MG PO CAPS: ORAL_CAPSULE | ORAL | 1 refills | Status: DC

## 2022-03-10 NOTE — Telephone Encounter (Signed)
Rx pended

## 2022-03-10 NOTE — Progress Notes (Signed)
Established Patient Office Visit  Subjective   Patient ID: Bridget Johnston, female    DOB: [birth year]  Age: 62 y.o. MRN: [id number]  No chief complaint on file.   HPI Pt is a 62 yo obese female with HTN, anxiety, MDD, HLD, pre-diabetes who presents to the clinic for follow up.   Pt is doing ok but struggling with anxiety since her husband had MI. No SI/HC.   She denies any CP, palpitations, headaches or vision changes.   She is taking medication daily.   .. 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    Hyponatremia    Encephalitis    History of pneumonia 09/10/2016   History of acute respiratory failure 09/10/2016   Peripheral edema 09/10/2016   Awareness alteration, transient 10/12/2016   BMI 39.0-39.9,adult 11/03/2016   Prediabetes 01/01/2017   History of aphasia 01/01/2017   Class 2 obesity due to excess calories without serious comorbidity with body mass index (BMI) of 38.0 to 38.9 in adult 01/01/2017   Fatigue 01/01/2017   Iron deficiency anemia secondary to inadequate dietary iron intake 11/18/2018   Word finding difficulty 09/18/2019   History of fracture 09/18/2019   Post-menopausal 09/18/2019   Chronic left-sided thoracic back pain 09/18/2019   Osteopenia 01/24/2020   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   Stress due to family tension 03/18/2020   Rib pain on right side 05/12/2021   Right foot pain 05/12/2021   Resolved Ambulatory Problems    Diagnosis Date Noted    UTI 10/08/2010   Acute respiratory failure with hypoxia ([place]) 06/19/2016   History of ischemic stroke in prior three months 06/21/2016   Diabetes mellitus type 2 in obese [facility])    Morbid obesity ([place])    Diabetes mellitus without complication ([place]) 26/37/8588   Past Medical History:  Diagnosis Date   Arthritis    Back pain    Finger pain    Hand pain    High triglycerides    Leg pain       ROS See HPI.    Objective:     BP 118/69   Pulse 99   Ht 5\' 2"  (1.575 m)   Wt 221 lb (100.2 kg)   SpO2 100%   BMI 40.42 kg/m  BP Readings from Last 3 Encounters:  03/05/22 118/69  11/14/21 135/86  05/09/21 131/71   Wt Readings from Last 3 Encounters:  03/05/22 221 lb (100.2 kg)  01/05/22 210 lb (95.3 kg)  11/14/21 220 lb (99.8 kg)  ..    03/05/2022    8:50 AM 11/14/2021   10:06 AM 05/09/2021   10:49 AM 05/09/2021   10:16 AM 11/01/2020   10:41 AM  Depression screen PHQ 2/9  Decreased Interest 0 0 0 0 0  Down, Depressed, Hopeless 0 0 0 0 0  PHQ - 2 Score 0 0 0 0 0  Altered sleeping 0    0  Tired, decreased energy 0    0  Change in appetite 0    1  Feeling bad or failure about yourself  0    0  Trouble concentrating 0    0  Moving slowly or fidgety/restless 0    0  Suicidal thoughts 0    0  PHQ-9 Score 0    1  Difficult doing work/chores Not difficult at all    Not difficult at all   ..    03/05/2022    8:51 AM 11/01/2020   10:42 AM 03/18/2020    7:38 AM 09/15/2019    9:11 AM  GAD 7 : Generalized Anxiety Score  Nervous, Anxious, on Edge 3 0 1 1  Control/stop worrying 3 0 2 2  Worry too much - different things 3 0 2 2  Trouble relaxing 3 0 1 1  Restless 2 0 1 1  Easily annoyed or irritable 2 0 1 1  Afraid - awful might happen 3 0 0 0  Total GAD 7 Score 19 0 8 8  Anxiety Difficulty Not difficult at all  Very difficult Not difficult at all        Physical Exam Constitutional:      Appearance: Normal appearance. She is obese.  HENT:     Head: Normocephalic.   Cardiovascular:     Rate and Rhythm: Normal rate and regular rhythm.  Pulmonary:     Effort: Pulmonary effort is normal.  Musculoskeletal:     Right lower leg: No edema.     Left lower leg: No edema.  Neurological:     General: No focal deficit present.     Mental Status: She is alert and oriented to person, place, and time.  Psychiatric:        Mood and Affect: Mood normal.        Assessment & Plan:  [name] KitchenMarland KitchenDiagnoses and all orders for this visit:  Mixed hyperlipidemia -     Lipid Panel w/reflex Direct LDL -     Cardio IQ (R) Advanced Lipid Panel  Mild intermittent asthma without complication -     albuterol (VENTOLIN HFA) 108 (90 Base) MCG/ACT inhaler; Inhale 2 puffs into the lungs every 4 (four) hours as needed for wheezing or shortness of breath.  Anxiety and depression -     busPIRone (BUSPAR) 15 MG tablet; Take 1 tablet (15 mg total) by mouth 3 (three) times daily.  Essential hypertension  Vitamin D deficiency -     VITAMIN D 25 Hydroxy (Vit-D Deficiency, Fractures)   Not quite time for labs Wait 1-2 months GAD number increased  Discussed anxiety Increased buspar Encouraged talk therapy Vitals look great Continue same medications  Spent 45 minutes with patient and reviewing chart as well as discussing with patient treatment plan.   Follow up in 3 months.      Iran Planas, PA-C

## 2022-03-10 NOTE — Telephone Encounter (Signed)
Sent!

## 2022-03-30 ENCOUNTER — Encounter: Payer: Self-pay | Admitting: Physician Assistant

## 2022-03-30 MED ORDER — DICYCLOMINE HCL 20 MG PO TABS: 20.0000 mg | ORAL_TABLET | Freq: Three times a day (TID) | ORAL | 1 refills | Status: DC

## 2022-03-30 NOTE — Telephone Encounter (Signed)
Bentyl for IBS diarrhea.

## 2022-04-01 ENCOUNTER — Other Ambulatory Visit: Payer: Self-pay | Admitting: Physician Assistant

## 2022-04-06 ENCOUNTER — Other Ambulatory Visit: Payer: Self-pay | Admitting: Physician Assistant

## 2022-04-06 DIAGNOSIS — J4520 Mild intermittent asthma, uncomplicated: Secondary | ICD-10-CM

## 2022-04-29 ENCOUNTER — Encounter: Payer: Self-pay | Admitting: Physician Assistant

## 2022-04-30 MED ORDER — LOMAIRA 8 MG PO TABS: 1.0000 | ORAL_TABLET | Freq: Three times a day (TID) | ORAL | 0 refills | Status: DC

## 2022-04-30 MED ORDER — VITAMIN D (ERGOCALCIFEROL) 1.25 MG (50000 UNIT) PO CAPS: 50000.0000 [IU] | ORAL_CAPSULE | ORAL | 1 refills | Status: DC

## 2022-05-18 ENCOUNTER — Other Ambulatory Visit: Payer: Self-pay | Admitting: Physician Assistant

## 2022-05-18 DIAGNOSIS — J4520 Mild intermittent asthma, uncomplicated: Secondary | ICD-10-CM

## 2022-05-23 ENCOUNTER — Encounter: Payer: Self-pay | Admitting: Physician Assistant

## 2022-05-23 DIAGNOSIS — M797 Fibromyalgia: Secondary | ICD-10-CM

## 2022-05-23 DIAGNOSIS — Z6841 Body Mass Index (BMI) 40.0 and over, adult: Secondary | ICD-10-CM

## 2022-05-25 MED ORDER — GABAPENTIN 300 MG PO CAPS: 300.0000 mg | ORAL_CAPSULE | Freq: Four times a day (QID) | ORAL | 1 refills | Status: DC

## 2022-05-25 MED ORDER — LOMAIRA 8 MG PO TABS: 1.0000 | ORAL_TABLET | Freq: Three times a day (TID) | ORAL | 0 refills | Status: DC

## 2022-06-01 ENCOUNTER — Encounter: Payer: Self-pay | Admitting: Physician Assistant

## 2022-06-01 ENCOUNTER — Telehealth (INDEPENDENT_AMBULATORY_CARE_PROVIDER_SITE_OTHER): Payer: 59 | Admitting: Physician Assistant

## 2022-06-01 DIAGNOSIS — J329 Chronic sinusitis, unspecified: Secondary | ICD-10-CM

## 2022-06-01 DIAGNOSIS — J40 Bronchitis, not specified as acute or chronic: Secondary | ICD-10-CM

## 2022-06-01 MED ORDER — AZITHROMYCIN 250 MG PO TABS: ORAL_TABLET | ORAL | 0 refills | Status: DC

## 2022-06-01 MED ORDER — METHYLPREDNISOLONE 4 MG PO TBPK: ORAL_TABLET | ORAL | 0 refills | Status: DC

## 2022-06-01 NOTE — Progress Notes (Signed)
..Virtual Visit via Video Note  I connected with Bridget Johnston on 06/01/22 at  2:40 PM EDT by a video enabled telemedicine application and verified that I am speaking with the correct person using two identifiers.  Location: Patient: work Provider: clinic  .[name] KitchenParticipating in visit:  Patient: Bridget Johnston Provider: Iran Planas PA-C   I discussed the limitations of evaluation and management by telemedicine and the availability of in person appointments. The patient expressed understanding and agreed to proceed.  History of Present Illness: Pt is a 62 yo female with asthma who calls into clinic for productive cough, sinus pressure, ear fullness, sinus drainage, chest tightness. Home covid test negative. No body aches. Taking OTC cold medications to include sudafed. Symptoms for last week. Using albuterol as needed with symbicort daily. No fever. Blowing out red and green discharge for last 2 days.   .. 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    Hyponatremia    Encephalitis    History of pneumonia 09/10/2016   History of acute respiratory failure 09/10/2016   Peripheral edema 09/10/2016   Awareness alteration, transient 10/12/2016   BMI 39.0-39.9,adult 11/03/2016   Prediabetes 01/01/2017   History of aphasia 01/01/2017   Class 2 obesity due to excess calories without serious comorbidity with body mass index (BMI) of 38.0 to 38.9 in adult 01/01/2017   Fatigue 01/01/2017   Iron deficiency  anemia secondary to inadequate dietary iron intake 11/18/2018   Word finding difficulty 09/18/2019   History of fracture 09/18/2019   Post-menopausal 09/18/2019   Chronic left-sided thoracic back pain 09/18/2019   Osteopenia 01/24/2020   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   Stress due to family tension 03/18/2020   Rib pain on right side 05/12/2021   Right foot pain 05/12/2021   Resolved Ambulatory Problems    Diagnosis Date Noted   UTI 10/08/2010   Acute respiratory failure with hypoxia ([place]) 06/19/2016   History of ischemic stroke in prior three months 06/21/2016   Diabetes mellitus type 2 in obese [facility])    Morbid obesity ([place])    Diabetes mellitus without complication ([place]) [id number]   Past Medical History:  Diagnosis Date   Arthritis    Back pain    Finger pain    Hand pain    High triglycerides    Leg pain     Observations/Objective: No acute distress Productive cough Normal mood and appearance  Assessment and Plan: [name] KitchenMarland KitchenKenitha was seen today for cough.  Diagnoses and all orders for this visit:  Sinobronchitis -     azithromycin (ZITHROMAX) 250 MG tablet; 2 tabs po on Day 1, then 1 tab daily Days 2 - 5 -     methylPREDNISolone (MEDROL DOSEPAK) 4 MG TBPK tablet; Take as directed by package insert.   Over a week of URI symptoms with ongoing asthma Treated for sinobronchitis with zpak/medrol dose pack Continue symptomatic care with sinus rinses and flonase and sudafed Follow up as needed or if symptoms worsen   Follow Up Instructions:    I discussed  the assessment and treatment plan with the patient. The patient was provided an opportunity to ask questions and all were answered. The patient agreed with the plan and demonstrated an understanding of the instructions.   The patient was advised to call back or seek an in-person evaluation if the symptoms worsen or if the condition fails to improve as anticipated.   Iran Planas, PA-C

## 2022-06-01 NOTE — Progress Notes (Signed)
Started last Thursday- Friday as allergy symptoms Saturday - SOB  Now: runny nose Cough - productive, dark green, pain in chest with coughing   Taking sudafed and benadryl - helped some   Covid test at home - negative this AM

## 2022-06-10 ENCOUNTER — Encounter: Payer: Self-pay | Admitting: Physician Assistant

## 2022-06-10 MED ORDER — AMOXICILLIN-POT CLAVULANATE 875-125 MG PO TABS: 1.0000 | ORAL_TABLET | Freq: Two times a day (BID) | ORAL | 0 refills | Status: DC

## 2022-06-12 ENCOUNTER — Ambulatory Visit: Payer: 59 | Admitting: Physician Assistant

## 2022-06-12 NOTE — Progress Notes (Signed)
Will discuss at next appt.  Cholesterol looks so much better!

## 2022-06-17 LAB — CARDIO IQ(R) ADVANCED LIPID PANEL
Apolipoprotein B: 66 mg/dL (ref <90)
Cholesterol: 147 mg/dL (ref <200)
HDL LARGE: 7478 nmol/L (ref >6729)
HDL: 62 mg/dL (ref >49)
LDL Cholesterol (Calc): 66 mg/dL (calc) (ref <100)
LDL Medium: 274 nmol/L — ABNORMAL HIGH (ref <215)
LDL Particle Number: 1297 nmol/L — ABNORMAL HIGH (ref <1138)
LDL Peak Size: 216.2 Angstrom — ABNORMAL LOW (ref >222.9)
LDL Small: 254 nmol/L — ABNORMAL HIGH (ref <142)
Lipoprotein (a): 62 nmol/L (ref <75)
Non-HDL Cholesterol (Calc): 85 mg/dL (calc) (ref <130)
Total CHOL/HDL Ratio: 2.4 calc (ref <5.0)
Triglycerides: 103 mg/dL (ref <150)

## 2022-06-17 LAB — LIPID PANEL W/REFLEX DIRECT LDL
Cholesterol: 147 mg/dL (ref <200)
HDL: 68 mg/dL (ref >=50)
LDL Cholesterol (Calc): 60 mg/dL (calc)
Non-HDL Cholesterol (Calc): 79 mg/dL (calc) (ref <130)
Total CHOL/HDL Ratio: 2.2 (calc) (ref <5.0)
Triglycerides: 104 mg/dL (ref <150)

## 2022-06-17 LAB — VITAMIN D 25 HYDROXY (VIT D DEFICIENCY, FRACTURES): Vit D, 25-Hydroxy: 36 ng/mL (ref 30–100)

## 2022-06-19 ENCOUNTER — Ambulatory Visit (INDEPENDENT_AMBULATORY_CARE_PROVIDER_SITE_OTHER): Payer: 59 | Admitting: Physician Assistant

## 2022-06-19 VITALS — BP 117/71 | HR 110 | Ht 62.0 in | Wt 223.0 lb

## 2022-06-19 DIAGNOSIS — F32A Depression, unspecified: Secondary | ICD-10-CM

## 2022-06-19 DIAGNOSIS — Z13820 Encounter for screening for osteoporosis: Secondary | ICD-10-CM

## 2022-06-19 DIAGNOSIS — E559 Vitamin D deficiency, unspecified: Secondary | ICD-10-CM

## 2022-06-19 DIAGNOSIS — R7303 Prediabetes: Secondary | ICD-10-CM

## 2022-06-19 DIAGNOSIS — E782 Mixed hyperlipidemia: Secondary | ICD-10-CM | POA: Diagnosis not present

## 2022-06-19 DIAGNOSIS — F419 Anxiety disorder, unspecified: Secondary | ICD-10-CM | POA: Diagnosis not present

## 2022-06-19 DIAGNOSIS — Z1231 Encounter for screening mammogram for malignant neoplasm of breast: Secondary | ICD-10-CM

## 2022-06-19 DIAGNOSIS — E8881 Metabolic syndrome: Secondary | ICD-10-CM

## 2022-06-19 DIAGNOSIS — R600 Localized edema: Secondary | ICD-10-CM

## 2022-06-19 DIAGNOSIS — G4700 Insomnia, unspecified: Secondary | ICD-10-CM

## 2022-06-19 LAB — POCT GLYCOSYLATED HEMOGLOBIN (HGB A1C): Hemoglobin A1C: 5.3 % (ref 4.0–5.6)

## 2022-06-19 MED ORDER — VILAZODONE HCL 40 MG PO TABS: 40.0000 mg | ORAL_TABLET | Freq: Every day | ORAL | 3 refills | Status: DC

## 2022-06-19 MED ORDER — OZEMPIC (0.25 OR 0.5 MG/DOSE) 2 MG/3ML ~~LOC~~ SOPN
0.5000 mg | PEN_INJECTOR | SUBCUTANEOUS | 0 refills | Status: DC
Start: 2022-06-19 — End: 2022-07-14

## 2022-06-19 MED ORDER — FUROSEMIDE 20 MG PO TABS
20.0000 mg | ORAL_TABLET | Freq: Every day | ORAL | 5 refills | Status: DC | PRN
Start: 2022-06-19 — End: 2022-12-16

## 2022-06-19 MED ORDER — TRAZODONE HCL 50 MG PO TABS
25.0000 mg | ORAL_TABLET | Freq: Every evening | ORAL | 2 refills | Status: DC | PRN
Start: 2022-06-19 — End: 2022-09-18

## 2022-06-19 NOTE — Patient Instructions (Addendum)
Trial of trazodone for sleep Ok to start co-enzyme 10 Order bone density/mammogram Continue vitamin D and take with dairy/nuts/eggs Get calcium in diet  Ozempic will try to get filled.

## 2022-06-19 NOTE — Progress Notes (Deleted)
Complete physical exam  Patient: Bridget Johnston   DOB: 11-22-1960   62 y.o. Female  MRN: [id number]  Subjective:    No chief complaint on file.   Bridget Johnston is a 62 y.o. female who presents today for a complete physical exam. She reports consuming a {diet types:17450} diet. {types:19826} She generally feels {DESC; WELL/FAIRLY WELL/POORLY:18703}. She reports sleeping {DESC; WELL/FAIRLY WELL/POORLY:18703}. She {does/does not:200015} have additional problems to discuss today.   Sleep 6 months   A1c 5.3 Ozempic- Most recent fall risk assessment:    03/05/2022    8:50 AM  Fall Risk   Falls in the past year? 0  Number falls in past yr: 0  Injury with Fall? 0  Risk for fall due to : No Fall Risks  Follow up Falls evaluation completed     Most recent depression screenings:    03/05/2022    8:50 AM 11/14/2021   10:06 AM  PHQ 2/9 Scores  PHQ - 2 Score 0 0  PHQ- 9 Score 0     {VISON DENTAL STD PSA (Optional):27386}  {History (Optional):23778}  Patient Care Team: Nolene Ebbs as PCP - General (Family Medicine)   Outpatient Medications Prior to Visit  Medication Sig   amoxicillin-clavulanate (AUGMENTIN) 875-125 MG tablet Take 1 tablet by mouth 2 (two) times daily.   albuterol (VENTOLIN HFA) 108 (90 Base) MCG/ACT inhaler INHALE 2 PUFFS INTO THE LUNGS EVERY 4 HOURS AS NEEDED FOR WHEEZING OR SHORTNESS OF BREATH   AMBULATORY NON FORMULARY MEDICATION Take 1 tablet by mouth daily. Patient reports taking wheat grass to help control her glucose.   atorvastatin (LIPITOR) 40 MG tablet TAKE 1 TABLET(40 MG) BY MOUTH DAILY   azithromycin (ZITHROMAX) 250 MG tablet 2 tabs po on Day 1, then 1 tab daily Days 2 - 5   Blood Glucose Monitoring Suppl (ONE TOUCH ULTRA MINI) w/Device KIT Check fasting blood sugar daily.   budesonide-formoterol (SYMBICORT) 160-4.5 MCG/ACT inhaler INHALE 2 PUFFS INTO THE LUNGS TWICE DAILY   busPIRone (BUSPAR) 15 MG tablet Take 1 tablet (15 mg total) by  mouth 3 (three) times daily.   dicyclomine (BENTYL) 20 MG tablet Take 1 tablet (20 mg total) by mouth 3 (three) times daily before meals.   gabapentin (NEURONTIN) 300 MG capsule Take 1 capsule (300 mg total) by mouth 4 (four) times daily.   Lancets (ONETOUCH DELICA PLUS LANCET30G) MISC To check blood sugars up to 4 times daily   Loratadine 10 MG CAPS    methylPREDNISolone (MEDROL DOSEPAK) 4 MG TBPK tablet Take as directed by package insert.   omega-3 acid ethyl esters (LOVAZA) 1 g capsule Take 2 capsules (2 g total) by mouth 2 (two) times daily.   ONETOUCH VERIO test strip USE TO TEST BLOOD SUGAR DAILY   Phentermine HCl (LOMAIRA) 8 MG TABS Take 1 tablet (8 mg total) by mouth 3 (three) times daily before meals.   Vilazodone HCl (VIIBRYD) 40 MG TABS Take 1 tablet (40 mg total) by mouth daily.   Vitamin D, Ergocalciferol, (DRISDOL) 1.25 MG (50000 UNIT) CAPS capsule Take 1 capsule (50,000 Units total) by mouth every 7 (seven) days. Take for 8 total doses(weeks)   zonisamide (ZONEGRAN) 100 MG capsule TAKE 2 CAPSULES(200 MG) BY MOUTH AT BEDTIME   No facility-administered medications prior to visit.    ROS        Objective:     There were no vitals taken for this visit. {Vitals History (Optional):23777}  Physical  Exam   No results found for any visits on 06/19/22. {Show previous labs (optional):23779}    Assessment & Plan:    Routine Health Maintenance and Physical Exam  Immunization History  Administered Date(s) Administered   Influenza Split 12/04/2013   Influenza,inj,Quad PF,6+ Mos 11/02/2016, 11/24/2017, 11/16/2018   Influenza-Unspecified 10/31/2015, 11/16/2018   Pneumococcal Polysaccharide-23 01/01/2017   Tdap 10/30/2014    Health Maintenance  Topic Date Due   Zoster Vaccines- Shingrix (1 of 2) Never done   PAP SMEAR-Modifier  11/15/2021   INFLUENZA VACCINE  10/08/2022   MAMMOGRAM  04/04/2023   DTaP/Tdap/Td (2 - Td or Tdap) 10/29/2024   COLONOSCOPY (Pts 45-46yrs  Insurance coverage will need to be confirmed)  04/06/2026   Hepatitis C Screening  Completed   HIV Screening  Completed   HPV VACCINES  Aged Out   COVID-19 Vaccine  Discontinued    Discussed health benefits of physical activity, and encouraged her to engage in regular exercise appropriate for her age and condition.  Problem List Items Addressed This Visit       Unprioritized   Prediabetes - Primary   Relevant Orders   POCT HgB A1C   No follow-ups on file.     Tandy Gaw, PA-C

## 2022-06-20 ENCOUNTER — Telehealth: Payer: Self-pay

## 2022-06-20 ENCOUNTER — Encounter: Payer: Self-pay | Admitting: Physician Assistant

## 2022-06-20 NOTE — Telephone Encounter (Signed)
Initiated Prior authorization QDI:YMEBRAX (0.25 or 0.5 MG/DOSE) 2MG /3ML pen-injectors Via: Covermymeds Case/Key:[id number] Status: approved as of 06/20/22 Reason:Date:06/20/2023; Notified Pt via: Mychart

## 2022-06-20 NOTE — Progress Notes (Signed)
Established Patient Office Visit  Subjective   Patient ID: Bridget Johnston, female    DOB: November 30, 1960  Age: 62 y.o. MRN: [id number]  Chief Complaint  Patient presents with   Follow-up         HPI Pt is a 62 yo obese female with HTN, Asthma, HLD, vitamin D deficiency, anxiety and depression who presents to the clinic to go over labs.   Bridget Johnston has been eating healthy and more active since her husbands MI. Bridget Johnston wants to go over labs. Bridget Johnston is frustrated that Bridget Johnston is not losing weight.   Bridget Johnston is having more trouble in the last 3-4 months sleeping. Bridget Johnston can go to sleep but cannot stay asleep. Bridget Johnston has been taking benadryl and helping but seems to be not helping as much.  .. 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    Hyponatremia    Encephalitis    History of pneumonia 09/10/2016   History of acute respiratory failure 09/10/2016   Peripheral edema 09/10/2016   Awareness alteration, transient 10/12/2016   BMI 39.0-39.9,adult 11/03/2016   Prediabetes 01/01/2017   History of aphasia 01/01/2017   Class 2 obesity due to excess calories without serious comorbidity with body mass index (BMI) of 38.0 to 38.9 in adult 01/01/2017   Fatigue 01/01/2017   Iron deficiency anemia secondary to inadequate dietary iron intake 11/18/2018   Word finding difficulty 09/18/2019   History of fracture 09/18/2019   Post-menopausal 09/18/2019   Chronic left-sided thoracic back pain 09/18/2019   Osteopenia  01/24/2020   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   Stress due to family tension 03/18/2020   Rib pain on right side 05/12/2021   Right foot pain 05/12/2021   Resolved Ambulatory Problems    Diagnosis Date Noted   UTI 10/08/2010   Acute respiratory failure with hypoxia 06/19/2016   History of ischemic stroke in prior three months 06/21/2016   Diabetes mellitus type 2 in obese    Morbid obesity    Diabetes mellitus without complication 09/03/2017   Past Medical History:  Diagnosis Date   Arthritis    Back pain    Finger pain    Hand pain    High triglycerides    Leg pain      ROS See HPI.    Objective:     BP 117/71   Pulse (!) 110   Ht  (1.575 m)   Wt 223 lb (101.2 kg)   SpO2 97%   BMI 40.79 kg/m  BP Readings from Last 3 Encounters:  06/19/22 117/71  03/05/22 118/69  11/14/21 135/86   Wt Readings from Last 3 Encounters:  06/19/22 223 lb (101.2 kg)  03/05/22 221 lb (100.2 kg)  01/05/22 210 lb (95.3 kg)      Physical Exam Constitutional:      Appearance: Normal appearance. Bridget Johnston is obese.  HENT:     Head: Normocephalic.  Cardiovascular:     Rate and Rhythm: Normal rate and regular rhythm.     Pulses: Normal pulses.  Heart sounds: Normal heart sounds.  Pulmonary:     Effort: Pulmonary effort is normal.     Breath sounds: Normal breath sounds.  Neurological:     Mental Status: Bridget Johnston is alert and oriented to person, place, and time.  Psychiatric:        Mood and Affect: Mood normal.      Results for orders placed or performed in visit on 06/19/22  POCT HgB A1C  Result Value Ref Range   Hemoglobin A1C 5.3 4.0 - 5.6 %   HbA1c POC (<> result, manual entry)     HbA1c, POC (prediabetic range)     HbA1c, POC (controlled diabetic range)         Assessment & Plan:  [name] KitchenMarland KitchenEvva was seen today for follow-up.  Diagnoses and all orders for this visit:  Prediabetes -     POCT HgB A1C -     Semaglutide,0.25 or 0.5MG /DOS, (OZEMPIC, 0.25 OR 0.5 MG/DOSE,) 2  MG/3ML SOPN; Inject 0.5 mg into the skin once a week.  Visit for screening mammogram -     MM 3D SCREENING MAMMOGRAM BILATERAL BREAST -     Semaglutide,0.25 or 0.5MG /DOS, (OZEMPIC, 0.25 OR 0.5 MG/DOSE,) 2 MG/3ML SOPN; Inject 0.5 mg into the skin once a week.  Osteoporosis screening -     DG Bone Density; Future -     Semaglutide,0.25 or 0.5MG /DOS, (OZEMPIC, 0.25 OR 0.5 MG/DOSE,) 2 MG/3ML SOPN; Inject 0.5 mg into the skin once a week.  Metabolic syndrome -     Semaglutide,0.25 or 0.5MG /DOS, (OZEMPIC, 0.25 OR 0.5 MG/DOSE,) 2 MG/3ML SOPN; Inject 0.5 mg into the skin once a week.  Mixed hyperlipidemia -     Semaglutide,0.25 or 0.5MG /DOS, (OZEMPIC, 0.25 OR 0.5 MG/DOSE,) 2 MG/3ML SOPN; Inject 0.5 mg into the skin once a week.  Vitamin D deficiency -     Semaglutide,0.25 or 0.5MG /DOS, (OZEMPIC, 0.25 OR 0.5 MG/DOSE,) 2 MG/3ML SOPN; Inject 0.5 mg into the skin once a week.  Anxiety and depression -     Semaglutide,0.25 or 0.5MG /DOS, (OZEMPIC, 0.25 OR 0.5 MG/DOSE,) 2 MG/3ML SOPN; Inject 0.5 mg into the skin once a week. -     Vilazodone HCl (VIIBRYD) 40 MG TABS; Take 1 tablet (40 mg total) by mouth daily.  Insomnia, unspecified type -     traZODone (DESYREL) 50 MG tablet; Take 0.5-1 tablets (25-50 mg total) by mouth at bedtime as needed for sleep. -     Semaglutide,0.25 or 0.5MG /DOS, (OZEMPIC, 0.25 OR 0.5 MG/DOSE,) 2 MG/3ML SOPN; Inject 0.5 mg into the skin once a week.  Bilateral lower extremity edema -     furosemide (LASIX) 20 MG tablet; Take 1 tablet (20 mg total) by mouth daily as needed for fluid or edema. -     Semaglutide,0.25 or 0.5MG /DOS, (OZEMPIC, 0.25 OR 0.5 MG/DOSE,) 2 MG/3ML SOPN; Inject 0.5 mg into the skin once a week.   A1C 5.3 which is great and out of pre-diabetes range BP looks great Vitamin D still a little low-continue high dose and discuss absorption Trial of taking after dinner with no other medication  Discussed LDL and HDL look good but Bridget Johnston does have pattern B and  small LDL particles which increases risk of CVD.  Stay on statin Start co-enzyme 10  .[name] KitchenDiscussed low carb diet with 1500 calories and 80g of protein.  Exercising at least 150 minutes a week.  [employer] could be a Chief Technology Officer.  Due to metabolic syndrome and increase CV risk  Start ozempic Discussed titration up Sample pen of .25mg  weekly Start .5mg  weekly  Bone density and mammogram ordered    Return in about 3 months (around 09/18/2022).    Tandy Gaw, PA-C

## 2022-06-22 ENCOUNTER — Encounter: Payer: Self-pay | Admitting: Physician Assistant

## 2022-06-22 MED ORDER — FLUCONAZOLE 150 MG PO TABS: 150.0000 mg | ORAL_TABLET | Freq: Once | ORAL | 0 refills | Status: AC

## 2022-06-25 ENCOUNTER — Encounter: Payer: Self-pay | Admitting: Physician Assistant

## 2022-06-26 MED ORDER — FLUCONAZOLE 150 MG PO TABS: 150.0000 mg | ORAL_TABLET | Freq: Once | ORAL | 0 refills | Status: AC

## 2022-07-13 ENCOUNTER — Other Ambulatory Visit: Payer: Self-pay | Admitting: Physician Assistant

## 2022-07-13 ENCOUNTER — Encounter: Payer: Self-pay | Admitting: Physician Assistant

## 2022-07-13 DIAGNOSIS — Z1231 Encounter for screening mammogram for malignant neoplasm of breast: Secondary | ICD-10-CM

## 2022-07-13 DIAGNOSIS — R7303 Prediabetes: Secondary | ICD-10-CM

## 2022-07-13 DIAGNOSIS — E559 Vitamin D deficiency, unspecified: Secondary | ICD-10-CM

## 2022-07-13 DIAGNOSIS — F32A Depression, unspecified: Secondary | ICD-10-CM

## 2022-07-13 DIAGNOSIS — E782 Mixed hyperlipidemia: Secondary | ICD-10-CM

## 2022-07-13 DIAGNOSIS — E8881 Metabolic syndrome: Secondary | ICD-10-CM

## 2022-07-13 DIAGNOSIS — R600 Localized edema: Secondary | ICD-10-CM

## 2022-07-13 DIAGNOSIS — F419 Anxiety disorder, unspecified: Secondary | ICD-10-CM

## 2022-07-13 DIAGNOSIS — G4700 Insomnia, unspecified: Secondary | ICD-10-CM

## 2022-07-13 DIAGNOSIS — Z13820 Encounter for screening for osteoporosis: Secondary | ICD-10-CM

## 2022-07-20 ENCOUNTER — Encounter: Payer: Self-pay | Admitting: Physician Assistant

## 2022-07-20 DIAGNOSIS — J4520 Mild intermittent asthma, uncomplicated: Secondary | ICD-10-CM

## 2022-07-20 MED ORDER — BUDESONIDE-FORMOTEROL FUMARATE 160-4.5 MCG/ACT IN AERO
2.0000 | INHALATION_SPRAY | Freq: Two times a day (BID) | RESPIRATORY_TRACT | 4 refills | Status: DC
Start: 2022-07-20 — End: 2022-08-18

## 2022-07-29 ENCOUNTER — Ambulatory Visit (INDEPENDENT_AMBULATORY_CARE_PROVIDER_SITE_OTHER): Payer: 59

## 2022-07-29 DIAGNOSIS — Z780 Asymptomatic menopausal state: Secondary | ICD-10-CM | POA: Diagnosis not present

## 2022-07-29 DIAGNOSIS — Z13820 Encounter for screening for osteoporosis: Secondary | ICD-10-CM

## 2022-07-29 DIAGNOSIS — Z1231 Encounter for screening mammogram for malignant neoplasm of breast: Secondary | ICD-10-CM | POA: Diagnosis not present

## 2022-07-29 NOTE — Progress Notes (Signed)
Bone density osteopenic, low bone mass.  Continue vitamin D and calcium for prevention. Recheck in 2 years.

## 2022-08-10 ENCOUNTER — Encounter: Payer: Self-pay | Admitting: Physician Assistant

## 2022-08-10 MED ORDER — SEMAGLUTIDE (2 MG/DOSE) 8 MG/3ML ~~LOC~~ SOPN: 2.0000 mg | PEN_INJECTOR | SUBCUTANEOUS | 2 refills | Status: DC

## 2022-08-14 NOTE — Progress Notes (Signed)
Normal mammogram. Follow up in 1 year.

## 2022-08-18 ENCOUNTER — Encounter: Payer: Self-pay | Admitting: Physician Assistant

## 2022-08-18 MED ORDER — BUDESONIDE-FORMOTEROL FUMARATE 160-4.5 MCG/ACT IN AERO: 2.0000 | INHALATION_SPRAY | Freq: Two times a day (BID) | RESPIRATORY_TRACT | 4 refills | Status: DC

## 2022-08-30 ENCOUNTER — Encounter: Payer: Self-pay | Admitting: Physician Assistant

## 2022-08-30 DIAGNOSIS — F419 Anxiety disorder, unspecified: Secondary | ICD-10-CM

## 2022-08-31 MED ORDER — BUSPIRONE HCL 15 MG PO TABS
15.0000 mg | ORAL_TABLET | Freq: Three times a day (TID) | ORAL | 1 refills | Status: DC
Start: 2022-08-31 — End: 2022-12-25

## 2022-09-04 ENCOUNTER — Other Ambulatory Visit: Payer: Self-pay | Admitting: Physician Assistant

## 2022-09-04 DIAGNOSIS — R4701 Aphasia: Secondary | ICD-10-CM

## 2022-09-04 DIAGNOSIS — R404 Transient alteration of awareness: Secondary | ICD-10-CM

## 2022-09-04 MED ORDER — ZONISAMIDE 100 MG PO CAPS
ORAL_CAPSULE | ORAL | 1 refills | Status: DC
Start: 2022-09-04 — End: 2022-12-25

## 2022-09-18 ENCOUNTER — Encounter: Payer: Self-pay | Admitting: Physician Assistant

## 2022-09-18 ENCOUNTER — Ambulatory Visit (INDEPENDENT_AMBULATORY_CARE_PROVIDER_SITE_OTHER): Payer: 59 | Admitting: Physician Assistant

## 2022-09-18 VITALS — BP 106/63 | HR 80 | Temp 98.7°F | Wt 212.0 lb

## 2022-09-18 DIAGNOSIS — F32A Depression, unspecified: Secondary | ICD-10-CM

## 2022-09-18 DIAGNOSIS — J4520 Mild intermittent asthma, uncomplicated: Secondary | ICD-10-CM

## 2022-09-18 DIAGNOSIS — F419 Anxiety disorder, unspecified: Secondary | ICD-10-CM | POA: Diagnosis not present

## 2022-09-18 DIAGNOSIS — R7303 Prediabetes: Secondary | ICD-10-CM

## 2022-09-18 DIAGNOSIS — G4700 Insomnia, unspecified: Secondary | ICD-10-CM

## 2022-09-18 DIAGNOSIS — E8881 Metabolic syndrome: Secondary | ICD-10-CM

## 2022-09-18 DIAGNOSIS — E782 Mixed hyperlipidemia: Secondary | ICD-10-CM

## 2022-09-18 DIAGNOSIS — E66812 Obesity, class 2: Secondary | ICD-10-CM

## 2022-09-18 DIAGNOSIS — I5022 Chronic systolic (congestive) heart failure: Secondary | ICD-10-CM

## 2022-09-18 LAB — POCT GLYCOSYLATED HEMOGLOBIN (HGB A1C): Hemoglobin A1C: 5.2 % (ref 4.0–5.6)

## 2022-09-18 MED ORDER — VITAMIN D (ERGOCALCIFEROL) 1.25 MG (50000 UNIT) PO CAPS: 50000.0000 [IU] | ORAL_CAPSULE | ORAL | 1 refills | Status: DC

## 2022-09-18 NOTE — Progress Notes (Signed)
Established Patient Office Visit  Subjective   Patient ID: Bridget Johnston, female    DOB: 07/29/1960  Age: 62 y.o. MRN: [id number]  Chief Complaint  Patient presents with   Follow-up    HPI Pt is a 62 yo [place] female with metabolic syndrome, CHF, HLD, Asthma, MDD, GAD who presents to the clinic for follow up.   She is doing really well. She is exercising and eating very healthy. She has lost 15lbs in 3 months. She continues on ozempic weekly. She denies any CP, palpitations, headaches, vision changes, SOB or swelling. She is compliant with all medications. Her mood is well controlled. She is very happy.     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    Hyponatremia    Class 2 severe obesity due to excess calories with serious comorbidity and body mass index (BMI) of 38.0 to 38.9 in adult [facility])    Encephalitis    History of pneumonia 09/10/2016   History of acute respiratory failure 09/10/2016   Peripheral edema 09/10/2016   Awareness alteration, transient 10/12/2016   BMI 39.0-39.9,adult 11/03/2016   Prediabetes 01/01/2017   History of aphasia 01/01/2017   Class 2 obesity due to excess calories without serious comorbidity with body mass index (BMI) of 38.0 to 38.9 in adult 01/01/2017   Fatigue 01/01/2017   Iron deficiency anemia secondary to inadequate dietary iron intake 11/18/2018   Word finding difficulty 09/18/2019   History of fracture 09/18/2019   Post-menopausal  09/18/2019   Chronic left-sided thoracic back pain 09/18/2019   Osteopenia 01/24/2020   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   Stress due to family tension 03/18/2020   Rib pain on right side 05/12/2021   Right foot pain 05/12/2021   Chronic systolic (congestive) heart failure ([facility]) 09/18/2022   Resolved Ambulatory Problems    Diagnosis Date Noted   UTI 10/08/2010   Acute respiratory failure with hypoxia ([facility]) 06/19/2016   History of ischemic stroke in prior three months 06/21/2016   Diabetes mellitus type 2 in obese    Diabetes mellitus without complication ([facility]) 09/03/2017   Past Medical History:  Diagnosis Date   Arthritis    Back pain    Finger pain    Hand pain    High triglycerides    Leg pain      ROS See HPI.    Objective:     BP 106/63 (BP Location: Left Arm, Patient Position: Sitting, Cuff Size: Normal)   Pulse 80   Temp 98.7 F (37.1 C)   Wt 212 lb 0.6 oz (96.2 kg)   SpO2 96%   BMI 38.78 kg/m  BP Readings from Last 3 Encounters:  09/18/22 106/63  06/19/22 117/71  03/05/22 118/69   Wt Readings from Last 3 Encounters:  09/18/22 212 lb 0.6 oz (96.2 kg)  06/19/22 223 lb (101.2 kg)  03/05/22 221 lb (100.2 kg)    .[name] Kitchen Results for orders placed or performed in visit on 09/18/22  POCT glycosylated hemoglobin (Hb A1C)  Result Value Ref  Range   Hemoglobin A1C 5.2 4.0 - 5.6 %   HbA1c POC (<> result, manual entry)     HbA1c, POC (prediabetic range)     HbA1c, POC (controlled diabetic range)     ..    06/19/2022    8:50 AM 03/05/2022    8:50 AM 11/14/2021   10:06 AM 05/09/2021   10:49 AM 05/09/2021   10:16 AM  Depression screen PHQ 2/9  Decreased Interest 0 0 0 0 0  Down, Depressed, Hopeless 0 0 0 0 0  PHQ - 2 Score 0 0 0 0 0  Altered sleeping 3 0     Tired, decreased energy 1 0     Change in appetite 0 0     Feeling bad or failure about yourself  0 0     Trouble concentrating 0 0     Moving slowly or fidgety/restless 0 0     Suicidal thoughts 0 0      PHQ-9 Score 4 0     Difficult doing work/chores Not difficult at all Not difficult at all      ..    06/19/2022    9:00 AM 03/05/2022    8:51 AM 11/01/2020   10:42 AM 03/18/2020    7:38 AM  GAD 7 : Generalized Anxiety Score  Nervous, Anxious, on Edge 2 3 0 1  Control/stop worrying 3 3 0 2  Worry too much - different things 3 3 0 2  Trouble relaxing 0 3 0 1  Restless 1 2 0 1  Easily annoyed or irritable 1 2 0 1  Afraid - awful might happen 2 3 0 0  Total GAD 7 Score 12 19 0 8  Anxiety Difficulty Somewhat difficult Not difficult at all  Very difficult      Physical Exam Constitutional:      Appearance: Normal appearance. She is obese.  HENT:     Head: Normocephalic.  Cardiovascular:     Rate and Rhythm: Normal rate and regular rhythm.     Pulses: Normal pulses.  Pulmonary:     Effort: Pulmonary effort is normal.     Breath sounds: Normal breath sounds.  Musculoskeletal:     Right lower leg: No edema.     Left lower leg: No edema.  Neurological:     General: No focal deficit present.     Mental Status: She is alert and oriented to person, place, and time.  Psychiatric:        Mood and Affect: Mood normal.         Assessment & Plan:  [name] KitchenMarland KitchenBethine was seen today for follow-up.  Diagnoses and all orders for this visit:  Metabolic syndrome  Prediabetes -     POCT glycosylated hemoglobin (Hb A1C)  Anxiety and depression  Mixed hyperlipidemia  Mild intermittent asthma without complication  Insomnia, unspecified type  Chronic systolic (congestive) heart failure (HCC)  Class 2 severe obesity due to excess calories with serious comorbidity and body mass index (BMI) of 38.0 to 38.9 in adult [facility])  Other orders -     Vitamin D, Ergocalciferol, (DRISDOL) 1.25 MG (50000 UNIT) CAPS capsule; Take 1 capsule (50,000 Units total) by mouth every 7 (seven) days. Take for 8 total doses(weeks)   Pt is doing great. A1C is 5.2.  She has lost 15lbs with diet changes and on  ozempic She is compliant with medication BP looks great on BB Not able to tolerate jardiance and declined entresto  EF 49 percent, seeing cardiology every 6 months  asymptomatic On statin Follow up in 3 months  Mood is great, breathing great no concerns.     Return in about 3 months (around 12/19/2022).    Tandy Gaw, PA-C

## 2022-09-23 ENCOUNTER — Telehealth: Payer: Self-pay

## 2022-09-23 ENCOUNTER — Other Ambulatory Visit: Payer: Self-pay | Admitting: Physician Assistant

## 2022-09-23 NOTE — Telephone Encounter (Signed)
Patient's spouse came into office to drop off Spouse Physical Verification Form, forms placed in Jade's box, thanks.

## 2022-10-16 ENCOUNTER — Other Ambulatory Visit: Payer: Self-pay | Admitting: *Deleted

## 2022-10-16 MED ORDER — OMEGA-3-ACID ETHYL ESTERS 1 G PO CAPS: 2.0000 | ORAL_CAPSULE | Freq: Two times a day (BID) | ORAL | 3 refills | Status: DC

## 2022-10-21 ENCOUNTER — Encounter: Payer: Self-pay | Admitting: Physician Assistant

## 2022-10-30 ENCOUNTER — Other Ambulatory Visit: Payer: Self-pay | Admitting: Physician Assistant

## 2022-11-29 ENCOUNTER — Other Ambulatory Visit: Payer: Self-pay | Admitting: Physician Assistant

## 2022-11-29 DIAGNOSIS — M797 Fibromyalgia: Secondary | ICD-10-CM

## 2022-12-16 ENCOUNTER — Other Ambulatory Visit: Payer: Self-pay | Admitting: Physician Assistant

## 2022-12-16 DIAGNOSIS — R600 Localized edema: Secondary | ICD-10-CM

## 2022-12-18 ENCOUNTER — Other Ambulatory Visit: Payer: Self-pay | Admitting: Physician Assistant

## 2022-12-25 ENCOUNTER — Encounter: Payer: Self-pay | Admitting: Physician Assistant

## 2022-12-25 ENCOUNTER — Ambulatory Visit (INDEPENDENT_AMBULATORY_CARE_PROVIDER_SITE_OTHER): Payer: 59 | Admitting: Physician Assistant

## 2022-12-25 VITALS — BP 108/67 | HR 76 | Ht 62.0 in | Wt 203.0 lb

## 2022-12-25 DIAGNOSIS — E782 Mixed hyperlipidemia: Secondary | ICD-10-CM

## 2022-12-25 DIAGNOSIS — R4701 Aphasia: Secondary | ICD-10-CM

## 2022-12-25 DIAGNOSIS — I5022 Chronic systolic (congestive) heart failure: Secondary | ICD-10-CM

## 2022-12-25 DIAGNOSIS — R7303 Prediabetes: Secondary | ICD-10-CM | POA: Diagnosis not present

## 2022-12-25 DIAGNOSIS — E66812 Obesity, class 2: Secondary | ICD-10-CM

## 2022-12-25 DIAGNOSIS — E8881 Metabolic syndrome: Secondary | ICD-10-CM

## 2022-12-25 DIAGNOSIS — L209 Atopic dermatitis, unspecified: Secondary | ICD-10-CM

## 2022-12-25 DIAGNOSIS — R4789 Other speech disturbances: Secondary | ICD-10-CM

## 2022-12-25 DIAGNOSIS — E559 Vitamin D deficiency, unspecified: Secondary | ICD-10-CM

## 2022-12-25 DIAGNOSIS — F419 Anxiety disorder, unspecified: Secondary | ICD-10-CM | POA: Diagnosis not present

## 2022-12-25 DIAGNOSIS — F32A Depression, unspecified: Secondary | ICD-10-CM

## 2022-12-25 DIAGNOSIS — R404 Transient alteration of awareness: Secondary | ICD-10-CM

## 2022-12-25 LAB — POCT GLYCOSYLATED HEMOGLOBIN (HGB A1C): Hemoglobin A1C: 4.8 % (ref 4.0–5.6)

## 2022-12-25 MED ORDER — METOPROLOL SUCCINATE ER 25 MG PO TB24
25.0000 mg | ORAL_TABLET | Freq: Every day | ORAL | 3 refills | Status: DC
Start: 2022-12-25 — End: 2023-12-29

## 2022-12-25 MED ORDER — ZONISAMIDE 100 MG PO CAPS
ORAL_CAPSULE | ORAL | 1 refills | Status: DC
Start: 2022-12-25 — End: 2023-07-02

## 2022-12-25 MED ORDER — TRIAMCINOLONE ACETONIDE 0.1 % EX CREA
1.0000 | TOPICAL_CREAM | Freq: Two times a day (BID) | CUTANEOUS | 1 refills | Status: DC
Start: 2022-12-25 — End: 2023-02-11

## 2022-12-25 MED ORDER — OZEMPIC (2 MG/DOSE) 8 MG/3ML ~~LOC~~ SOPN: PEN_INJECTOR | SUBCUTANEOUS | 1 refills | Status: DC

## 2022-12-25 MED ORDER — ATORVASTATIN CALCIUM 40 MG PO TABS
40.0000 mg | ORAL_TABLET | Freq: Every day | ORAL | 3 refills | Status: DC
Start: 2022-12-25 — End: 2023-12-29

## 2022-12-25 MED ORDER — BUSPIRONE HCL 15 MG PO TABS: 15.0000 mg | ORAL_TABLET | Freq: Three times a day (TID) | ORAL | 1 refills | Status: DC

## 2022-12-25 NOTE — Progress Notes (Signed)
Established Patient Office Visit  Subjective   Patient ID: Bridget Johnston, female    DOB: Oct 19, 1960  Age: 62 y.o. MRN: [id number]  Chief Complaint  Patient presents with   Medical Management of Chronic Issues    3 mo fup med refills, pre diabetes     HPI  Bridget Johnston is a 62 year old female with asthma, CHF, HLD, MDD, and GAD who presents to the clinic for follow up. She is doing well and has no complaints today. She has been compliant with all her medications. She has lost about 8 pounds in the last 3 months with Ozempic and staying active. She is takes her albuterol usually just with extremes of temperature. She states her mood has been good. She has been taking vitamin D daily and is wanting to get those levels checked today. Denies SOB/CP/palpitations.  Request as needed triamcinolone for her itchy rashes as needed.   .. 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    Hyponatremia    Class 2 severe obesity due to excess calories with serious comorbidity and body mass index (BMI) of 38.0 to 38.9 in adult [facility])    Encephalitis    History of pneumonia 09/10/2016   History of acute respiratory failure 09/10/2016   Peripheral edema 09/10/2016   Awareness alteration, transient 10/12/2016   BMI 39.0-39.9,adult 11/03/2016   Prediabetes 01/01/2017   History of aphasia 01/01/2017   Class 2 obesity due to excess calories without serious comorbidity with body mass index (BMI) of 38.0 to  38.9 in adult 01/01/2017   Fatigue 01/01/2017   Iron deficiency anemia secondary to inadequate dietary iron intake 11/18/2018   Word finding difficulty 09/18/2019   History of fracture 09/18/2019   Post-menopausal 09/18/2019   Chronic left-sided thoracic back pain 09/18/2019   Osteopenia 01/24/2020   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   Stress due to family tension 03/18/2020   Rib pain on right side 05/12/2021   Right foot pain 05/12/2021   Chronic systolic (congestive) heart failure (HCC) 09/18/2022   Resolved Ambulatory Problems    Diagnosis Date Noted   UTI 10/08/2010   Acute respiratory failure with hypoxia (HCC) 06/19/2016   History of ischemic stroke in prior three months 06/21/2016   Type 2 diabetes mellitus with obesity (HCC)    Diabetes mellitus without complication (HCC) 09/03/2017   Past Medical History:  Diagnosis Date   Arthritis    Back pain    Finger pain    Hand pain    High triglycerides    Leg pain      ROS See HPI   Objective:     BP 108/67   Pulse 76   Ht 5\' 2"  (1.575 m)   Wt 203 lb (92.1 kg)   SpO2 99%   BMI 37.13 kg/m  BP Readings from Last 3 Encounters:  12/25/22 108/67  09/18/22 106/63  06/19/22 117/71   Wt Readings from Last 3 Encounters:  12/25/22 203 lb (92.1 kg)  09/18/22 212 lb 0.6 oz (  96.2 kg)  06/19/22 223 lb (101.2 kg)    .[name] Kitchen Results for orders placed or performed in visit on 12/25/22  POCT HgB A1C  Result Value Ref Range   Hemoglobin A1C 4.8 4.0 - 5.6 %   HbA1c POC (<> result, manual entry)     HbA1c, POC (prediabetic range)     HbA1c, POC (controlled diabetic range)     ..    12/25/2022    9:38 AM 06/19/2022    8:50 AM 03/05/2022    8:50 AM 11/14/2021   10:06 AM 05/09/2021   10:49 AM  Depression screen PHQ 2/9  Decreased Interest 0 0 0 0 0  Down, Depressed, Hopeless 0 0 0 0 0  PHQ - 2 Score 0 0 0 0 0  Altered sleeping  3 0    Tired, decreased energy  1 0    Change in appetite  0 0    Feeling bad or failure about  yourself   0 0    Trouble concentrating  0 0    Moving slowly or fidgety/restless  0 0    Suicidal thoughts  0 0    PHQ-9 Score  4 0    Difficult doing work/chores  Not difficult at all Not difficult at all         Physical Exam Constitutional:      Appearance: Normal appearance.  HENT:     Head: Normocephalic and atraumatic.  Cardiovascular:     Rate and Rhythm: Normal rate and regular rhythm.     Heart sounds: Normal heart sounds.  Pulmonary:     Effort: Pulmonary effort is normal.     Breath sounds: Normal breath sounds.  Musculoskeletal:     Right ankle: No swelling.     Left ankle: No swelling.  Neurological:     General: No focal deficit present.     Mental Status: She is alert and oriented to person, place, and time.  Psychiatric:        Mood and Affect: Mood normal.        Behavior: Behavior normal.        Assessment & Plan:  [name] KitchenMarland KitchenAubriauna was seen today for medical management of chronic issues.  Diagnoses and all orders for this visit:  Anxiety and depression -     CMP14+EGFR -     busPIRone (BUSPAR) 15 MG tablet; Take 1 tablet (15 mg total) by mouth 3 (three) times daily.  Mixed hyperlipidemia -     CMP14+EGFR -     atorvastatin (LIPITOR) 40 MG tablet; Take 1 tablet (40 mg total) by mouth daily. TAKE 1 TABLET(40 MG) BY MOUTH DAILY  Prediabetes -     POCT HgB A1C -     TSH -     Semaglutide, 2 MG/DOSE, (OZEMPIC, 2 MG/DOSE,) 8 MG/3ML SOPN; INJECT 2 MG UNDER THE SKIN WEEKLY AS DIRECTED  Metabolic syndrome -     CMP14+EGFR -     Semaglutide, 2 MG/DOSE, (OZEMPIC, 2 MG/DOSE,) 8 MG/3ML SOPN; INJECT 2 MG UNDER THE SKIN WEEKLY AS DIRECTED  Chronic systolic (congestive) heart failure (HCC) -     CMP14+EGFR -     metoprolol succinate (TOPROL-XL) 25 MG 24 hr tablet; Take 1 tablet (25 mg total) by mouth daily. -     Semaglutide, 2 MG/DOSE, (OZEMPIC, 2 MG/DOSE,) 8 MG/3ML SOPN; INJECT 2 MG UNDER THE SKIN WEEKLY AS DIRECTED  Class 2 severe obesity due to excess  calories with serious comorbidity and  body mass index (BMI) of 37.0 to 37.9 in adult (HCC) -     CMP14+EGFR -     TSH -     Semaglutide, 2 MG/DOSE, (OZEMPIC, 2 MG/DOSE,) 8 MG/3ML SOPN; INJECT 2 MG UNDER THE SKIN WEEKLY AS DIRECTED  Vitamin D deficiency -     VITAMIN D 25 Hydroxy (Vit-D Deficiency, Fractures)  Aphasia -     zonisamide (ZONEGRAN) 100 MG capsule; TAKE 2 CAPSULES(200 MG) BY MOUTH AT BEDTIME -     atorvastatin (LIPITOR) 40 MG tablet; Take 1 tablet (40 mg total) by mouth daily. TAKE 1 TABLET(40 MG) BY MOUTH DAILY  Awareness alteration, transient -     zonisamide (ZONEGRAN) 100 MG capsule; TAKE 2 CAPSULES(200 MG) BY MOUTH AT BEDTIME  Word finding difficulty -     atorvastatin (LIPITOR) 40 MG tablet; Take 1 tablet (40 mg total) by mouth daily. TAKE 1 TABLET(40 MG) BY MOUTH DAILY  Atopic dermatitis, unspecified type -     triamcinolone cream (KENALOG) 0.1 %; Apply 1 Application topically 2 (two) times daily.    Following up with cardiology in December. Echo on 07/22/23 showed EF 40-45%. Doing well on metoprolol and refill given. Taking Lasix every day for swelling. BP good today.  No concerns with mood. PHQ/GAD stable.   Breathing has been good. Using albuterol inhaler with really hot or really cold weather. Using Symbicort daily and tolerating well.   A1C is 4.8 today. Patient doing well and states mood has been good. Patient getting CMP/TSH/Vit D levels today.   Lost 8 lbs in the last month on Ozempic and more active lifestyle. Doing well with no side effects. Continue on same dose.  Refilled triamcinolone for atopic dermatitis.    Return in about 6 months (around 06/25/2023).    Tandy Gaw, PA-C

## 2022-12-26 LAB — CMP14+EGFR
ALT: 18 [IU]/L (ref 0–32)
AST: 19 [IU]/L (ref 0–40)
Albumin: 4.6 g/dL (ref 3.9–4.9)
Alkaline Phosphatase: 79 [IU]/L (ref 44–121)
BUN/Creatinine Ratio: 18 (ref 12–28)
BUN: 17 mg/dL (ref 8–27)
Bilirubin Total: 0.5 mg/dL (ref 0.0–1.2)
CO2: 23 mmol/L (ref 20–29)
Calcium: 10 mg/dL (ref 8.7–10.3)
Chloride: 104 mmol/L (ref 96–106)
Creatinine, Ser: 0.97 mg/dL (ref 0.57–1.00)
Globulin, Total: 2.1 g/dL (ref 1.5–4.5)
Glucose: 93 mg/dL (ref 70–99)
Potassium: 4.4 mmol/L (ref 3.5–5.2)
Sodium: 140 mmol/L (ref 134–144)
Total Protein: 6.7 g/dL (ref 6.0–8.5)
eGFR: 66 mL/min/{1.73_m2} (ref >59)

## 2022-12-26 LAB — TSH: TSH: 0.827 u[IU]/mL (ref 0.450–4.500)

## 2022-12-26 LAB — VITAMIN D 25 HYDROXY (VIT D DEFICIENCY, FRACTURES): Vit D, 25-Hydroxy: 50.7 ng/mL (ref 30.0–100.0)

## 2022-12-28 NOTE — Progress Notes (Signed)
[place],   Vitamin D looks GREAT.  Kidney, liver, glucose look wonderful.  Thyroid normal range.   Lesly Rubenstein

## 2023-02-08 ENCOUNTER — Encounter: Payer: Self-pay | Admitting: Physician Assistant

## 2023-02-08 DIAGNOSIS — M797 Fibromyalgia: Secondary | ICD-10-CM

## 2023-02-08 MED ORDER — GABAPENTIN 300 MG PO CAPS: 300.0000 mg | ORAL_CAPSULE | Freq: Four times a day (QID) | ORAL | 1 refills | Status: DC

## 2023-02-10 ENCOUNTER — Other Ambulatory Visit: Payer: Self-pay | Admitting: Physician Assistant

## 2023-02-10 DIAGNOSIS — L209 Atopic dermatitis, unspecified: Secondary | ICD-10-CM

## 2023-02-26 ENCOUNTER — Encounter: Payer: Self-pay | Admitting: Physician Assistant

## 2023-02-26 MED ORDER — MOLNUPIRAVIR EUA 200MG CAPSULE: 4.0000 | ORAL_CAPSULE | Freq: Two times a day (BID) | ORAL | 0 refills | Status: AC

## 2023-03-04 ENCOUNTER — Other Ambulatory Visit: Payer: Self-pay | Admitting: Physician Assistant

## 2023-03-04 DIAGNOSIS — F419 Anxiety disorder, unspecified: Secondary | ICD-10-CM

## 2023-03-08 ENCOUNTER — Telehealth: Payer: Self-pay | Admitting: *Deleted

## 2023-03-08 NOTE — Telephone Encounter (Signed)
Returned call from 9:55 AM. Patient confirmed appointment information.

## 2023-03-11 ENCOUNTER — Ambulatory Visit: Payer: 59 | Admitting: Obstetrics and Gynecology

## 2023-03-11 ENCOUNTER — Encounter: Payer: Self-pay | Admitting: Obstetrics and Gynecology

## 2023-03-11 VITALS — BP 114/75 | HR 80 | Ht 62.0 in | Wt 206.0 lb

## 2023-03-11 DIAGNOSIS — N8110 Cystocele, unspecified: Secondary | ICD-10-CM

## 2023-03-11 DIAGNOSIS — Z4689 Encounter for fitting and adjustment of other specified devices: Secondary | ICD-10-CM

## 2023-03-11 LAB — POCT URINALYSIS DIPSTICK
Appearance: NEGATIVE
Bilirubin, UA: NEGATIVE
Blood, UA: NEGATIVE
Glucose, UA: NEGATIVE
Ketones, UA: NEGATIVE
Leukocytes, UA: NEGATIVE
Nitrite, UA: NEGATIVE
Protein, UA: NEGATIVE
Spec Grav, UA: 1.02 (ref 1.010–1.025)
Urobilinogen, UA: 0.2 U/dL
pH, UA: 5.5 (ref 5.0–8.0)

## 2023-03-11 NOTE — Addendum Note (Signed)
 Addended by: Kathie Dike on: 03/11/2023 01:11 PM   Modules accepted: Orders

## 2023-03-11 NOTE — Progress Notes (Signed)
   NEW GYNECOLOGY VISIT  Subjective:  Bridget Johnston is a menopausal 63 y.o. menopausal [phone] presenting for possible bladder prolapse  Known hx bladder prolapse, but has had worsening bulge & urinary symptoms for 3 months. Can see a bulge at the opening of her vagina. Reports urinary frequency, urgency, hesitancy, and mixed stress/urge incontinence worsened by lasix . Wears a panty liner to protect her underwear from urinary leakage. The patient describes a feeling of heaviness and pressure in the pelvic area, which is exacerbated by standing or walking for extended periods. The patient's symptoms have been causing significant discomfort and impacting her quality of life, including her sexual activity.  Taking lasix  for CHF  Objective:   Vitals:   03/11/23 0915  BP: 114/75  Pulse: 80  Weight: 206 lb (93.4 kg)  Height: 5' 2 (1.575 m)   General:  Alert, oriented and cooperative. Patient is in no acute distress.  Skin: Skin is warm and dry. No rash noted.   Cardiovascular: Normal heart rate noted  Respiratory: Normal respiratory effort, no problems with respiration noted  Abdomen: Soft, non-tender, non-distended   Pelvic: Mild erythema over vulva concerning for irritant/contact dermatitis.    Exam performed in the presence of a chaperone   Assessment and Plan:  Bridget Johnston is a 63 y.o. with uterovaginal prolapse  Female bladder prolapse Fitting and adjustment of pessary Discussed risks/benefits of options for management including PFPT, pessary, and surgery. Offered urogyn referral but she declines for now Fitted with #3 ring with support & knob Patient able to independently insert & remove pessary. Pessary provided today. Discussed pessary break weekly if she decides to use more than prn for long days of standing/walking -     Ambulatory referral to Physical Therapy  Return in about 4 weeks (around 04/08/2023) for pessary follow up.  I spent a total of 50 minutes reviewing  the patient's chart, interviewing/counseling the patient, examining the patient, performing pessary fitting, and documenting our encounter.   Future Appointments  Date Time Provider Department Center  04/07/2023  8:50 AM Erik Kieth BROCKS, MD [facility]  06/25/2023  9:30 AM Antoniette Vermell CROME, PA-C [facility] None   Kieth BROCKS Erik, MD

## 2023-03-14 LAB — URINE CULTURE

## 2023-03-15 ENCOUNTER — Encounter: Payer: Self-pay | Admitting: Obstetrics and Gynecology

## 2023-03-15 ENCOUNTER — Other Ambulatory Visit: Payer: Self-pay | Admitting: *Deleted

## 2023-03-15 MED ORDER — CIPROFLOXACIN HCL 250 MG PO TABS: 250.0000 mg | ORAL_TABLET | Freq: Two times a day (BID) | ORAL | 0 refills | Status: AC

## 2023-03-15 MED ORDER — FLUCONAZOLE 150 MG PO TABS: 150.0000 mg | ORAL_TABLET | Freq: Once | ORAL | 0 refills | Status: AC

## 2023-03-15 NOTE — Progress Notes (Signed)
 Pt sent message in that she gets yeast after taking antibiotics.  She is requesting something for yeast since she has been sent in antibiotics.  Diflucan sent to her pharmacy.

## 2023-03-21 ENCOUNTER — Encounter: Payer: Self-pay | Admitting: Physician Assistant

## 2023-03-22 ENCOUNTER — Other Ambulatory Visit: Payer: Self-pay | Admitting: Physician Assistant

## 2023-03-22 DIAGNOSIS — E782 Mixed hyperlipidemia: Secondary | ICD-10-CM

## 2023-03-22 DIAGNOSIS — I10 Essential (primary) hypertension: Secondary | ICD-10-CM

## 2023-03-22 DIAGNOSIS — E8881 Metabolic syndrome: Secondary | ICD-10-CM | POA: Insufficient documentation

## 2023-03-22 DIAGNOSIS — I5022 Chronic systolic (congestive) heart failure: Secondary | ICD-10-CM

## 2023-03-22 DIAGNOSIS — R7303 Prediabetes: Secondary | ICD-10-CM

## 2023-03-22 DIAGNOSIS — E66812 Obesity, class 2: Secondary | ICD-10-CM | POA: Insufficient documentation

## 2023-03-22 MED ORDER — WEGOVY 2.4 MG/0.75ML ~~LOC~~ SOAJ: 2.4000 mg | SUBCUTANEOUS | 3 refills | Status: DC

## 2023-03-22 NOTE — Progress Notes (Signed)
 Insurance denied ozempic due to not being a diabetic.  Will try for wegovy based on BMI and commodities.

## 2023-03-23 ENCOUNTER — Telehealth: Payer: Self-pay

## 2023-03-23 ENCOUNTER — Encounter: Payer: Self-pay | Admitting: Physician Assistant

## 2023-03-23 NOTE — Telephone Encounter (Signed)
 WEGOVY INJ 2.4MG  is approved through 07/21/2023.

## 2023-03-23 NOTE — Telephone Encounter (Signed)
 PA submitted for [facility]

## 2023-03-25 ENCOUNTER — Ambulatory Visit: Payer: 59

## 2023-03-25 DIAGNOSIS — R300 Dysuria: Secondary | ICD-10-CM

## 2023-03-25 NOTE — Progress Notes (Signed)
Repeat urine culture per Dr.Forsyth

## 2023-03-27 ENCOUNTER — Encounter: Payer: Self-pay | Admitting: Physician Assistant

## 2023-03-27 ENCOUNTER — Encounter: Payer: Self-pay | Admitting: Obstetrics and Gynecology

## 2023-03-27 LAB — URINE CULTURE

## 2023-03-29 ENCOUNTER — Encounter: Payer: Self-pay | Admitting: Obstetrics and Gynecology

## 2023-03-29 MED ORDER — VITAMIN D (ERGOCALCIFEROL) 1.25 MG (50000 UNIT) PO CAPS: 50000.0000 [IU] | ORAL_CAPSULE | ORAL | 1 refills | Status: DC

## 2023-03-30 ENCOUNTER — Other Ambulatory Visit: Payer: Self-pay | Admitting: Physician Assistant

## 2023-03-30 DIAGNOSIS — L209 Atopic dermatitis, unspecified: Secondary | ICD-10-CM

## 2023-04-07 ENCOUNTER — Ambulatory Visit: Payer: 59 | Admitting: Obstetrics and Gynecology

## 2023-04-16 ENCOUNTER — Encounter: Payer: Self-pay | Admitting: Obstetrics and Gynecology

## 2023-04-16 DIAGNOSIS — N8110 Cystocele, unspecified: Secondary | ICD-10-CM

## 2023-05-03 ENCOUNTER — Other Ambulatory Visit: Payer: Self-pay | Admitting: Physician Assistant

## 2023-05-25 ENCOUNTER — Other Ambulatory Visit: Payer: Self-pay | Admitting: Physician Assistant

## 2023-05-25 DIAGNOSIS — L209 Atopic dermatitis, unspecified: Secondary | ICD-10-CM

## 2023-05-26 ENCOUNTER — Ambulatory Visit: Payer: Self-pay

## 2023-06-09 ENCOUNTER — Encounter: Payer: Self-pay | Admitting: Physician Assistant

## 2023-06-11 ENCOUNTER — Telehealth: Admitting: Physician Assistant

## 2023-06-11 VITALS — BP 108/70 | HR 72 | Temp 98.2°F | Ht 62.0 in | Wt 196.0 lb

## 2023-06-11 DIAGNOSIS — J4521 Mild intermittent asthma with (acute) exacerbation: Secondary | ICD-10-CM | POA: Diagnosis not present

## 2023-06-11 DIAGNOSIS — J4520 Mild intermittent asthma, uncomplicated: Secondary | ICD-10-CM

## 2023-06-11 MED ORDER — ALBUTEROL SULFATE HFA 108 (90 BASE) MCG/ACT IN AERS: 2.0000 | INHALATION_SPRAY | RESPIRATORY_TRACT | 1 refills | Status: DC | PRN

## 2023-06-11 MED ORDER — PROMETHAZINE-DM 6.25-15 MG/5ML PO SYRP: 5.0000 mL | ORAL_SOLUTION | Freq: Four times a day (QID) | ORAL | 0 refills | Status: DC | PRN

## 2023-06-11 MED ORDER — METHYLPREDNISOLONE 4 MG PO TBPK: ORAL_TABLET | ORAL | 0 refills | Status: DC

## 2023-06-11 MED ORDER — AZITHROMYCIN 250 MG PO TABS: ORAL_TABLET | ORAL | 0 refills | Status: DC

## 2023-06-11 NOTE — Progress Notes (Signed)
..  Virtual Visit via Video Note  I connected with [name] on 06/11/23 at 11:30 AM EDT by a video enabled telemedicine application and verified that I am speaking with the correct person using two identifiers.  Location: Patient: home Provider: clinic  .[name] KitchenParticipating in visit:  Patient: Bridget Johnston Provider: Tandy Gaw PA-C   I discussed the limitations of evaluation and management by telemedicine and the availability of in person appointments. The patient expressed understanding and agreed to proceed.  History of Present Illness: Pt is a 63 yo female with asthma who presents to the clinic with 6 days of coughing, SOB, chest tightness. She is taking mucinex, flonase, delsym and using her albuterol rescue inhaler. Her cough is productive but feels like still "stuck in chest".     Observations/Objective: No acute distress Productive cough  .[name] Kitchen Today's Vitals   06/11/23 1106  BP: 108/70  Pulse: 72  Temp: 98.2 F (36.8 C)  Weight: 196 lb (88.9 kg)  Height: 5\' 2"  (1.575 m)   Body mass index is 35.85 kg/m.   Assessment and Plan: [name] KitchenMarland KitchenPorshea was seen today for cough.  Diagnoses and all orders for this visit:  Mild intermittent asthmatic bronchitis with acute exacerbation -     albuterol (VENTOLIN HFA) 108 (90 Base) MCG/ACT inhaler; Inhale 2 puffs into the lungs every 4 (four) hours as needed for wheezing or shortness of breath. -     methylPREDNISolone (MEDROL DOSEPAK) 4 MG TBPK tablet; Take as directed by package insert. -     azithromycin (ZITHROMAX Z-PAK) 250 MG tablet; Take 2 tablets (500 mg) on  Day 1,  followed by 1 tablet (250 mg) once daily on Days 2 through 5. -     promethazine-dextromethorphan (PROMETHAZINE-DM) 6.25-15 MG/5ML syrup; Take 5 mLs by mouth 4 (four) times daily as needed.  Mild intermittent asthma without complication -     albuterol (VENTOLIN HFA) 108 (90 Base) MCG/ACT inhaler; Inhale 2 puffs into the lungs every 4 (four) hours as needed for wheezing or  shortness of breath.   Sent zpak and medrol dose pack Refilled rescue inhaler Cough syrup as needed Rest and hydrate Follow up as needed of if symptoms persist    Follow Up Instructions:    I discussed the assessment and treatment plan with the patient. The patient was provided an opportunity to ask questions and all were answered. The patient agreed with the plan and demonstrated an understanding of the instructions.   The patient was advised to call back or seek an in-person evaluation if the symptoms worsen or if the condition fails to improve as anticipated.   Tandy Gaw, PA-C

## 2023-06-20 ENCOUNTER — Other Ambulatory Visit: Payer: Self-pay | Admitting: Physician Assistant

## 2023-06-25 ENCOUNTER — Ambulatory Visit: Payer: 59 | Admitting: Physician Assistant

## 2023-06-30 ENCOUNTER — Other Ambulatory Visit: Payer: Self-pay | Admitting: Physician Assistant

## 2023-06-30 DIAGNOSIS — R600 Localized edema: Secondary | ICD-10-CM

## 2023-07-02 ENCOUNTER — Ambulatory Visit (INDEPENDENT_AMBULATORY_CARE_PROVIDER_SITE_OTHER): Admitting: Physician Assistant

## 2023-07-02 ENCOUNTER — Encounter: Payer: Self-pay | Admitting: Physician Assistant

## 2023-07-02 VITALS — BP 107/65 | HR 81 | Ht 62.0 in | Wt 194.0 lb

## 2023-07-02 DIAGNOSIS — Z6835 Body mass index (BMI) 35.0-35.9, adult: Secondary | ICD-10-CM

## 2023-07-02 DIAGNOSIS — E66812 Obesity, class 2: Secondary | ICD-10-CM | POA: Diagnosis not present

## 2023-07-02 DIAGNOSIS — G5622 Lesion of ulnar nerve, left upper limb: Secondary | ICD-10-CM

## 2023-07-02 DIAGNOSIS — N814 Uterovaginal prolapse, unspecified: Secondary | ICD-10-CM

## 2023-07-02 DIAGNOSIS — J4530 Mild persistent asthma, uncomplicated: Secondary | ICD-10-CM

## 2023-07-02 DIAGNOSIS — R404 Transient alteration of awareness: Secondary | ICD-10-CM | POA: Diagnosis not present

## 2023-07-02 DIAGNOSIS — L578 Other skin changes due to chronic exposure to nonionizing radiation: Secondary | ICD-10-CM

## 2023-07-02 DIAGNOSIS — F419 Anxiety disorder, unspecified: Secondary | ICD-10-CM | POA: Diagnosis not present

## 2023-07-02 DIAGNOSIS — R4701 Aphasia: Secondary | ICD-10-CM | POA: Diagnosis not present

## 2023-07-02 DIAGNOSIS — F32A Depression, unspecified: Secondary | ICD-10-CM

## 2023-07-02 DIAGNOSIS — M797 Fibromyalgia: Secondary | ICD-10-CM

## 2023-07-02 MED ORDER — GABAPENTIN 300 MG PO CAPS
300.0000 mg | ORAL_CAPSULE | Freq: Four times a day (QID) | ORAL | 1 refills | Status: DC
Start: 2023-07-02 — End: 2023-12-29

## 2023-07-02 MED ORDER — BUSPIRONE HCL 15 MG PO TABS: ORAL_TABLET | ORAL | 1 refills | Status: DC

## 2023-07-02 MED ORDER — BUDESONIDE-FORMOTEROL FUMARATE 160-4.5 MCG/ACT IN AERO: 2.0000 | INHALATION_SPRAY | Freq: Two times a day (BID) | RESPIRATORY_TRACT | 1 refills | Status: DC

## 2023-07-02 MED ORDER — TRETINOIN 0.1 % EX CREA: TOPICAL_CREAM | Freq: Every day | CUTANEOUS | 1 refills | Status: DC

## 2023-07-02 MED ORDER — VILAZODONE HCL 40 MG PO TABS: 40.0000 mg | ORAL_TABLET | Freq: Every day | ORAL | 3 refills | Status: AC

## 2023-07-02 MED ORDER — ZONISAMIDE 100 MG PO CAPS: ORAL_CAPSULE | ORAL | 1 refills | Status: DC

## 2023-07-05 ENCOUNTER — Encounter: Payer: Self-pay | Admitting: Physician Assistant

## 2023-07-05 DIAGNOSIS — N814 Uterovaginal prolapse, unspecified: Secondary | ICD-10-CM | POA: Insufficient documentation

## 2023-07-05 DIAGNOSIS — G5622 Lesion of ulnar nerve, left upper limb: Secondary | ICD-10-CM | POA: Insufficient documentation

## 2023-07-05 NOTE — Progress Notes (Signed)
 Established Patient Office Visit  Subjective   Patient ID: Bridget Johnston, female    DOB: 1961/01/05  Age: 63 y.o. MRN: [id number]  Chief Complaint  Patient presents with   Medical Management of Chronic Issues    Mood/ wgt management  rx refills    HPI Pt is a 63 yo obese female with MDD, GAD, Aphasia, HLD, Asthma, fibromyalgia who presents to the clinic for medication refills.   She is doing well with mood not concerns. Overall she is doing well on all medications. She is active but does not exercise. She is on wegovy  for weight loss. She has lost 12lbs since January. She is doing well.   No problems with breathing. Doing well on symbicort .   She has had new dx of uterine prolapse and left ulnar nerve entrapment. She has surgery consult for uterine prolapse and ulnar nerve pain has improved and back to work.   Request retin a for sun damaged skin areas.    ROS See HPI.    Objective:     BP 107/65   Pulse 81   Ht 5\' 2"  (1.575 m)   Wt 194 lb (88 kg)   SpO2 99%   BMI 35.48 kg/m  BP Readings from Last 3 Encounters:  07/02/23 107/65  06/11/23 108/70  03/11/23 114/75   Wt Readings from Last 3 Encounters:  07/02/23 194 lb (88 kg)  06/11/23 196 lb (88.9 kg)  03/11/23 206 lb (93.4 kg)    ..    07/02/2023   11:18 AM 12/25/2022    9:38 AM 06/19/2022    8:50 AM 03/05/2022    8:50 AM 11/14/2021   10:06 AM  Depression screen PHQ 2/9  Decreased Interest 0 0 0 0 0  Down, Depressed, Hopeless 0 0 0 0 0  PHQ - 2 Score 0 0 0 0 0  Altered sleeping 0  3 0   Tired, decreased energy 0  1 0   Change in appetite 0  0 0   Feeling bad or failure about yourself  0  0 0   Trouble concentrating 0  0 0   Moving slowly or fidgety/restless 0  0 0   Suicidal thoughts 0  0 0   PHQ-9 Score 0  4 0   Difficult doing work/chores Not difficult at all  Not difficult at all Not difficult at all    .Aaron Aas    07/02/2023   11:18 AM 06/19/2022    9:00 AM 03/05/2022    8:51 AM 11/01/2020   10:42  AM  GAD 7 : Generalized Anxiety Score  Nervous, Anxious, on Edge 0 2 3 0  Control/stop worrying 0 3 3 0  Worry too much - different things 0 3 3 0  Trouble relaxing 0 0 3 0  Restless 0 1 2 0  Easily annoyed or irritable 0 1 2 0  Afraid - awful might happen 0 2 3 0  Total GAD 7 Score 0 12 19 0  Anxiety Difficulty Not difficult at all Somewhat difficult Not difficult at all       Physical Exam Constitutional:      Appearance: Normal appearance. She is obese.  HENT:     Head: Normocephalic.  Cardiovascular:     Rate and Rhythm: Normal rate and regular rhythm.  Pulmonary:     Effort: Pulmonary effort is normal.     Breath sounds: Normal breath sounds.  Neurological:     General: No focal  deficit present.     Mental Status: She is alert and oriented to person, place, and time.  Psychiatric:        Mood and Affect: Mood normal.        Assessment & Plan:  Aaron AasAaron AasSharvon was seen today for medical management of chronic issues.  Diagnoses and all orders for this visit:  Class 2 severe obesity due to excess calories with serious comorbidity and body mass index (BMI) of 35.0 to 35.9 in adult (HCC)  Anxiety and depression -     busPIRone  (BUSPAR ) 15 MG tablet; TAKE 1 TABLET(15 MG) BY MOUTH THREE TIMES DAILY -     Vilazodone  HCl (VIIBRYD ) 40 MG TABS; Take 1 tablet (40 mg total) by mouth daily.  Aphasia -     zonisamide  (ZONEGRAN ) 100 MG capsule; TAKE 2 CAPSULES(200 MG) BY MOUTH AT BEDTIME  Awareness alteration, transient -     zonisamide  (ZONEGRAN ) 100 MG capsule; TAKE 2 CAPSULES(200 MG) BY MOUTH AT BEDTIME  Fibromyalgia -     gabapentin  (NEURONTIN ) 300 MG capsule; Take 1 capsule (300 mg total) by mouth 4 (four) times daily.  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  Mild persistent asthma without complication -     budesonide -formoterol  (SYMBICORT ) 160-4.5 MCG/ACT inhaler;  Inhale 2 puffs into the lungs 2 (two) times daily.   PHQ and GAD numbers to goal Refills sent Labs UTD Vitals look GREAT Continues to lose weight down another 12lbs in 4 months, continue wegovy .  Continue with GYN for uterine prolapse  Continue with ortho for ulnar nerve entrapement Follow up as needed or in 6 months    Return in about 6 months (around 01/01/2024).    Gumecindo Hopkin, PA-C

## 2023-07-07 NOTE — Telephone Encounter (Signed)
 This request has been handled. No further action is required. Please review other telephone encounters for additional information.

## 2023-07-16 ENCOUNTER — Other Ambulatory Visit: Payer: Self-pay | Admitting: Physician Assistant

## 2023-07-16 DIAGNOSIS — L209 Atopic dermatitis, unspecified: Secondary | ICD-10-CM

## 2023-07-23 ENCOUNTER — Other Ambulatory Visit: Payer: Self-pay

## 2023-07-26 ENCOUNTER — Ambulatory Visit: Admitting: Physical Therapy

## 2023-07-30 ENCOUNTER — Ambulatory Visit (INDEPENDENT_AMBULATORY_CARE_PROVIDER_SITE_OTHER): Payer: 59 | Admitting: Obstetrics and Gynecology

## 2023-07-30 ENCOUNTER — Encounter: Payer: Self-pay | Admitting: Obstetrics and Gynecology

## 2023-07-30 VITALS — BP 103/68 | HR 83 | Ht 61.42 in | Wt 196.4 lb

## 2023-07-30 DIAGNOSIS — R32 Unspecified urinary incontinence: Secondary | ICD-10-CM

## 2023-07-30 DIAGNOSIS — N3281 Overactive bladder: Secondary | ICD-10-CM | POA: Diagnosis not present

## 2023-07-30 DIAGNOSIS — N952 Postmenopausal atrophic vaginitis: Secondary | ICD-10-CM | POA: Diagnosis not present

## 2023-07-30 DIAGNOSIS — N8110 Cystocele, unspecified: Secondary | ICD-10-CM

## 2023-07-30 DIAGNOSIS — N814 Uterovaginal prolapse, unspecified: Secondary | ICD-10-CM

## 2023-07-30 DIAGNOSIS — N813 Complete uterovaginal prolapse: Secondary | ICD-10-CM | POA: Diagnosis not present

## 2023-07-30 LAB — POCT URINALYSIS DIP (CLINITEK)
Bilirubin, UA: NEGATIVE
Blood, UA: NEGATIVE
Glucose, UA: NEGATIVE mg/dL
Leukocytes, UA: NEGATIVE
Nitrite, UA: NEGATIVE
POC PROTEIN,UA: 100 — AB
Spec Grav, UA: 1.025 (ref 1.010–1.025)
Urobilinogen, UA: 0.2 U/dL
pH, UA: 5.5 (ref 5.0–8.0)

## 2023-07-30 MED ORDER — ESTRADIOL 0.1 MG/GM VA CREA: 0.5000 g | TOPICAL_CREAM | VAGINAL | 11 refills | Status: AC

## 2023-07-30 MED ORDER — MIRABEGRON ER 25 MG PO TB24: 25.0000 mg | ORAL_TABLET | Freq: Every day | ORAL | 5 refills | Status: DC

## 2023-07-30 NOTE — Patient Instructions (Addendum)
 Consider your surgical options with the chart.   Start Myrbetriq 25mg  daily for overactive bladder symptoms -Try and track how often you are still having the leakages  -We will plan for urodynamics then a follow up with Dr. Frutoso Jing for surgical planning.

## 2023-07-30 NOTE — Progress Notes (Signed)
 [facility] New Patient Evaluation and Consultation  Referring Provider: Izell Marsh, MD PCP: Kita Perish Date of Service: 07/30/2023  SUBJECTIVE Chief Complaint: New Patient (Initial Visit) (Bridget Johnston is a 63 y.o. female is here for Uterovaginal prolapse, desires surgical management)  History of Present Illness: Bridget Johnston is a 63 y.o. White or Caucasian female seen in consultation at the request of Dr. Donetta Furl for evaluation of prolapse.    Review of records significant for:  Note from Dr. Donetta Furl 03/11/23  Known hx bladder prolapse, but has had worsening bulge & urinary symptoms for 3 months. Can see a bulge at the opening of her vagina. Reports urinary frequency, urgency, hesitancy, and mixed stress/urge incontinence worsened by lasix . Wears a panty liner to protect her underwear from urinary leakage. The patient describes a feeling of heaviness and pressure in the pelvic area, which is exacerbated by standing or walking for extended periods. The patient's symptoms have been causing significant discomfort and impacting her quality of life, including her sexual activity.     Culture Results: 03/25/23: +Mixed Urogenital Flora 03/11/23: Klebsiella pneumoniae >100,000 Multiresistant  Has attempted multiple pessaries with Dr. Donetta Furl ([employer])   Had abdominal surgery for tubal ligation   Urinary Symptoms: Leaks urine with with a full bladder, with movement to the bathroom, and with urgency Leaks 2-3 time(s) per days.  Pad use: 2 liners/ mini-pads per day.   Patient is bothered by UI symptoms.  Day time voids 8-10.  Nocturia: 0-1 times per night to void. Voiding dysfunction:  empties bladder well.  Patient does not use a catheter to empty bladder.  When urinating, patient feels difficulty starting urine stream, dribbling after finishing, and the need to urinate multiple times in a row Drinks: 60oz water, and 1 glass sweet tea with  dinner per day  UTIs: 4 UTI's in the last year.   Denies history of blood in urine, kidney or bladder stones, pyelonephritis, bladder cancer, and kidney cancer No results found for the last 90 days.   Pelvic Organ Prolapse Symptoms:                  Patient Admits to a feeling of a bulge the vaginal area. It has been present for 1 years.  Patient Admits to seeing a bulge.  This bulge is bothersome.  Bowel Symptom: Bowel movements: 1 time(s) per day Stool consistency: loose Straining: yes.  Splinting: no.  Incomplete evacuation: yes.  Patient Admits to accidental bowel leakage / fecal incontinence  Occurs: 2 time(s) per week  Consistency with leakage: soft  or liquid Bowel regimen: none Last colonoscopy: Date 2019, Results WNL HM Colonoscopy          Upcoming     Colonoscopy (Every 10 Years) Next due on 04/06/2026    04/06/2016  HM Colonoscopy component of [facility] COLONOSCOPY   Only the first 1 history entries have been loaded, but more history exists.                Sexual Function Sexually active: yes.  Sexual orientation: Straight Pain with sex: Yes, at the vaginal opening, deep in the pelvis, has discomfort due to prolapse, has discomfort due to dryness  Pelvic Pain Denies pelvic pain    Past Medical History:  Past Medical History:  Diagnosis Date   Arthritis    Asthma    Back pain    Diabetes mellitus without complication (HCC)    Finger pain  Hand pain    High triglycerides    Leg pain      Past Surgical History:   Past Surgical History:  Procedure Laterality Date   ABLATION  2007   BTL  1988   NOVASURE ABLATION     TONSILLECTOMY     TUBAL LIGATION       Past OB/GYN History: [id number] Vaginal deliveries: 2,  Forceps/ Vacuum deliveries: 0, Cesarean section: 0 Menopausal: Yes Contraception: None. Last pap smear was 2024.  Any history of abnormal pap smears: no. HM PAP   This patient has no relevant Health Maintenance data.      Medications: Patient has a current medication list which includes the following prescription(s): albuterol , AMBULATORY NON FORMULARY MEDICATION, atorvastatin , one touch ultra mini, budesonide -formoterol , buspirone , dicyclomine , [START ON 08/02/2023] estradiol, furosemide , gabapentin , metoprolol  succinate, mirabegron er, omega-3 acid ethyl esters, onetouch verio, tretinoin , triamcinolone  cream, vilazodone  hcl, vitamin d  (ergocalciferol ), wegovy , and zonisamide .   Allergies: Patient is allergic to prednisolone, measles and rubella virus vaccine, other, jardiance [empagliflozin], adhesive [tape], and prednisone .   Social History:  Social History   Tobacco Use   Smoking status: Former    Current packs/day: 0.00    Average packs/day: 1 pack/day for 17.0 years (17.0 ttl pk-yrs)    Types: Cigarettes, E-cigarettes    Start date: 01/06/1995    Quit date: 01/06/2012    Years since quitting: 11.5   Smokeless tobacco: Never  Vaping Use   Vaping status: Never Used  Substance Use Topics   Alcohol use: No    Alcohol/week: 0.0 standard drinks of alcohol   Drug use: No    Comment: opiod adduction    Relationship status: married Patient lives with husband.   Patient is not employed. Regular exercise: No History of abuse: No  Family History:   Family History  Problem Relation Age of Onset   Heart disease Mother    Hypertension Mother    Heart disease Father    Diabetes Father    Hypertension Father    Arthritis Other    Diabetes Other    Cancer - Colon Maternal Grandfather      Review of Systems: Review of Systems  Constitutional:  Negative for chills and fever.  Respiratory:  Negative for cough and shortness of breath.   Cardiovascular:  Negative for chest pain and palpitations.  Gastrointestinal:  Negative for abdominal pain, blood in stool, constipation and diarrhea.  Skin:  Negative for rash.  Neurological:  Negative for weakness.  Endo/Heme/Allergies:  Bruises/bleeds  easily.  Psychiatric/Behavioral:  Negative for depression and suicidal ideas.      OBJECTIVE Physical Exam: Vitals:   07/30/23 0929  BP: 103/68  Pulse: 83  Weight: 196 lb 6.4 oz (89.1 kg)  Height: 5' 1.42" (1.56 m)    Physical Exam Vitals reviewed. Exam conducted with a chaperone present.  Constitutional:      Appearance: Normal appearance.  Pulmonary:     Effort: Pulmonary effort is normal.  Abdominal:     Palpations: Abdomen is soft.  Neurological:     General: No focal deficit present.     Mental Status: She is alert and oriented to person, place, and time.  Psychiatric:        Mood and Affect: Mood normal.        Behavior: Behavior normal. Behavior is cooperative.        Thought Content: Thought content normal.      GU / Detailed Urogynecologic Evaluation:  Pelvic Exam: Normal  external female genitalia; Bartholin's and Skene's glands normal in appearance; urethral meatus normal in appearance, no urethral masses or discharge.   CST: negative  Speculum exam reveals normal vaginal mucosa with atrophy. Cervix normal appearance. Uterus normal single, nontender. Adnexa normal adnexa.    With apex supported, anterior compartment defect was present  Pelvic floor strength II/V  Pelvic floor musculature: Right levator non-tender, Right obturator non-tender, Left levator non-tender, Left obturator tender  POP-Q:   POP-Q  0                                            Aa   -0.5                                           Ba  -4                                              C   4                                            Gh  4                                            Pb  9                                            tvl   -2.5                                            Ap  -2.5                                            Bp  -6                                              D      Rectal Exam:  Normal external exam  Post-Void Residual (PVR) by Bladder  Scan: In order to evaluate bladder emptying, we discussed obtaining a postvoid residual and patient agreed to this procedure.  Procedure: The ultrasound unit was placed on the patient's abdomen in the suprapubic region after the patient had voided.    Post Void Residual - 07/30/23 0943       Post Void Residual   Post Void Residual 18 mL              Laboratory Results: Lab Results  Component Value Date   COLORU yellow 07/30/2023   CLARITYU clear 07/30/2023   GLUCOSEUR negative 07/30/2023   BILIRUBINUR negative 07/30/2023   KETONESU neg 03/11/2023   SPECGRAV 1.025 07/30/2023   RBCUR negative 07/30/2023   PHUR 5.5 07/30/2023   PROTEINUR Negative 03/11/2023   UROBILINOGEN 0.2 07/30/2023   LEUKOCYTESUR Negative 07/30/2023    Lab Results  Component Value Date   CREATININE 0.97 12/25/2022   CREATININE 0.84 11/21/2021   CREATININE 1.11 (H) 11/14/2021    Lab Results  Component Value Date   HGBA1C 4.8 12/25/2022    Lab Results  Component Value Date   HGB 12.9 03/21/2021     ASSESSMENT AND PLAN Ms. Nichter is a 63 y.o. with:  1. Prolapse of anterior vaginal wall   2. Uterine prolapse   3. OAB (overactive bladder)   4. Incontinence in female   5. Vaginal atrophy    Based on my exam today and previous exam by Dr. Donetta Furl, patient has stage III anterior vaginal wall prolapse.  Patient is interested in surgery.  Using the [facility] handouts we discussed her pelvic floor defects and discussed surgical options.  Options for surgery included a sacrocolpopexy and sacrospinous ligament fixation either with or without hysterectomy.  [facility] handouts on both surgical options given to patient.  Patient is considering robotic procedure.  She has had previous abdominal surgery with tubal ligation.  As patient has stage III prolapse we will have her undergo urodynamics testing to rule out occult stress urinary incontinence.  Patient will been meet with surgeon to discuss final surgical  planning. Patient has symptoms of overactive bladder that I think are separate from her prolapse issues.  She has nocturia and frequently has frequency of micturition with little output.  Will start patient on a beta 3 agonist Myrbetriq 25 mg daily.  As she is currently on Wegovy , would prefer not to give any anticholinergic medications that could exacerbate constipation. Will start patient on Myrbetriq 25 mg daily for nocturia and incontinence.  Discussed with patient side effects of elevated blood pressure.  We will see her in about a month to do urodynamics testing and can check her blood pressure at that point.  We will also consider increasing her Myrbetriq to 50 mg if she is seeing some improvement. Patient has vaginal atrophy on exam. She would benefit from estrogen cream. Patient to use a blueberry sized amount into the vagina. She may use this nightly for 2 weeks and then twice weekly after. We discussed using her finger instead of using the applicator.    Patient to return for urodynamics testing

## 2023-08-02 ENCOUNTER — Encounter: Payer: Self-pay | Admitting: Obstetrics and Gynecology

## 2023-08-06 ENCOUNTER — Telehealth: Payer: Self-pay

## 2023-08-06 NOTE — Telephone Encounter (Signed)
 Prior authorization has been submitted to [facility]. Will await return communication.

## 2023-08-10 ENCOUNTER — Encounter: Payer: Self-pay | Admitting: Physician Assistant

## 2023-08-12 ENCOUNTER — Other Ambulatory Visit (HOSPITAL_COMMUNITY): Payer: Self-pay

## 2023-08-12 ENCOUNTER — Telehealth: Payer: Self-pay

## 2023-08-12 NOTE — Telephone Encounter (Signed)
 Pharmacy Patient Advocate Encounter  Received notification from [employer] that Prior Authorization for Wegovy  2.4MG /0.75ML auto-injectors  has been APPROVED from 08/12/23 to 08/11/24. Ran test claim, Copay is $24.99. This test claim was processed through [facility]- copay amounts may vary at other pharmacies due to pharmacy/plan contracts, or as the patient moves through the different stages of their insurance plan.   PA #/Case ID/Reference #:  [id number]

## 2023-08-12 NOTE — Telephone Encounter (Signed)
 Pharmacy Patient Advocate Encounter   Received notification from Patient Advice Request messages that prior authorization for Wegovy  2.4MG /0.75ML auto-injectors is required/requested.   Insurance verification completed.   The patient is insured through [facility] .   Per test claim: PA required; PA submitted to above mentioned insurance via CoverMyMeds Key/confirmation #/EOC [id number] Status is pending

## 2023-08-19 NOTE — Telephone Encounter (Signed)
 PA has been denied. We will have to submit an appeal.

## 2023-08-24 ENCOUNTER — Encounter: Admitting: Obstetrics and Gynecology

## 2023-08-27 NOTE — Telephone Encounter (Signed)
 Approved.

## 2023-08-28 ENCOUNTER — Encounter: Payer: Self-pay | Admitting: Physician Assistant

## 2023-08-28 DIAGNOSIS — F419 Anxiety disorder, unspecified: Secondary | ICD-10-CM

## 2023-08-28 DIAGNOSIS — R4701 Aphasia: Secondary | ICD-10-CM

## 2023-08-28 DIAGNOSIS — R404 Transient alteration of awareness: Secondary | ICD-10-CM

## 2023-08-30 MED ORDER — ZONISAMIDE 100 MG PO CAPS
ORAL_CAPSULE | ORAL | 1 refills | Status: DC
Start: 2023-08-30 — End: 2023-12-29

## 2023-08-30 MED ORDER — BUSPIRONE HCL 15 MG PO TABS: ORAL_TABLET | ORAL | 1 refills | Status: DC

## 2023-09-01 ENCOUNTER — Other Ambulatory Visit (HOSPITAL_COMMUNITY)
Admission: RE | Admit: 2023-09-01 | Discharge: 2023-09-01 | Disposition: A | Discharge status: Home / Self Care | Source: Ambulatory Visit | Attending: Obstetrics and Gynecology | Admitting: Obstetrics and Gynecology

## 2023-09-01 ENCOUNTER — Ambulatory Visit (INDEPENDENT_AMBULATORY_CARE_PROVIDER_SITE_OTHER): Admitting: Obstetrics and Gynecology

## 2023-09-01 VITALS — BP 92/61 | HR 78

## 2023-09-01 DIAGNOSIS — R350 Frequency of micturition: Secondary | ICD-10-CM

## 2023-09-01 DIAGNOSIS — N3281 Overactive bladder: Secondary | ICD-10-CM

## 2023-09-01 DIAGNOSIS — N8110 Cystocele, unspecified: Secondary | ICD-10-CM

## 2023-09-01 DIAGNOSIS — R82998 Other abnormal findings in urine: Secondary | ICD-10-CM | POA: Insufficient documentation

## 2023-09-01 DIAGNOSIS — N814 Uterovaginal prolapse, unspecified: Secondary | ICD-10-CM

## 2023-09-01 LAB — POCT URINALYSIS DIP (CLINITEK)
Bilirubin, UA: NEGATIVE
Blood, UA: NEGATIVE
Glucose, UA: NEGATIVE mg/dL
Nitrite, UA: NEGATIVE
POC PROTEIN,UA: 100 — AB
Spec Grav, UA: 1.02 (ref 1.010–1.025)
Urobilinogen, UA: 0.2 U/dL
pH, UA: 6 (ref 5.0–8.0)

## 2023-09-01 MED ORDER — SULFAMETHOXAZOLE-TRIMETHOPRIM 800-160 MG PO TABS: 1.0000 | ORAL_TABLET | Freq: Two times a day (BID) | ORAL | 0 refills | Status: AC

## 2023-09-01 MED ORDER — PHENAZOPYRIDINE HCL 200 MG PO TABS: 200.0000 mg | ORAL_TABLET | Freq: Three times a day (TID) | ORAL | 0 refills | Status: DC | PRN

## 2023-09-01 NOTE — Patient Instructions (Signed)
Taking Care of Yourself after Urodynamics   Drink plenty of water for a day or two following your procedure. Try to have about 8 ounces (one cup) at a time, and do this 6 times or more per day unless you have fluid restrictitons AVOID irritative beverages such as coffee, tea, soda, alcoholic or citrus drinks for a day or two, as this may cause burning with urination. You may experience some discomfort or a burning sensation with urination after having this procedure. You can use over the counter Azo or pyridium to help with burning and follow the instructions on the packaging. If it does not improve within 1-2 days, or other symptoms appear (fever, chills, or difficulty urinating) call the office to speak to a nurse.  You may return to normal daily activities such as work, school, driving, exercising and housework on the day of the procedure. If your doctor gave you a prescription, take it as ordered.

## 2023-09-01 NOTE — Progress Notes (Signed)
 [facility] Urodynamics Procedure  Referring Physician: Antoniette Vermell LITTIE DEVONNA Date of Procedure: 09/01/2023  Bridget Johnston is a 63 y.o. female who presents for urodynamic evaluation. Indication(s) for study: occult SUI  Vital Signs: BP 92/61   Pulse 78   Laboratory Results: A catheterized urine specimen revealed:  POC urine:  Lab Results  Component Value Date   COLORU yellow 09/01/2023   CLARITYU clear 09/01/2023   GLUCOSEUR negative 09/01/2023   BILIRUBINUR negative 09/01/2023   KETONESU neg 03/11/2023   SPECGRAV 1.020 09/01/2023   RBCUR negative 09/01/2023   PHUR 6.0 09/01/2023   PROTEINUR Negative 03/11/2023   UROBILINOGEN 0.2 09/01/2023   LEUKOCYTESUR Small (1+) (A) 09/01/2023     Voiding Diary: Deferred  Procedure Timeout:  The correct patient was verified and the correct procedure was verified. The patient was in the correct position and safety precautions were reviewed based on at the patient's history.  Urodynamic Procedure A 10F dual lumen urodynamics catheter was placed under sterile conditions into the patient's bladder. A 10F catheter was placed into the rectum in order to measure abdominal pressure. EMG patches were placed in the appropriate position.  All connections were confirmed and calibrations/adjusted made. Saline was instilled into the bladder through the dual lumen catheters.  Cough/valsalva pressures were measured periodically during filling.  Patient was allowed to void.  The bladder was then emptied of its residual.  UROFLOW: Revealed a Qmax of 10 mL/sec.  She voided 49 mL and had a residual of 5 mL.  It was a normal pattern and represented normal habits though interpretation limited due to low voided volume.  CMG: This was performed with sterile water in the sitting position at a fill rate of 20-30 mL/min.    First sensation of fullness was 18 mLs,  First urge was 74 mLs,  Strong urge was 205 mLs and  Capacity was 309  mLs  Stress incontinence was not demonstrated Highest negative Barrier CLPP was 143 cmH20 at 150 ml. Highest negative Barrier VLPP was 128 cmH20 at 150 ml.  Detrusor function was normal, with phasic contractions seen with standing that did not trigger a terminal DO.  The first occurred at 225 mL to 6 cm of water and was associated with urge.  Compliance:  Normal. End fill detrusor pressure was 1.2cmH20.  Calculated compliance was 257.63mL/cmH20  UPP: MUCP with barrier reduction was 53 cm of water.    MICTURITION STUDY: Voiding was performed with reduction using scopettes in the sitting position.  Pdet at Qmax was 20 cm of water.  Qmax was 21 mL/sec.  It was a normal pattern.  She voided 299 mL and had a residual of 10 mL.  It was a volitional void, sustained detrusor contraction was present and abdominal straining was not present  EMG: This was performed with patches.  She had voluntary contractions, recruitment with fill was not present and urethral sphincter was relaxed with void.  The details of the procedure with the study tracings have been scanned into EPIC.   Urodynamic Impression:  1. Sensation was increased; capacity was low-normal 2. Stress Incontinence was not demonstrated at normal pressures; 3. Detrusor Overactivity was demonstrated with standing without leakage. 4. Emptying was normal with a normal PVR, a sustained detrusor contraction present,  abdominal straining not present, normal urethral sphincter activity on EMG.  Plan: - The patient will follow up  to discuss the findings and treatment options.  -Patient has been reviewing her surgical options and reports  she is leaning more towards a robotic surgery.

## 2023-09-01 NOTE — Progress Notes (Deleted)
proce

## 2023-09-02 LAB — URINE CULTURE

## 2023-09-03 ENCOUNTER — Other Ambulatory Visit: Payer: Self-pay | Admitting: Obstetrics and Gynecology

## 2023-09-03 DIAGNOSIS — R82998 Other abnormal findings in urine: Secondary | ICD-10-CM

## 2023-09-03 LAB — URINE CULTURE: Culture: 80000 — AB

## 2023-09-03 MED ORDER — CIPROFLOXACIN HCL 500 MG PO TABS: 500.0000 mg | ORAL_TABLET | Freq: Two times a day (BID) | ORAL | 0 refills | Status: AC

## 2023-09-03 NOTE — Progress Notes (Signed)
 Called and spoke to patient regarding urine culture results and sent cipro  to pharmacy for patient. She reports understanding and will start antibiotic sent today and stop previous antibiotic.

## 2023-09-07 ENCOUNTER — Encounter: Payer: Self-pay | Admitting: Physician Assistant

## 2023-09-07 DIAGNOSIS — J4530 Mild persistent asthma, uncomplicated: Secondary | ICD-10-CM

## 2023-09-08 MED ORDER — BUDESONIDE-FORMOTEROL FUMARATE 160-4.5 MCG/ACT IN AERO
2.0000 | INHALATION_SPRAY | Freq: Two times a day (BID) | RESPIRATORY_TRACT | 3 refills | Status: DC
Start: 2023-09-08 — End: 2023-12-15

## 2023-09-14 ENCOUNTER — Ambulatory Visit (INDEPENDENT_AMBULATORY_CARE_PROVIDER_SITE_OTHER): Admitting: Obstetrics and Gynecology

## 2023-09-14 ENCOUNTER — Encounter: Payer: Self-pay | Admitting: Obstetrics and Gynecology

## 2023-09-14 VITALS — BP 111/78 | HR 79

## 2023-09-14 DIAGNOSIS — N812 Incomplete uterovaginal prolapse: Secondary | ICD-10-CM | POA: Diagnosis not present

## 2023-09-14 DIAGNOSIS — R300 Dysuria: Secondary | ICD-10-CM | POA: Diagnosis not present

## 2023-09-14 DIAGNOSIS — N3281 Overactive bladder: Secondary | ICD-10-CM

## 2023-09-14 LAB — POCT URINALYSIS DIP (CLINITEK)
Blood, UA: NEGATIVE
Glucose, UA: NEGATIVE mg/dL
Leukocytes, UA: NEGATIVE
Nitrite, UA: NEGATIVE
POC PROTEIN,UA: 30 — AB
Spec Grav, UA: 1.025 (ref 1.010–1.025)
Urobilinogen, UA: 0.2 U/dL
pH, UA: 5.5 (ref 5.0–8.0)

## 2023-09-14 NOTE — Patient Instructions (Signed)
 Plan for surgery: anterior and posterior repair (fixing the vaginal walls), perineorrhaphy (narrowing the vaginal opening), sacrospinous hysteropexy (lifting up the uterus), cystoscopy (look inside of the bladder)

## 2023-09-14 NOTE — Progress Notes (Signed)
 [facility] Return Visit  SUBJECTIVE  History of Present Illness: Bridget Johnston is a 63 y.o. female seen in follow-up for prolapse. She is interested in surgery and underwent urodynamic testing.   Urodynamic Impression:  1. Sensation was increased; capacity was normal 2. Stress Incontinence was not demonstrated at normal pressures; 3. Detrusor Overactivity was demonstrated with standing without leakage. 4. Emptying was normal with a normal PVR, a sustained detrusor contraction present,  abdominal straining not present, normal urethral sphincter activity on EMG.   Recently started mybetriq 25mg  for OAB symptoms. She has noticed a slight improvement with her urgency.   Past Medical History: Patient  has a past medical history of Arthritis, Asthma, Back pain, Diabetes mellitus without complication (HCC), Finger pain, Hand pain, High triglycerides, and Leg pain.   Past Surgical History: She  has a past surgical history that includes Novasure ablation; Tonsillectomy; BTL (1988); Ablation (2007); and Tubal ligation.   Medications: She has a current medication list which includes the following prescription(s): albuterol , AMBULATORY NON FORMULARY MEDICATION, atorvastatin , one touch ultra mini, budesonide -formoterol , buspirone , dicyclomine , estradiol , furosemide , gabapentin , losartan, metoprolol  succinate, mirabegron  er, omega-3 acid ethyl esters, onetouch verio, phenazopyridine , tretinoin , triamcinolone  cream, vilazodone  hcl, vitamin d  (ergocalciferol ), wegovy , and zonisamide .   Allergies: Patient is allergic to prednisolone, measles and rubella virus vaccine, other, jardiance [empagliflozin], adhesive [tape], and prednisone .   Social History: Patient  reports that she quit smoking about 11 years ago. Her smoking use included cigarettes and e-cigarettes. She started smoking about 28 years ago. She has a 17 pack-year smoking history. She has never used smokeless tobacco. She reports  that she does not drink alcohol and does not use drugs.     OBJECTIVE     Physical Exam: Vitals:   09/14/23 0929  BP: 111/78  Pulse: 79   Gen: No apparent distress, A&O x 3.  Detailed Urogynecologic Evaluation:  Normal external genitalia. On speculum, normal vaginal mucosa and normal appearing cervix. On bimanual, uterus is small, mobile and nontender.   Prior exam showed:  POP-Q  0                                            Aa   0                                           Ba  -6.5                                              C   4                                            Gh  4.5                                            Pb  8  tvl   -1                                            Ap  -1                                            Bp  -7                                              D      Results for orders placed or performed in visit on 09/14/23  POCT URINALYSIS DIP (CLINITEK)   Collection Time: 09/14/23  9:43 AM  Result Value Ref Range   Color, UA yellow yellow   Clarity, UA clear clear   Glucose, UA negative negative mg/dL   Bilirubin, UA small (A) negative   Ketones, POC UA trace (5) (A) negative mg/dL   Spec Grav, UA 8.974 8.989 - 1.025   Blood, UA negative negative   pH, UA 5.5 5.0 - 8.0   POC PROTEIN,UA =30 (A) negative, trace   Urobilinogen, UA 0.2 0.2 or 1.0 E.U./dL   Nitrite, UA Negative Negative   Leukocytes, UA Negative Negative    ASSESSMENT AND PLAN    Bridget Johnston is a 63 y.o. with:  1. Uterovaginal prolapse, incomplete   2. Dysuria   3. Overactive bladder     - Continue Myrbetriq  25mg  for now. Can consider increasing dose.  - We discussed two options for prolapse repair:  1) vaginal repair without mesh - Pros - safer, no mesh complications - Cons - not as strong as mesh repair, higher risk of recurrence  2) laparoscopic repair with mesh - Pros - stronger, better long-term success -  Cons - risks of mesh implant (erosion into vagina or bladder, adhering to the rectum, pain) - these risks are lower than with a vaginal mesh but still exist - Since she does not have significant apical prolapse, we discussed the option of hysteropexy. This approach would decrease operative time and potential morbidity.   Plan for surgery: Exam under anesthesia, anterior and posterior repair with perineorrhaphy, sacrospinous hysteropexy, cystoscopy  - We reviewed the patient's specific anatomic and functional findings, with the assistance of diagrams, and together finalized the above procedure. The planned surgical procedures were discussed along with the surgical risks outlined below, which were also provided on a detailed handout. Additional treatment options including expectant management, conservative management, medical management were discussed where appropriate.  We reviewed the benefits and risks of each treatment option.   General Surgical Risks: For all procedures, there are risks of bleeding, infection, damage to surrounding organs including but not limited to bowel, bladder, blood vessels, ureters and nerves, and need for further surgery if an injury were to occur. These risks are all low with minimally invasive surgery.   There are risks of numbness and weakness at any body site or buttock/rectal pain.  It is possible that baseline pain can be worsened by surgery, either with or without mesh. If surgery is vaginal, there is also a low risk of possible conversion to laparoscopy or open abdominal incision where indicated. Very  rare risks include blood transfusion, blood clot, heart attack, pneumonia, or death.   There is also a risk of short-term postoperative urinary retention with need to use a catheter. About half of patients need to go home from surgery with a catheter, which is then later removed in the office. The risk of long-term need for a catheter is very low. There is also a risk of  worsening of overactive bladder.   Prolapse (with or without mesh): Risk factors for surgical failure  include things that put pressure on your pelvis and the surgical repair, including obesity, chronic cough, and heavy lifting or straining (including lifting children or adults, straining on the toilet, or lifting heavy objects such as furniture or anything weighing >25 lbs. Risks of recurrence is 20-30% with vaginal native tissue repair and a less than 10% with sacrocolpopexy with mesh.    - For preop Visit:  She is required to have a visit within 30 days of her surgery.   - Medical clearance: required from Cardiology. Letter sent to Dr Leartis at [facility] requesting risk stratification and medical optimization. - Anticoagulant use: No - Medicaid Hysterectomy form: No - Accepts blood transfusion: Yes - Expected length of stay: outpatient  Request sent for surgery scheduling.   [name], MD

## 2023-10-21 ENCOUNTER — Telehealth: Payer: Self-pay

## 2023-10-21 NOTE — Telephone Encounter (Signed)
 Bridget Johnston has been waiting for a surgery date. She is concerned she has missed a call. She was told she would be scheduled for September. Please advise.

## 2023-11-01 ENCOUNTER — Other Ambulatory Visit: Payer: Self-pay | Admitting: Physician Assistant

## 2023-11-01 DIAGNOSIS — L578 Other skin changes due to chronic exposure to nonionizing radiation: Secondary | ICD-10-CM

## 2023-11-04 ENCOUNTER — Other Ambulatory Visit: Payer: Self-pay

## 2023-11-04 MED ORDER — OMEGA-3-ACID ETHYL ESTERS 1 G PO CAPS: 1.0000 g | ORAL_CAPSULE | Freq: Two times a day (BID) | ORAL | 0 refills | Status: DC

## 2023-11-11 ENCOUNTER — Other Ambulatory Visit: Payer: Self-pay | Admitting: Physician Assistant

## 2023-11-13 ENCOUNTER — Encounter: Payer: Self-pay | Admitting: Physician Assistant

## 2023-11-15 ENCOUNTER — Encounter (HOSPITAL_BASED_OUTPATIENT_CLINIC_OR_DEPARTMENT_OTHER): Payer: Self-pay | Admitting: *Deleted

## 2023-12-15 ENCOUNTER — Encounter: Payer: Self-pay | Admitting: Physician Assistant

## 2023-12-15 DIAGNOSIS — J4530 Mild persistent asthma, uncomplicated: Secondary | ICD-10-CM

## 2023-12-15 MED ORDER — BUDESONIDE-FORMOTEROL FUMARATE 160-4.5 MCG/ACT IN AERO: 2.0000 | INHALATION_SPRAY | Freq: Two times a day (BID) | RESPIRATORY_TRACT | 0 refills | Status: DC

## 2023-12-21 NOTE — Telephone Encounter (Signed)
 Pt has been previously communicated with for her surgery date KD

## 2023-12-29 ENCOUNTER — Ambulatory Visit (INDEPENDENT_AMBULATORY_CARE_PROVIDER_SITE_OTHER): Admitting: Physician Assistant

## 2023-12-29 ENCOUNTER — Encounter: Payer: Self-pay | Admitting: Physician Assistant

## 2023-12-29 VITALS — BP 110/70 | HR 70 | Ht 61.0 in | Wt 192.0 lb

## 2023-12-29 DIAGNOSIS — F419 Anxiety disorder, unspecified: Secondary | ICD-10-CM

## 2023-12-29 DIAGNOSIS — R4701 Aphasia: Secondary | ICD-10-CM

## 2023-12-29 DIAGNOSIS — I5022 Chronic systolic (congestive) heart failure: Secondary | ICD-10-CM

## 2023-12-29 DIAGNOSIS — F32A Depression, unspecified: Secondary | ICD-10-CM | POA: Diagnosis not present

## 2023-12-29 DIAGNOSIS — E782 Mixed hyperlipidemia: Secondary | ICD-10-CM

## 2023-12-29 DIAGNOSIS — K580 Irritable bowel syndrome with diarrhea: Secondary | ICD-10-CM

## 2023-12-29 DIAGNOSIS — R4789 Other speech disturbances: Secondary | ICD-10-CM

## 2023-12-29 DIAGNOSIS — E559 Vitamin D deficiency, unspecified: Secondary | ICD-10-CM

## 2023-12-29 DIAGNOSIS — R404 Transient alteration of awareness: Secondary | ICD-10-CM | POA: Diagnosis not present

## 2023-12-29 DIAGNOSIS — Z6837 Body mass index (BMI) 37.0-37.9, adult: Secondary | ICD-10-CM

## 2023-12-29 DIAGNOSIS — I10 Essential (primary) hypertension: Secondary | ICD-10-CM

## 2023-12-29 DIAGNOSIS — E8881 Metabolic syndrome: Secondary | ICD-10-CM

## 2023-12-29 DIAGNOSIS — R7303 Prediabetes: Secondary | ICD-10-CM

## 2023-12-29 DIAGNOSIS — J4530 Mild persistent asthma, uncomplicated: Secondary | ICD-10-CM

## 2023-12-29 DIAGNOSIS — M797 Fibromyalgia: Secondary | ICD-10-CM

## 2023-12-29 DIAGNOSIS — Z6836 Body mass index (BMI) 36.0-36.9, adult: Secondary | ICD-10-CM

## 2023-12-29 MED ORDER — VITAMIN D (ERGOCALCIFEROL) 1.25 MG (50000 UNIT) PO CAPS: 50000.0000 [IU] | ORAL_CAPSULE | ORAL | 4 refills | Status: AC

## 2023-12-29 MED ORDER — AIRSUPRA 90-80 MCG/ACT IN AERO: 2.0000 | INHALATION_SPRAY | Freq: Four times a day (QID) | RESPIRATORY_TRACT | 1 refills | Status: AC | PRN

## 2023-12-29 MED ORDER — OMEGA-3-ACID ETHYL ESTERS 1 G PO CAPS: 2.0000 g | ORAL_CAPSULE | Freq: Two times a day (BID) | ORAL | 4 refills | Status: AC

## 2023-12-29 MED ORDER — ATORVASTATIN CALCIUM 40 MG PO TABS: 40.0000 mg | ORAL_TABLET | Freq: Every day | ORAL | 3 refills | Status: AC

## 2023-12-29 MED ORDER — BUSPIRONE HCL 15 MG PO TABS: ORAL_TABLET | ORAL | 1 refills | Status: AC

## 2023-12-29 MED ORDER — BUDESONIDE-FORMOTEROL FUMARATE 160-4.5 MCG/ACT IN AERO: 2.0000 | INHALATION_SPRAY | Freq: Two times a day (BID) | RESPIRATORY_TRACT | 5 refills | Status: DC

## 2023-12-29 MED ORDER — ZONISAMIDE 100 MG PO CAPS: ORAL_CAPSULE | ORAL | 1 refills | Status: DC

## 2023-12-29 MED ORDER — DICYCLOMINE HCL 20 MG PO TABS: 20.0000 mg | ORAL_TABLET | Freq: Three times a day (TID) | ORAL | 1 refills | Status: AC

## 2023-12-29 MED ORDER — GABAPENTIN 300 MG PO CAPS: 300.0000 mg | ORAL_CAPSULE | Freq: Four times a day (QID) | ORAL | 1 refills | Status: AC

## 2023-12-29 MED ORDER — WEGOVY 2.4 MG/0.75ML ~~LOC~~ SOAJ: 2.4000 mg | SUBCUTANEOUS | 3 refills | Status: DC

## 2023-12-29 MED ORDER — METOPROLOL SUCCINATE ER 25 MG PO TB24: 25.0000 mg | ORAL_TABLET | Freq: Every day | ORAL | 3 refills | Status: AC

## 2023-12-29 NOTE — Progress Notes (Signed)
 Established Patient Office Visit  Subjective   Patient ID: Bridget Johnston, female    DOB: 31-Oct-1960  Age: 63 y.o. MRN: [id number]   Patient is a 63 yo female presenting for a follow up for weight management. She currently takes WEGOVY  2.4mg /0.48ml as maintenance for weight management. She has lost 4 pounds since a past visit in May. She states she has no problems on the WEGOVY  besides some diarrhea, but this could be due to her IBS diagnosis as well. Pt is on DICYCLOMINE  for diarrhea. She has no other complaints at this time. Patient will be undergoing surgery on 11/05 for collapse of her pelvic floor. She is concerned about her history of opioid dependence and upcoming surgery.    Review of Systems  Constitutional: Negative.        Patient has intentional weight loss of 4 lbs while on maintenance dose of Wegovy .   Respiratory:  Negative for shortness of breath.   Cardiovascular:  Negative for chest pain.  Gastrointestinal:  Positive for diarrhea and nausea. Negative for abdominal pain, constipation and vomiting.  Musculoskeletal:  Negative for myalgias.       Pt stopped seeing ortho for ulnar nerve entrapment due to a lack of symptoms. Pain from fibromyalgia has gotten better as well.  Psychiatric/Behavioral: Negative.    All other systems reviewed and are negative.     Objective:     BP 110/70   Pulse 70   Ht 5' 1 (1.549 m)   Wt 192 lb (87.1 kg)   SpO2 99%   BMI 36.28 kg/m  BP Readings from Last 3 Encounters:  12/29/23 110/70  09/14/23 111/78  09/01/23 92/61   Wt Readings from Last 3 Encounters:  12/29/23 192 lb (87.1 kg)  07/30/23 196 lb 6.4 oz (89.1 kg)  07/02/23 194 lb (88 kg)    ..    12/29/2023    4:56 PM 07/02/2023   11:18 AM 12/25/2022    9:38 AM 06/19/2022    8:50 AM 03/05/2022    8:50 AM  Depression screen PHQ 2/9  Decreased Interest 0 0 0 0 0  Down, Depressed, Hopeless 1 0 0 0 0  PHQ - 2 Score 1 0 0 0 0  Altered sleeping 2 0  3 0  Tired,  decreased energy 1 0  1 0  Change in appetite 0 0  0 0  Feeling bad or failure about yourself  0 0  0 0  Trouble concentrating 0 0  0 0  Moving slowly or fidgety/restless  0  0 0  Suicidal thoughts  0  0 0  PHQ-9 Score 4 0  4 0  Difficult doing work/chores  Not difficult at all  Not difficult at all Not difficult at all   .Bridget Johnston    01/03/2024    5:53 AM 07/02/2023   11:18 AM 06/19/2022    9:00 AM 03/05/2022    8:51 AM  GAD 7 : Generalized Anxiety Score  Nervous, Anxious, on Edge 0 0 2 3  Control/stop worrying 0 0 3 3  Worry too much - different things 0 0 3 3  Trouble relaxing 0 0 0 3  Restless 0 0 1 2  Easily annoyed or irritable 0 0 1 2  Afraid - awful might happen 0 0 2 3  Total GAD 7 Score 0 0 12 19  Anxiety Difficulty Not difficult at all Not difficult at all Somewhat difficult Not difficult at all  Physical Exam Vitals reviewed.  Constitutional:      Appearance: Normal appearance. She is obese.  HENT:     Head: Normocephalic and atraumatic.  Cardiovascular:     Rate and Rhythm: Normal rate and regular rhythm.     Heart sounds: Normal heart sounds.  Pulmonary:     Effort: Pulmonary effort is normal.     Breath sounds: Normal breath sounds.  Abdominal:     General: Bowel sounds are normal.     Palpations: Abdomen is soft.  Neurological:     General: No focal deficit present.     Mental Status: She is alert and oriented to person, place, and time.  Psychiatric:        Mood and Affect: Mood normal.        Behavior: Behavior normal.        Thought Content: Thought content normal.        Judgment: Judgment normal.         Assessment & Plan:  [name] was seen today for medical management of chronic issues.  Diagnoses and all orders for this visit:  Class 2 severe obesity due to excess calories with serious comorbidity and body mass index (BMI) of 36.0 to 36.9 in adult  Anxiety and depression -     busPIRone  (BUSPAR ) 15 MG tablet; TAKE 1 TABLET(15 MG) BY  MOUTH THREE TIMES DAILY  Aphasia -     zonisamide  (ZONEGRAN ) 100 MG capsule; TAKE 2 CAPSULES(200 MG) BY MOUTH AT BEDTIME -     atorvastatin  (LIPITOR) 40 MG tablet; Take 1 tablet (40 mg total) by mouth daily. TAKE 1 TABLET(40 MG) BY MOUTH DAILY  Awareness alteration, transient -     zonisamide  (ZONEGRAN ) 100 MG capsule; TAKE 2 CAPSULES(200 MG) BY MOUTH AT BEDTIME  Mixed hyperlipidemia -     atorvastatin  (LIPITOR) 40 MG tablet; Take 1 tablet (40 mg total) by mouth daily. TAKE 1 TABLET(40 MG) BY MOUTH DAILY -     WEGOVY  2.4 MG/0.75ML SOAJ SQ injection; Inject 2.4 mg into the skin once a week. -     Lipid panel  Word finding difficulty -     atorvastatin  (LIPITOR) 40 MG tablet; Take 1 tablet (40 mg total) by mouth daily. TAKE 1 TABLET(40 MG) BY MOUTH DAILY  Chronic systolic (congestive) heart failure (HCC) -     metoprolol  succinate (TOPROL -XL) 25 MG 24 hr tablet; Take 1 tablet (25 mg total) by mouth daily. -     WEGOVY  2.4 MG/0.75ML SOAJ SQ injection; Inject 2.4 mg into the skin once a week.  Prediabetes -     WEGOVY  2.4 MG/0.75ML SOAJ SQ injection; Inject 2.4 mg into the skin once a week. -     omega-3 acid ethyl esters (LOVAZA ) 1 g capsule; Take 2 capsules (2 g total) by mouth 2 (two) times daily. -     CMP14+EGFR -     Hemoglobin A1c  Class 2 severe obesity due to excess calories with serious comorbidity and body mass index (BMI) of 37.0 to 37.9 in adult -     WEGOVY  2.4 MG/0.75ML SOAJ SQ injection; Inject 2.4 mg into the skin once a week.  Metabolic syndrome -     WEGOVY  2.4 MG/0.75ML SOAJ SQ injection; Inject 2.4 mg into the skin once a week. -     omega-3 acid ethyl esters (LOVAZA ) 1 g capsule; Take 2 capsules (2 g total) by mouth 2 (two) times daily. -     Lipid panel -  CMP14+EGFR -     CBC w/Diff/Platelet -     TSH + free T4 -     B12 and Folate Panel -     VITAMIN D  25 Hydroxy (Vit-D Deficiency, Fractures) -     Fe+TIBC+Fer -     Hemoglobin A1c  Essential  hypertension -     WEGOVY  2.4 MG/0.75ML SOAJ SQ injection; Inject 2.4 mg into the skin once a week. -     CMP14+EGFR  Fibromyalgia -     gabapentin  (NEURONTIN ) 300 MG capsule; Take 1 capsule (300 mg total) by mouth 4 (four) times daily.  Mild persistent asthma without complication -     budesonide -formoterol  (SYMBICORT ) 160-4.5 MCG/ACT inhaler; Inhale 2 puffs into the lungs 2 (two) times daily. -     Albuterol -Budesonide  (AIRSUPRA) 90-80 MCG/ACT AERO; Inhale 2 puffs into the lungs every 6 (six) hours as needed.  Vitamin D  deficiency -     Vitamin D , Ergocalciferol , (DRISDOL ) 1.25 MG (50000 UNIT) CAPS capsule; Take 1 capsule (50,000 Units total) by mouth every 7 (seven) days. TAKE 1 CAPSULE BY MOUTH EVERY 7 DAYS. -     VITAMIN D  25 Hydroxy (Vit-D Deficiency, Fractures)  Irritable bowel syndrome with diarrhea -     dicyclomine  (BENTYL ) 20 MG tablet; Take 1 tablet (20 mg total) by mouth 4 (four) times daily -  before meals and at bedtime.    Will send in medication refills for Symbicort , Wegovy , and Dicyclomine  Sent prescription for AirSupra for rescue inhaler and discontinue use of Albuterol  inhaler  Corrected the incorrect prescription of Omega-3 Acid Pills and refilled the medication Pt continues to lose weight today and tolerating wegovy  well Continue with 2.4mg  weekly wegovy  and regular diet and exercise Completed labs today and will be notified when resulted.  PHQ/GAD is negative. Continue Buspar  as directed.  Reviewed potential post-surgical complications regarding pain management and made a plan due to patients history of opioid dependence.    Return in about 6 months (around 06/28/2024).    Wyman Meschke, PA-C

## 2023-12-29 NOTE — Patient Instructions (Signed)
 Get labs.  Refilled medication.

## 2023-12-30 ENCOUNTER — Ambulatory Visit: Payer: Self-pay | Admitting: Physician Assistant

## 2023-12-30 LAB — CBC WITH DIFFERENTIAL/PLATELET
Basophils Absolute: 0.1 x10E3/uL (ref 0.0–0.2)
Basos: 1 %
EOS (ABSOLUTE): 0.1 x10E3/uL (ref 0.0–0.4)
Eos: 2 %
Hematocrit: 39.8 % (ref 34.0–46.6)
Hemoglobin: 13.3 g/dL (ref 11.1–15.9)
Immature Grans (Abs): 0 x10E3/uL (ref 0.0–0.1)
Immature Granulocytes: 0 %
Lymphocytes Absolute: 1.9 x10E3/uL (ref 0.7–3.1)
Lymphs: 34 %
MCH: 33.3 pg — ABNORMAL HIGH (ref 26.6–33.0)
MCHC: 33.4 g/dL (ref 31.5–35.7)
MCV: 100 fL — ABNORMAL HIGH (ref 79–97)
Monocytes Absolute: 0.4 x10E3/uL (ref 0.1–0.9)
Monocytes: 8 %
Neutrophils Absolute: 3.1 x10E3/uL (ref 1.4–7.0)
Neutrophils: 55 %
Platelets: 280 x10E3/uL (ref 150–450)
RBC: 3.99 x10E6/uL (ref 3.77–5.28)
RDW: 11.8 % (ref 11.7–15.4)
WBC: 5.6 x10E3/uL (ref 3.4–10.8)

## 2023-12-30 LAB — CMP14+EGFR
ALT: 15 IU/L (ref 0–32)
AST: 19 IU/L (ref 0–40)
Albumin: 4.3 g/dL (ref 3.9–4.9)
Alkaline Phosphatase: 53 IU/L (ref 49–135)
BUN/Creatinine Ratio: 16 (ref 12–28)
BUN: 15 mg/dL (ref 8–27)
Bilirubin Total: 0.5 mg/dL (ref 0.0–1.2)
CO2: 20 mmol/L (ref 20–29)
Calcium: 9.4 mg/dL (ref 8.7–10.3)
Chloride: 107 mmol/L — ABNORMAL HIGH (ref 96–106)
Creatinine, Ser: 0.95 mg/dL (ref 0.57–1.00)
Globulin, Total: 1.7 g/dL (ref 1.5–4.5)
Glucose: 89 mg/dL (ref 70–99)
Potassium: 4.3 mmol/L (ref 3.5–5.2)
Sodium: 142 mmol/L (ref 134–144)
Total Protein: 6 g/dL (ref 6.0–8.5)
eGFR: 67 mL/min/1.73 (ref >59)

## 2023-12-30 LAB — B12 AND FOLATE PANEL
Folate: 13.9 ng/mL (ref >3.0)
Vitamin B-12: 731 pg/mL (ref 232–1245)

## 2023-12-30 LAB — IRON,TIBC AND FERRITIN PANEL
Ferritin: 30 ng/mL (ref 15–150)
Iron Saturation: 48 % (ref 15–55)
Iron: 144 ug/dL — ABNORMAL HIGH (ref 27–139)
Total Iron Binding Capacity: 298 ug/dL (ref 250–450)
UIBC: 154 ug/dL (ref 118–369)

## 2023-12-30 LAB — LIPID PANEL
Chol/HDL Ratio: 2.5 ratio (ref 0.0–4.4)
Cholesterol, Total: 148 mg/dL (ref 100–199)
HDL: 60 mg/dL (ref >39)
LDL Chol Calc (NIH): 71 mg/dL (ref 0–99)
Triglycerides: 88 mg/dL (ref 0–149)
VLDL Cholesterol Cal: 17 mg/dL (ref 5–40)

## 2023-12-30 LAB — HEMOGLOBIN A1C
Est. average glucose Bld gHb Est-mCnc: 91 mg/dL
Hgb A1c MFr Bld: 4.8 % (ref 4.8–5.6)

## 2023-12-30 LAB — TSH+FREE T4
Free T4: 1.09 ng/dL (ref 0.82–1.77)
TSH: 0.519 u[IU]/mL (ref 0.450–4.500)

## 2023-12-30 LAB — VITAMIN D 25 HYDROXY (VIT D DEFICIENCY, FRACTURES): Vit D, 25-Hydroxy: 60.4 ng/mL (ref 30.0–100.0)

## 2023-12-30 NOTE — Progress Notes (Signed)
 Bridget Johnston,   Cholesterol looks GREAT.  Thyroid normal.  B12 and vitamin D  look amazing.  A1C to goal.  Serum iron up some, don't need to take any extra iron.

## 2023-12-31 ENCOUNTER — Ambulatory Visit: Admitting: Physician Assistant

## 2024-01-03 ENCOUNTER — Telehealth: Payer: Self-pay

## 2024-01-03 ENCOUNTER — Other Ambulatory Visit (HOSPITAL_COMMUNITY): Payer: Self-pay

## 2024-01-03 ENCOUNTER — Encounter: Payer: Self-pay | Admitting: Physician Assistant

## 2024-01-03 NOTE — Telephone Encounter (Signed)
 Copied from CRM #[id number]. Topic: Clinical - Medication Prior Auth >> Jan 03, 2024  1:51 PM Delon DASEN wrote: Reason for CRM: Isaiah with [employer], need clinical information for the PA for Albuterol -Budesonide  (AIRSUPRA)- please call [phone]

## 2024-01-03 NOTE — Telephone Encounter (Signed)
 Pharmacy Patient Advocate Encounter   Received notification from CoverMyMeds that prior authorization for Airsupra 90-80mcg is required/requested.   Insurance verification completed.   The patient is insured through [facility].   Per test claim: PA required; PA submitted to above mentioned insurance via Latent Key/confirmation #/EOC [id number] Status is pending

## 2024-01-04 ENCOUNTER — Ambulatory Visit: Admitting: Obstetrics and Gynecology

## 2024-01-04 ENCOUNTER — Other Ambulatory Visit: Payer: Self-pay | Admitting: Obstetrics and Gynecology

## 2024-01-04 ENCOUNTER — Other Ambulatory Visit (HOSPITAL_COMMUNITY): Payer: Self-pay

## 2024-01-04 ENCOUNTER — Encounter: Payer: Self-pay | Admitting: Obstetrics and Gynecology

## 2024-01-04 VITALS — BP 117/71 | HR 78

## 2024-01-04 DIAGNOSIS — N8110 Cystocele, unspecified: Secondary | ICD-10-CM

## 2024-01-04 DIAGNOSIS — N812 Incomplete uterovaginal prolapse: Secondary | ICD-10-CM

## 2024-01-04 DIAGNOSIS — Z01818 Encounter for other preprocedural examination: Secondary | ICD-10-CM

## 2024-01-04 MED ORDER — POLYETHYLENE GLYCOL 3350 17 GM/SCOOP PO POWD: 17.0000 g | Freq: Every day | ORAL | 0 refills | Status: AC

## 2024-01-04 MED ORDER — OXYCODONE HCL 5 MG PO TABS: 5.0000 mg | ORAL_TABLET | ORAL | 0 refills | Status: DC | PRN

## 2024-01-04 MED ORDER — IBUPROFEN 600 MG PO TABS
600.0000 mg | ORAL_TABLET | Freq: Four times a day (QID) | ORAL | 0 refills | Status: AC | PRN
Start: 2024-01-04 — End: ?

## 2024-01-04 MED ORDER — ACETAMINOPHEN 500 MG PO TABS: 500.0000 mg | ORAL_TABLET | Freq: Four times a day (QID) | ORAL | 0 refills | Status: DC | PRN

## 2024-01-04 NOTE — Telephone Encounter (Signed)
 Pharmacy Patient Advocate Encounter  Received notification from [employer] that Prior Authorization for Airsupra 90-80mcg has been CANCELLED due to no prior authorization is required. Per the rep, there was a paid claim on 12/31/23 at [facility] and the next fill is 01/23/24.   PA #/Case ID/Reference #: [id number]  Letter from the insurance has been indexed to the media tab.

## 2024-01-04 NOTE — H&P (Signed)
 [facility] H&P  Subjective Chief Complaint: Bridget Johnston presents for a preoperative encounter.   History of Present Illness: Bridget Johnston is a 63 y.o. female who presents for preoperative visit.  She is scheduled to undergo Exam under anesthesia, anterior and posterior repair with perineorrhaphy, sacrospinous hysteropexy, cystoscopy  on 01/22/24.  Her symptoms include pelvic organ prolapse, and she was was found to have Stage II anterior, Stage I posterior, Stage I apical prolapse.   Urodynamics showed: 1. Sensation was increased; capacity was low-normal 2. Stress Incontinence was not demonstrated at normal pressures; 3. Detrusor Overactivity was demonstrated with standing without leakage. 4. Emptying was normal with a normal PVR, a sustained detrusor contraction present,  abdominal straining not present, normal urethral sphincter activity on EMG.  Past Medical History:  Diagnosis Date   Arthritis    Asthma    Back pain    Diabetes mellitus without complication (HCC)    Finger pain    Hand pain    High triglycerides    Leg pain      Past Surgical History:  Procedure Laterality Date   ABLATION  2007   BTL  1988   NOVASURE ABLATION     TONSILLECTOMY     TUBAL LIGATION      is allergic to prednisolone, measles and rubella virus vaccine, other, jardiance [empagliflozin], adhesive [tape], and prednisone .   Family History  Problem Relation Age of Onset   Heart disease Mother    Hypertension Mother    Heart disease Father    Diabetes Father    Hypertension Father    Renal Disease Father    Cancer - Colon Maternal Grandfather    Arthritis Other    Diabetes Other    Uterine cancer Neg Hx    Bladder Cancer Neg Hx    Renal cancer Neg Hx     Social History   Tobacco Use   Smoking status: Former    Current packs/day: 0.00    Average packs/day: 1 pack/day for 17.0 years (17.0 ttl pk-yrs)    Types: Cigarettes, E-cigarettes    Start date: 01/06/1995     Quit date: 01/06/2012    Years since quitting: 12.0   Smokeless tobacco: Never  Vaping Use   Vaping status: Never Used  Substance Use Topics   Alcohol use: No    Alcohol/week: 0.0 standard drinks of alcohol   Drug use: No    Comment: opiod adduction     Review of Systems was negative for a full 10 system review except as noted in the History of Present Illness.  No current facility-administered medications for this encounter.  Current Outpatient Medications:    acetaminophen  (TYLENOL ) 500 MG tablet, Take 1 tablet (500 mg total) by mouth every 6 (six) hours as needed (pain)., Disp: 30 tablet, Rfl: 0   albuterol  (VENTOLIN  HFA) 108 (90 Base) MCG/ACT inhaler, Inhale 2 puffs into the lungs every 4 (four) hours as needed for wheezing or shortness of breath., Disp: 6.7 g, Rfl: 1   Albuterol -Budesonide  (AIRSUPRA) 90-80 MCG/ACT AERO, Inhale 2 puffs into the lungs every 6 (six) hours as needed., Disp: 32.1 g, Rfl: 1   AMBULATORY NON FORMULARY MEDICATION, Take 1 tablet by mouth daily. Patient reports taking wheat grass to help control her glucose., Disp: , Rfl:    atorvastatin  (LIPITOR) 40 MG tablet, Take 1 tablet (40 mg total) by mouth daily. TAKE 1 TABLET(40 MG) BY MOUTH DAILY, Disp: 90 tablet, Rfl: 3   Blood  Glucose Monitoring Suppl (ONE TOUCH ULTRA MINI) w/Device KIT, Check fasting blood sugar daily., Disp: 1 each, Rfl: 0   budesonide -formoterol  (SYMBICORT ) 160-4.5 MCG/ACT inhaler, Inhale 2 puffs into the lungs 2 (two) times daily., Disp: 10.2 g, Rfl: 5   busPIRone  (BUSPAR ) 15 MG tablet, TAKE 1 TABLET(15 MG) BY MOUTH THREE TIMES DAILY, Disp: 270 tablet, Rfl: 1   dicyclomine  (BENTYL ) 20 MG tablet, Take 1 tablet (20 mg total) by mouth 4 (four) times daily -  before meals and at bedtime., Disp: 90 tablet, Rfl: 1   estradiol  (ESTRACE ) 0.1 MG/GM vaginal cream, Place 0.5 g vaginally 2 (two) times a week. Place 0.5g nightly for two weeks then twice a week after, Disp: 42.5 g, Rfl: 11   furosemide   (LASIX ) 20 MG tablet, TAKE 1 TABLET(20 MG) BY MOUTH DAILY AS NEEDED FOR FLUID RETENTION OR SWELLING, Disp: 30 tablet, Rfl: 5   gabapentin  (NEURONTIN ) 300 MG capsule, Take 1 capsule (300 mg total) by mouth 4 (four) times daily., Disp: 360 capsule, Rfl: 1   ibuprofen (ADVIL) 600 MG tablet, Take 1 tablet (600 mg total) by mouth every 6 (six) hours as needed., Disp: 30 tablet, Rfl: 0   losartan (COZAAR) 25 MG tablet, Take 50 mg by mouth daily., Disp: , Rfl:    metoprolol  succinate (TOPROL -XL) 25 MG 24 hr tablet, Take 1 tablet (25 mg total) by mouth daily., Disp: 90 tablet, Rfl: 3   omega-3 acid ethyl esters (LOVAZA ) 1 g capsule, Take 2 capsules (2 g total) by mouth 2 (two) times daily., Disp: 360 capsule, Rfl: 4   ONETOUCH VERIO test strip, USE TO TEST BLOOD SUGAR DAILY, Disp: 100 strip, Rfl: 1   oxyCODONE (OXY IR/ROXICODONE) 5 MG immediate release tablet, Take 1 tablet (5 mg total) by mouth every 4 (four) hours as needed for severe pain (pain score 7-10)., Disp: 15 tablet, Rfl: 0   polyethylene glycol powder (GLYCOLAX/MIRALAX) 17 GM/SCOOP powder, Take 17 g by mouth daily for 15 days. Drink 17g (1 scoop) dissolved in water per day., Disp: 255 g, Rfl: 0   tretinoin  (RETIN-A ) 0.1 % cream, APPLY TOPICALLY TO THE AFFECTED AREA AT BEDTIME, Disp: 40 g, Rfl: 0   triamcinolone  cream (KENALOG ) 0.1 %, APPLY TOPICALLY TO THE AFFECTED AREA TWICE DAILY, Disp: 80 g, Rfl: 1   Vilazodone  HCl (VIIBRYD ) 40 MG TABS, Take 1 tablet (40 mg total) by mouth daily., Disp: 90 tablet, Rfl: 3   Vitamin D , Ergocalciferol , (DRISDOL ) 1.25 MG (50000 UNIT) CAPS capsule, Take 1 capsule (50,000 Units total) by mouth every 7 (seven) days. TAKE 1 CAPSULE BY MOUTH EVERY 7 DAYS., Disp: 12 capsule, Rfl: 4   WEGOVY  2.4 MG/0.75ML SOAJ SQ injection, Inject 2.4 mg into the skin once a week., Disp: 9 mL, Rfl: 3   zonisamide  (ZONEGRAN ) 100 MG capsule, TAKE 2 CAPSULES(200 MG) BY MOUTH AT BEDTIME, Disp: 180 capsule, Rfl: 1   Objective There were no  vitals filed for this visit.  Gen: NAD CV: S1 S2 RRR Lungs: Clear to auscultation bilaterally Abd: soft, nontender   Previous Pelvic Exam showed: POP-Q   0                                            Aa   0  Ba   -6.5                                              C    4                                            Gh   4.5                                            Pb   8                                            tvl    -1                                            Ap   -1                                            Bp   -7                                                   Assessment/ Plan  Assessment: The patient is a 63 y.o. year old scheduled to undergo Exam under anesthesia, anterior and posterior repair with perineorrhaphy, sacrospinous hysteropexy, cystoscopy. Verbal consent was obtained for these procedures.

## 2024-01-04 NOTE — Progress Notes (Signed)
 [facility] Pre-Operative Exam  Subjective Chief Complaint: Bridget Johnston presents for a preoperative encounter.   History of Present Illness: Bridget Johnston is a 63 y.o. female who presents for preoperative visit.  She is scheduled to undergo Exam under anesthesia, anterior and posterior repair with perineorrhaphy, sacrospinous hysteropexy, cystoscopy  on 01/22/24.  Her symptoms include pelvic organ prolapse, and she was was found to have Stage II anterior, Stage I posterior, Stage I apical prolapse.   Urodynamics showed: 1. Sensation was increased; capacity was low-normal 2. Stress Incontinence was not demonstrated at normal pressures; 3. Detrusor Overactivity was demonstrated with standing without leakage. 4. Emptying was normal with a normal PVR, a sustained detrusor contraction present,  abdominal straining not present, normal urethral sphincter activity on EMG.  Past Medical History:  Diagnosis Date   Arthritis    Asthma    Back pain    Diabetes mellitus without complication (HCC)    Finger pain    Hand pain    High triglycerides    Leg pain      Past Surgical History:  Procedure Laterality Date   ABLATION  2007   BTL  1988   NOVASURE ABLATION     TONSILLECTOMY     TUBAL LIGATION      is allergic to prednisolone, measles and rubella virus vaccine, other, jardiance [empagliflozin], adhesive [tape], and prednisone .   Family History  Problem Relation Age of Onset   Heart disease Mother    Hypertension Mother    Heart disease Father    Diabetes Father    Hypertension Father    Renal Disease Father    Cancer - Colon Maternal Grandfather    Arthritis Other    Diabetes Other    Uterine cancer Neg Hx    Bladder Cancer Neg Hx    Renal cancer Neg Hx     Social History   Tobacco Use   Smoking status: Former    Current packs/day: 0.00    Average packs/day: 1 pack/day for 17.0 years (17.0 ttl pk-yrs)    Types: Cigarettes, E-cigarettes    Start  date: 01/06/1995    Quit date: 01/06/2012    Years since quitting: 12.0   Smokeless tobacco: Never  Vaping Use   Vaping status: Never Used  Substance Use Topics   Alcohol use: No    Alcohol/week: 0.0 standard drinks of alcohol   Drug use: No    Comment: opiod adduction     Review of Systems was negative for a full 10 system review except as noted in the History of Present Illness.   Current Outpatient Medications:    albuterol  (VENTOLIN  HFA) 108 (90 Base) MCG/ACT inhaler, Inhale 2 puffs into the lungs every 4 (four) hours as needed for wheezing or shortness of breath., Disp: 6.7 g, Rfl: 1   Albuterol -Budesonide  (AIRSUPRA) 90-80 MCG/ACT AERO, Inhale 2 puffs into the lungs every 6 (six) hours as needed., Disp: 32.1 g, Rfl: 1   AMBULATORY NON FORMULARY MEDICATION, Take 1 tablet by mouth daily. Patient reports taking wheat grass to help control her glucose., Disp: , Rfl:    atorvastatin  (LIPITOR) 40 MG tablet, Take 1 tablet (40 mg total) by mouth daily. TAKE 1 TABLET(40 MG) BY MOUTH DAILY, Disp: 90 tablet, Rfl: 3   Blood Glucose Monitoring Suppl (ONE TOUCH ULTRA MINI) w/Device KIT, Check fasting blood sugar daily., Disp: 1 each, Rfl: 0   budesonide -formoterol  (SYMBICORT ) 160-4.5 MCG/ACT inhaler, Inhale 2 puffs into the lungs 2 (  two) times daily., Disp: 10.2 g, Rfl: 5   busPIRone  (BUSPAR ) 15 MG tablet, TAKE 1 TABLET(15 MG) BY MOUTH THREE TIMES DAILY, Disp: 270 tablet, Rfl: 1   dicyclomine  (BENTYL ) 20 MG tablet, Take 1 tablet (20 mg total) by mouth 4 (four) times daily -  before meals and at bedtime., Disp: 90 tablet, Rfl: 1   estradiol  (ESTRACE ) 0.1 MG/GM vaginal cream, Place 0.5 g vaginally 2 (two) times a week. Place 0.5g nightly for two weeks then twice a week after, Disp: 42.5 g, Rfl: 11   furosemide  (LASIX ) 20 MG tablet, TAKE 1 TABLET(20 MG) BY MOUTH DAILY AS NEEDED FOR FLUID RETENTION OR SWELLING, Disp: 30 tablet, Rfl: 5   gabapentin  (NEURONTIN ) 300 MG capsule, Take 1 capsule (300 mg  total) by mouth 4 (four) times daily., Disp: 360 capsule, Rfl: 1   losartan (COZAAR) 25 MG tablet, Take 50 mg by mouth daily., Disp: , Rfl:    metoprolol  succinate (TOPROL -XL) 25 MG 24 hr tablet, Take 1 tablet (25 mg total) by mouth daily., Disp: 90 tablet, Rfl: 3   omega-3 acid ethyl esters (LOVAZA ) 1 g capsule, Take 2 capsules (2 g total) by mouth 2 (two) times daily., Disp: 360 capsule, Rfl: 4   ONETOUCH VERIO test strip, USE TO TEST BLOOD SUGAR DAILY, Disp: 100 strip, Rfl: 1   tretinoin  (RETIN-A ) 0.1 % cream, APPLY TOPICALLY TO THE AFFECTED AREA AT BEDTIME, Disp: 40 g, Rfl: 0   triamcinolone  cream (KENALOG ) 0.1 %, APPLY TOPICALLY TO THE AFFECTED AREA TWICE DAILY, Disp: 80 g, Rfl: 1   Vilazodone  HCl (VIIBRYD ) 40 MG TABS, Take 1 tablet (40 mg total) by mouth daily., Disp: 90 tablet, Rfl: 3   Vitamin D , Ergocalciferol , (DRISDOL ) 1.25 MG (50000 UNIT) CAPS capsule, Take 1 capsule (50,000 Units total) by mouth every 7 (seven) days. TAKE 1 CAPSULE BY MOUTH EVERY 7 DAYS., Disp: 12 capsule, Rfl: 4   WEGOVY  2.4 MG/0.75ML SOAJ SQ injection, Inject 2.4 mg into the skin once a week., Disp: 9 mL, Rfl: 3   zonisamide  (ZONEGRAN ) 100 MG capsule, TAKE 2 CAPSULES(200 MG) BY MOUTH AT BEDTIME, Disp: 180 capsule, Rfl: 1   Objective There were no vitals filed for this visit.  Gen: NAD CV: S1 S2 RRR Lungs: Clear to auscultation bilaterally Abd: soft, nontender   Previous Pelvic Exam showed: POP-Q   0                                            Aa   0                                           Ba   -6.5                                              C    4                                            Gh   4.5  Pb   8                                            tvl    -1                                            Ap   -1                                            Bp   -7                                                   Assessment/ Plan  Assessment: The  patient is a 63 y.o. year old scheduled to undergo Exam under anesthesia, anterior and posterior repair with perineorrhaphy, sacrospinous hysteropexy, cystoscopy. Verbal consent was obtained for these procedures.  Plan: General Surgical Consent: The patient has previously been counseled on alternative treatments, and the decision by the patient and provider was to proceed with the procedure listed above.  For all procedures, there are risks of bleeding, infection, damage to surrounding organs including but not limited to bowel, bladder, blood vessels, ureters and nerves, and need for further surgery if an injury were to occur. These risks are all low with minimally invasive surgery.   There are risks of numbness and weakness at any body site or buttock/rectal pain.  It is possible that baseline pain can be worsened by surgery, either with or without mesh. If surgery is vaginal, there is also a low risk of possible conversion to laparoscopy or open abdominal incision where indicated. Very rare risks include blood transfusion, blood clot, heart attack, pneumonia, or death.   There is also a risk of short-term postoperative urinary retention with need to use a catheter. About half of patients need to go home from surgery with a catheter, which is then later removed in the office. The risk of long-term need for a catheter is very low. There is also a risk of worsening of overactive bladder.   Prolapse (with or without mesh): Risk factors for surgical failure  include things that put pressure on your pelvis and the surgical repair, including obesity, chronic cough, and heavy lifting or straining (including lifting children or adults, straining on the toilet, or lifting heavy objects such as furniture or anything weighing >25 lbs. Risks of recurrence is 20-30% with vaginal native tissue repair and a less than 10% with sacrocolpopexy with mesh.     We discussed consent for blood products. Risks for blood  transfusion include allergic reactions, other reactions that can affect different body organs and managed accordingly, transmission of infectious diseases such as HIV or Hepatitis. However, the blood is screened. Patient consents for blood products.  Pre-operative instructions:  She was instructed to not take Aspirin /NSAIDs x 7days prior to surgery.  Antibiotic prophylaxis was ordered as indicated.  Catheter use: Patient will go home with foley if needed after post-operative voiding trial.  Post-operative instructions:  She was  provided with specific post-operative instructions, including precautions and signs/symptoms for which we would recommend contacting us , in addition to daytime and after-hours contact phone numbers. This was provided on a handout.   Post-operative medications: Prescriptions for motrin, tylenol , miralax, and oxycodone were sent to her pharmacy. Discussed using ibuprofen and tylenol  on a schedule to limit use of narcotics.   Laboratory testing:  We will check labs: As requested by anesthesia  Preoperative clearance:  She does require surgical clearance. Cardiac clearance received as acceptable risk.   Post-operative follow-up:  A post-operative appointment will be made for 6 weeks from the date of surgery. If she needs a post-operative nurse visit for a voiding trial, that will be set up after she leaves the hospital.    Patient will call the clinic or use MyChart should anything change or any new issues arise.   Kennedi Lizardo G Vi Biddinger, NP

## 2024-01-05 ENCOUNTER — Other Ambulatory Visit: Payer: Self-pay | Admitting: Physician Assistant

## 2024-01-05 DIAGNOSIS — L578 Other skin changes due to chronic exposure to nonionizing radiation: Secondary | ICD-10-CM

## 2024-01-06 ENCOUNTER — Encounter: Payer: Self-pay | Admitting: Obstetrics and Gynecology

## 2024-01-07 ENCOUNTER — Encounter (HOSPITAL_COMMUNITY): Payer: Self-pay | Admitting: Obstetrics and Gynecology

## 2024-01-07 NOTE — Progress Notes (Addendum)
 Cardiac clearance dated 08/24/23 in Epic from Dr Lenon. Stated pt was acceptable risk for surgery.    Spoke w/ via phone for pre-op interview--- [facility]----  BMP, EKG and CBG per anesthesia.       Lab results------Current A1C 4.8 in Epic dated 12/29/23 COVID test -----patient states asymptomatic no test needed Arrive at -------1100 NPO after MN NO Solid Food.  Clear liquids from MN until---1000 Pre-Surgery Ensure or G2:  Med rec completed Medications to take morning of surgery -----Bring Albuterol , Airsupra, Symbicort , Buspar , Bentyl , Neurontin , and Viibryd   Diabetic medication -----  GLP1 agonist last dose: Wegovy  2.4mg  last dose 01/04/24. GLP1 instructions: Hold any further doses until after surgery.  Patient instructed no nail polish to be worn day of surgery Patient instructed to bring photo id and insurance card day of surgery Patient aware to have Driver (ride ) / caregiver    for 24 hours after surgery - Husband Rolonda Pontarelli Patient Special Instructions ----- Pre-Op special Instructions -----  Patient verbalized understanding of instructions that were given at this phone interview. Patient denies chest pain, sob, fever, cough at the interview.

## 2024-01-11 NOTE — Anesthesia Preprocedure Evaluation (Signed)
 Anesthesia Evaluation  Patient identified by MRN, date of birth, ID band Patient awake    Reviewed: Allergy & Precautions, H&P , NPO status , Patient's Chart, lab work & pertinent test results  Airway Mallampati: I  TM Distance: >3 FB Neck ROM: Full    Dental no notable dental hx. (+) Teeth Intact, Dental Advisory Given, Caps   Pulmonary asthma , former smoker   Pulmonary exam normal breath sounds clear to auscultation       Cardiovascular Exercise Tolerance: Good hypertension, Pt. on medications and Pt. on home beta blockers +CHF  Normal cardiovascular exam Rhythm:Regular Rate:Normal  ECHO 6/25 EF = 40-45% mild hypokinesis with normal valves   Neuro/Psych  PSYCHIATRIC DISORDERS Anxiety Depression     Neuromuscular disease negative neurological ROS  negative psych ROS   GI/Hepatic negative GI ROS, Neg liver ROS,,,  Endo/Other  diabetes  Class 3 obesity  Renal/GU negative Renal ROS  negative genitourinary   Musculoskeletal negative musculoskeletal ROS (+) Arthritis ,  Fibromyalgia -  Abdominal   Peds negative pediatric ROS (+)  Hematology negative hematology ROS (+) Blood dyscrasia, anemia   Anesthesia Other Findings   Reproductive/Obstetrics negative OB ROS                              Anesthesia Physical Anesthesia Plan  ASA: 3  Anesthesia Plan: General   Post-op Pain Management: Tylenol  PO (pre-op)*, Celebrex PO (pre-op)*, Precedex and Dilaudid IV   Induction: Intravenous  PONV Risk Score and Plan: 3 and Ondansetron, Dexamethasone  and Treatment may vary due to age or medical condition  Airway Management Planned: Oral ETT and LMA  Additional Equipment: None  Intra-op Plan:   Post-operative Plan: Extubation in OR  Informed Consent: I have reviewed the patients History and Physical, chart, labs and discussed the procedure including the risks, benefits and alternatives for  the proposed anesthesia with the patient or authorized representative who has indicated his/her understanding and acceptance.       Plan Discussed with: Anesthesiologist and CRNA  Anesthesia Plan Comments: (  )         Anesthesia Quick Evaluation

## 2024-01-11 NOTE — Progress Notes (Signed)
 Patient had new start time for surgery scheduled for tomorrow 01-12-2024 @ 0830 by Dr Marilynne.  Called and spoke to pt .  Pt verbalized understanding to arrive at 0630 and be npo after midnight up until 0530 but can take medications with sips of water after 0530.

## 2024-01-12 ENCOUNTER — Ambulatory Visit (HOSPITAL_COMMUNITY): Admitting: Registered Nurse

## 2024-01-12 ENCOUNTER — Encounter (HOSPITAL_COMMUNITY)
Admission: RE | Disposition: A | Discharge status: Home / Self Care | Payer: Self-pay | Source: Home / Self Care | Attending: Obstetrics and Gynecology

## 2024-01-12 ENCOUNTER — Ambulatory Visit (HOSPITAL_COMMUNITY)
Admission: RE | Admit: 2024-01-12 | Discharge: 2024-01-12 | Disposition: A | Discharge status: Home / Self Care | Attending: Obstetrics and Gynecology | Admitting: Obstetrics and Gynecology

## 2024-01-12 ENCOUNTER — Telehealth: Payer: Self-pay | Admitting: Obstetrics and Gynecology

## 2024-01-12 ENCOUNTER — Encounter (HOSPITAL_COMMUNITY): Payer: Self-pay | Admitting: Obstetrics and Gynecology

## 2024-01-12 ENCOUNTER — Ambulatory Visit (HOSPITAL_BASED_OUTPATIENT_CLINIC_OR_DEPARTMENT_OTHER): Admitting: Registered Nurse

## 2024-01-12 DIAGNOSIS — F32A Depression, unspecified: Secondary | ICD-10-CM | POA: Diagnosis not present

## 2024-01-12 DIAGNOSIS — Z79899 Other long term (current) drug therapy: Secondary | ICD-10-CM | POA: Diagnosis not present

## 2024-01-12 DIAGNOSIS — M1990 Unspecified osteoarthritis, unspecified site: Secondary | ICD-10-CM | POA: Insufficient documentation

## 2024-01-12 DIAGNOSIS — Z87891 Personal history of nicotine dependence: Secondary | ICD-10-CM | POA: Diagnosis not present

## 2024-01-12 DIAGNOSIS — I110 Hypertensive heart disease with heart failure: Secondary | ICD-10-CM | POA: Diagnosis not present

## 2024-01-12 DIAGNOSIS — E119 Type 2 diabetes mellitus without complications: Secondary | ICD-10-CM | POA: Insufficient documentation

## 2024-01-12 DIAGNOSIS — M797 Fibromyalgia: Secondary | ICD-10-CM | POA: Insufficient documentation

## 2024-01-12 DIAGNOSIS — Z7985 Long-term (current) use of injectable non-insulin antidiabetic drugs: Secondary | ICD-10-CM | POA: Diagnosis not present

## 2024-01-12 DIAGNOSIS — Z6835 Body mass index (BMI) 35.0-35.9, adult: Secondary | ICD-10-CM | POA: Diagnosis not present

## 2024-01-12 DIAGNOSIS — I509 Heart failure, unspecified: Secondary | ICD-10-CM | POA: Insufficient documentation

## 2024-01-12 DIAGNOSIS — E66813 Obesity, class 3: Secondary | ICD-10-CM | POA: Insufficient documentation

## 2024-01-12 DIAGNOSIS — Z01818 Encounter for other preprocedural examination: Secondary | ICD-10-CM

## 2024-01-12 DIAGNOSIS — N814 Uterovaginal prolapse, unspecified: Secondary | ICD-10-CM | POA: Diagnosis not present

## 2024-01-12 DIAGNOSIS — J45909 Unspecified asthma, uncomplicated: Secondary | ICD-10-CM | POA: Insufficient documentation

## 2024-01-12 DIAGNOSIS — I10 Essential (primary) hypertension: Secondary | ICD-10-CM

## 2024-01-12 DIAGNOSIS — F419 Anxiety disorder, unspecified: Secondary | ICD-10-CM | POA: Diagnosis not present

## 2024-01-12 DIAGNOSIS — I5022 Chronic systolic (congestive) heart failure: Secondary | ICD-10-CM | POA: Diagnosis not present

## 2024-01-12 DIAGNOSIS — N812 Incomplete uterovaginal prolapse: Secondary | ICD-10-CM | POA: Diagnosis present

## 2024-01-12 HISTORY — PX: CYSTOSCOPY: SHX5120

## 2024-01-12 HISTORY — PX: PERINEOPLASTY: SHX2218

## 2024-01-12 HISTORY — PX: ANTERIOR (CYSTOCELE) AND POSTERIOR REPAIR (RECTOCELE) WITH XENFORM GRAFT AND SACROSPINOUS FIXATION: SHX6492

## 2024-01-12 HISTORY — DX: Essential (primary) hypertension: I10

## 2024-01-12 LAB — BASIC METABOLIC PANEL WITH GFR
Anion gap: 12 (ref 5–15)
BUN: 13 mg/dL (ref 8–23)
CO2: 24 mmol/L (ref 22–32)
Calcium: 9 mg/dL (ref 8.9–10.3)
Chloride: 104 mmol/L (ref 98–111)
Creatinine, Ser: 1.05 mg/dL — ABNORMAL HIGH (ref 0.44–1.00)
GFR, Estimated: 60 mL/min — ABNORMAL LOW (ref >60)
Glucose, Bld: 101 mg/dL — ABNORMAL HIGH (ref 70–99)
Potassium: 4 mmol/L (ref 3.5–5.1)
Sodium: 140 mmol/L (ref 135–145)

## 2024-01-12 LAB — GLUCOSE, CAPILLARY
Glucose-Capillary: 92 mg/dL (ref 70–99)
Glucose-Capillary: 103 mg/dL — ABNORMAL HIGH (ref 70–99)

## 2024-01-12 SURGERY — ANTERIOR (CYSTOCELE) AND POSTERIOR REPAIR (RECTOCELE) WITH XENFORM GRAFT AND SACROSPINOUS FIXATION
Anesthesia: General | Site: Vagina

## 2024-01-12 MED ORDER — LIDOCAINE 2% (20 MG/ML) 5 ML SYRINGE
INTRAMUSCULAR | Status: DC | PRN
  Administered 2024-01-12: 100 mg via INTRAVENOUS

## 2024-01-12 MED ORDER — CEFAZOLIN SODIUM-DEXTROSE 2-4 GM/100ML-% IV SOLN
INTRAVENOUS | Status: AC
  Filled 2024-01-12: qty 100

## 2024-01-12 MED ORDER — OXYCODONE HCL 5 MG PO TABS
ORAL_TABLET | ORAL | Status: AC
  Filled 2024-01-12: qty 1

## 2024-01-12 MED ORDER — PROPOFOL 10 MG/ML IV BOLUS
INTRAVENOUS | Status: DC | PRN
  Administered 2024-01-12 (×2): 100 mg via INTRAVENOUS

## 2024-01-12 MED ORDER — PHENAZOPYRIDINE HCL 100 MG PO TABS
ORAL_TABLET | ORAL | Status: AC
  Filled 2024-01-12: qty 2

## 2024-01-12 MED ORDER — DEXMEDETOMIDINE HCL IN NACL 80 MCG/20ML IV SOLN
INTRAVENOUS | Status: AC
  Filled 2024-01-12: qty 20

## 2024-01-12 MED ORDER — GABAPENTIN 300 MG PO CAPS
ORAL_CAPSULE | ORAL | Status: AC
  Filled 2024-01-12: qty 1

## 2024-01-12 MED ORDER — FENTANYL CITRATE (PF) 250 MCG/5ML IJ SOLN
INTRAMUSCULAR | Status: DC | PRN
  Administered 2024-01-12 (×2): 25 ug via INTRAVENOUS

## 2024-01-12 MED ORDER — ACETAMINOPHEN 500 MG PO TABS
1000.0000 mg | ORAL_TABLET | ORAL | Status: AC
  Administered 2024-01-12: 1000 mg via ORAL

## 2024-01-12 MED ORDER — ACETAMINOPHEN 500 MG PO TABS
ORAL_TABLET | ORAL | Status: AC
  Filled 2024-01-12: qty 2

## 2024-01-12 MED ORDER — SODIUM CHLORIDE 0.9 % IV SOLN: INTRAVENOUS | Status: DC | PRN

## 2024-01-12 MED ORDER — CHLORHEXIDINE GLUCONATE 0.12 % MT SOLN
OROMUCOSAL | Status: AC
  Filled 2024-01-12: qty 15

## 2024-01-12 MED ORDER — LIDOCAINE 2% (20 MG/ML) 5 ML SYRINGE
INTRAMUSCULAR | Status: AC
  Filled 2024-01-12: qty 5

## 2024-01-12 MED ORDER — LIDOCAINE-EPINEPHRINE 1 %-1:100000 IJ SOLN
INTRAMUSCULAR | Status: DC | PRN
  Administered 2024-01-12: 20 mL

## 2024-01-12 MED ORDER — MEPERIDINE HCL 25 MG/ML IJ SOLN: 6.2500 mg | INTRAMUSCULAR | Status: DC | PRN

## 2024-01-12 MED ORDER — ORAL CARE MOUTH RINSE: 15.0000 mL | Freq: Once | OROMUCOSAL | Status: AC

## 2024-01-12 MED ORDER — HEPARIN SODIUM (PORCINE) 5000 UNIT/ML IJ SOLN
5000.0000 [IU] | INTRAMUSCULAR | Status: AC
  Administered 2024-01-12: 5000 [IU] via SUBCUTANEOUS

## 2024-01-12 MED ORDER — DEXAMETHASONE SOD PHOSPHATE PF 10 MG/ML IJ SOLN
INTRAMUSCULAR | Status: DC | PRN
  Administered 2024-01-12: 10 mg via INTRAVENOUS

## 2024-01-12 MED ORDER — ONDANSETRON HCL 4 MG/2ML IJ SOLN
INTRAMUSCULAR | Status: AC
  Filled 2024-01-12: qty 2

## 2024-01-12 MED ORDER — OXYCODONE HCL 5 MG/5ML PO SOLN: 5.0000 mg | Freq: Once | ORAL | Status: AC | PRN

## 2024-01-12 MED ORDER — SODIUM CHLORIDE (PF) 0.9 % IJ SOLN
INTRAMUSCULAR | Status: AC
  Filled 2024-01-12: qty 10

## 2024-01-12 MED ORDER — PROPOFOL 10 MG/ML IV BOLUS
INTRAVENOUS | Status: AC
  Filled 2024-01-12: qty 20

## 2024-01-12 MED ORDER — 0.9 % SODIUM CHLORIDE (POUR BTL) OPTIME
TOPICAL | Status: DC | PRN
  Administered 2024-01-12: 1000 mL

## 2024-01-12 MED ORDER — GABAPENTIN 300 MG PO CAPS: 300.0000 mg | ORAL_CAPSULE | ORAL | Status: DC

## 2024-01-12 MED ORDER — SODIUM CHLORIDE 0.9 % IR SOLN
Status: DC | PRN
Start: 2024-01-12 — End: 2024-01-12
  Administered 2024-01-12: 200 mL via INTRAVESICAL

## 2024-01-12 MED ORDER — OXYCODONE HCL 5 MG PO TABS
5.0000 mg | ORAL_TABLET | Freq: Once | ORAL | Status: AC | PRN
  Administered 2024-01-12: 5 mg via ORAL

## 2024-01-12 MED ORDER — PHENAZOPYRIDINE HCL 100 MG PO TABS
200.0000 mg | ORAL_TABLET | ORAL | Status: AC
  Administered 2024-01-12: 200 mg via ORAL

## 2024-01-12 MED ORDER — HEPARIN SODIUM (PORCINE) 5000 UNIT/ML IJ SOLN
INTRAMUSCULAR | Status: AC
  Filled 2024-01-12: qty 1

## 2024-01-12 MED ORDER — EPHEDRINE SULFATE-NACL 50-0.9 MG/10ML-% IV SOSY
PREFILLED_SYRINGE | INTRAVENOUS | Status: DC | PRN
  Administered 2024-01-12 (×3): 5 mg via INTRAVENOUS

## 2024-01-12 MED ORDER — FENTANYL CITRATE (PF) 100 MCG/2ML IJ SOLN: 25.0000 ug | INTRAMUSCULAR | Status: DC | PRN

## 2024-01-12 MED ORDER — DEXMEDETOMIDINE HCL IN NACL 80 MCG/20ML IV SOLN
INTRAVENOUS | Status: DC | PRN
  Administered 2024-01-12 (×2): 4 ug via INTRAVENOUS

## 2024-01-12 MED ORDER — LIDOCAINE-EPINEPHRINE 1 %-1:100000 IJ SOLN
INTRAMUSCULAR | Status: AC
Start: 2024-01-12 — End: 2024-01-12
  Filled 2024-01-12: qty 2

## 2024-01-12 MED ORDER — ROCURONIUM BROMIDE 10 MG/ML (PF) SYRINGE
PREFILLED_SYRINGE | INTRAVENOUS | Status: AC
  Filled 2024-01-12: qty 10

## 2024-01-12 MED ORDER — MIDAZOLAM HCL (PF) 2 MG/2ML IJ SOLN
INTRAMUSCULAR | Status: DC | PRN
  Administered 2024-01-12: 2 mg via INTRAVENOUS

## 2024-01-12 MED ORDER — LACTATED RINGERS IV SOLN
INTRAVENOUS | Status: DC
Start: 2024-01-12 — End: 2024-01-12

## 2024-01-12 MED ORDER — MIDAZOLAM HCL 2 MG/2ML IJ SOLN
INTRAMUSCULAR | Status: AC
  Filled 2024-01-12: qty 2

## 2024-01-12 MED ORDER — ONDANSETRON HCL 4 MG/2ML IJ SOLN: 4.0000 mg | Freq: Once | INTRAMUSCULAR | Status: DC | PRN

## 2024-01-12 MED ORDER — PHENYLEPHRINE 80 MCG/ML (10ML) SYRINGE FOR IV PUSH (FOR BLOOD PRESSURE SUPPORT)
PREFILLED_SYRINGE | INTRAVENOUS | Status: DC | PRN
Start: 2024-01-12 — End: 2024-01-12
  Administered 2024-01-12 (×7): 80 ug via INTRAVENOUS
  Administered 2024-01-12: 160 ug via INTRAVENOUS

## 2024-01-12 MED ORDER — EPHEDRINE 5 MG/ML INJ
INTRAVENOUS | Status: AC
  Filled 2024-01-12: qty 5

## 2024-01-12 MED ORDER — CHLORHEXIDINE GLUCONATE 0.12 % MT SOLN
15.0000 mL | Freq: Once | OROMUCOSAL | Status: AC
  Administered 2024-01-12: 15 mL via OROMUCOSAL

## 2024-01-12 MED ORDER — CEFAZOLIN SODIUM-DEXTROSE 2-4 GM/100ML-% IV SOLN
2.0000 g | INTRAVENOUS | Status: AC
  Administered 2024-01-12: 2 g via INTRAVENOUS

## 2024-01-12 MED ORDER — ONDANSETRON HCL 4 MG/2ML IJ SOLN
INTRAMUSCULAR | Status: DC | PRN
  Administered 2024-01-12: 4 mg via INTRAVENOUS

## 2024-01-12 MED ORDER — FENTANYL CITRATE (PF) 100 MCG/2ML IJ SOLN
INTRAMUSCULAR | Status: AC
  Filled 2024-01-12: qty 2

## 2024-01-12 SURGICAL SUPPLY — 36 items
BLADE SURG 15 STRL LF DISP TIS (BLADE) ×5 IMPLANT
COVER MAYO STAND STRL (DRAPES) ×5 IMPLANT
DEVICE CAPIO SLIM SINGLE (INSTRUMENTS) ×5 IMPLANT
GAUZE 4X4 16PLY ~~LOC~~+RFID DBL (SPONGE) IMPLANT
GLOVE BIOGEL PI IND STRL 6.5 (GLOVE) ×5 IMPLANT
GLOVE BIOGEL PI IND STRL 7.0 (GLOVE) ×10 IMPLANT
GLOVE ECLIPSE 6.0 STRL STRAW (GLOVE) ×5 IMPLANT
GOWN STRL REUS W/ TWL LRG LVL3 (GOWN DISPOSABLE) ×5 IMPLANT
HIBICLENS CHG 4% 4OZ (MISCELLANEOUS) ×5 IMPLANT
HIBICLENS CHG 4% 4OZ BTL (MISCELLANEOUS) ×5 IMPLANT
HOLDER FOLEY CATH W/STRAP (MISCELLANEOUS) ×5 IMPLANT
KIT TURNOVER KIT B (KITS) ×5 IMPLANT
NDL HYPO 22X1.5 SAFETY MO (MISCELLANEOUS) ×4 IMPLANT
NDL MAYO 6 CRC TAPER PT (NEEDLE) ×4 IMPLANT
NEEDLE HYPO 22X1.5 SAFETY MO (MISCELLANEOUS) ×4 IMPLANT
NEEDLE MAYO 6 CRC TAPER PT (NEEDLE) ×8 IMPLANT
PACK VAGINAL WOMENS (CUSTOM PROCEDURE TRAY) ×5 IMPLANT
PAD OB MATERNITY 11 LF (PERSONAL CARE ITEMS) ×5 IMPLANT
RETRACTOR LONE STAR DISPOSABLE (INSTRUMENTS) ×5 IMPLANT
RETRACTOR STAY HOOK 5MM (MISCELLANEOUS) ×5 IMPLANT
SET CYSTO IRRIGATION (SET/KITS/TRAYS/PACK) ×5 IMPLANT
SET IRRIG Y TYPE TUR BLADDER L (SET/KITS/TRAYS/PACK) ×5 IMPLANT
SLEEVE SCD COMPRESS KNEE MED (STOCKING) ×5 IMPLANT
SOLN 0.9% NACL POUR BTL 1000ML (IV SOLUTION) ×5 IMPLANT
SPIKE FLUID TRANSFER (MISCELLANEOUS) IMPLANT
SUCTION TUBE FRAZIER 10FR DISP (SUCTIONS) ×5 IMPLANT
SURGIFLO W/THROMBIN 8M KIT (HEMOSTASIS) IMPLANT
SUT ABS MONO DBL WITH NDL 48IN (SUTURE) ×10 IMPLANT
SUT CAPIO PGA 48IN SZ 0 (SUTURE) ×2 IMPLANT
SUT PDS PLUS AB 0 CT-2 (SUTURE) ×4 IMPLANT
SUT VIC AB 0 CT1 27XBRD ANBCTR (SUTURE) ×5 IMPLANT
SUT VIC AB 2-0 SH 27XBRD (SUTURE) ×5 IMPLANT
SUT VICRYL 2-0 SH 8X27 (SUTURE) ×5 IMPLANT
SYR BULB EAR ULCER 3OZ GRN STR (SYRINGE) ×5 IMPLANT
TOWEL GREEN STERILE (TOWEL DISPOSABLE) ×5 IMPLANT
TRAY FOLEY W/BAG SLVR 14FR LF (SET/KITS/TRAYS/PACK) ×5 IMPLANT

## 2024-01-12 NOTE — Telephone Encounter (Signed)
 Bridget Johnston underwent Anterior and posterior repair with perineorrhaphy, sacrospinous hysteropexy, cystoscopy  on 01/12/24.   She passed her voiding trial.  was backfilled into the bladder Voided  PVR by bladder scan was 0ml.   She was discharged without a catheter. Please call her for a routine post op check. Thanks!  [name], MD

## 2024-01-12 NOTE — Transfer of Care (Signed)
 Immediate Anesthesia Transfer of Care Note  Patient: Bridget Johnston  Procedure(s) Performed: ANTERIOR (CYSTOCELE) AND POSTERIOR REPAIR (RECTOCELE)  AND SACROSPINOUS FIXATION (Vagina ) PERINEOPLASTY (Perineum) CYSTOSCOPY (Bladder)  Patient Location: PACU  Anesthesia Type:General  Level of Consciousness: drowsy  Airway & Oxygen Therapy: Patient connected to face mask oxygen  Post-op Assessment: Report given to RN and Post -op Vital signs reviewed and stable  Post vital signs: Reviewed and stable  Last Vitals:  Vitals Value Taken Time  BP 108/71 01/12/24 10:05  Temp    Pulse 86 01/12/24 10:08  Resp 18 01/12/24 10:08  SpO2 100 % 01/12/24 10:08  Vitals shown include unfiled device data.  Last Pain:  Vitals:   01/12/24 0647  TempSrc: Oral  PainSc: 0-No pain      Patients Stated Pain Goal: 5 (01/12/24 9352)  Complications: There were no known notable events for this encounter.

## 2024-01-12 NOTE — Anesthesia Procedure Notes (Signed)
 Procedure Name: LMA Insertion Date/Time: 01/12/2024 8:39 AM  Performed by: Christopher Comings, CRNAPre-anesthesia Checklist: Patient identified, Emergency Drugs available, Suction available and Patient being monitored Patient Re-evaluated:Patient Re-evaluated prior to induction Oxygen Delivery Method: Circle system utilized Preoxygenation: Pre-oxygenation with 100% oxygen Induction Type: IV induction Ventilation: Mask ventilation without difficulty LMA: LMA inserted LMA Size: 4.0 Number of attempts: 1 Placement Confirmation: positive ETCO2 and breath sounds checked- equal and bilateral Tube secured with: Tape Dental Injury: Teeth and Oropharynx as per pre-operative assessment

## 2024-01-12 NOTE — Anesthesia Postprocedure Evaluation (Signed)
 Anesthesia Post Note  Patient: Bridget Johnston  Procedure(s) Performed: ANTERIOR (CYSTOCELE) AND POSTERIOR REPAIR (RECTOCELE)  AND SACROSPINOUS FIXATION (Vagina ) PERINEOPLASTY (Perineum) CYSTOSCOPY (Bladder)     Patient location during evaluation: PACU Anesthesia Type: General Level of consciousness: awake and alert Pain management: pain level controlled Vital Signs Assessment: post-procedure vital signs reviewed and stable Respiratory status: spontaneous breathing, nonlabored ventilation, respiratory function stable and patient connected to nasal cannula oxygen Cardiovascular status: blood pressure returned to baseline and stable Postop Assessment: no apparent nausea or vomiting Anesthetic complications: no   There were no known notable events for this encounter.  Last Vitals:  Vitals:   01/12/24 1129 01/12/24 1130  BP: (P) 120/77 120/77  Pulse:  80  Resp: (P) 16   Temp: (P) 36.6 C   SpO2:  99%    Last Pain:  Vitals:   01/12/24 1130  TempSrc:   PainSc: 4                  Kyrell Ruacho

## 2024-01-12 NOTE — Op Note (Signed)
 Operative Note  Preoperative Diagnosis: anterior vaginal prolapse, posterior vaginal prolapse, and uterovaginal prolapse, incomplete  Postoperative Diagnosis: same  Procedures performed:  Anterior and posterior repair with perineorrhaphy, sacrospinous hysteropexy, cystoscopy  Implants: none  Attending Surgeon: Rosaline Caper, MD  Assistant: Jorene Moats, PA  Anesthesia: General LMA  Findings: 1. On vaginal exam, stage II prolapse present  2. On cystoscopy, normal bladder and urethral mucosa without injury or lesion. Brisk bilateral ureteral efflux present.    Specimens: none  Estimated blood loss: 25 mL  IV fluids: 850 mL  Urine output: 50 mL  Complications: none  Procedure in Detail:  After informed consent was obtained, the patient was taken to the operating room where anesthesia was induced and found to be adequate. She was placed in dorsal lithotomy position, taking care to avoid any traction on the extremities, and then prepped and draped in the usual sterile fashion. A self-retaining lonestar retractor was placed using four elastic blue stays.  After a foley catheter was inserted into the urethra, the location of the midurethra was palpated. Two Allis clamps were along the anterior vaginal wall defect. 1% lidocaine  with epinephrine  was injected into the vaginal mucosa.  A vertical incision was made between these two Allis clamps with a 15 blade scalpel.  Allis clamps were placed along this incision and Metzenbaum scissors were used to undermine the vaginal mucosa along the incision.  The vaginal mucosa was then sharply dissected off to the vesicovaginal septum bilaterally to the level of the pubic rami.    For the sacrospinous ligament fixation (SSLF), the ischial spine was accessed on the right side via dissection with Metzenbaum scissors and blunt dissection.  The sacrospinous ligament was palpated. Two 0 PDS suture was then placed at the sacrospinous ligament two  fingerbreadths medial to the ischial spine, in order to avoid the pudendal neurovascular bundle, using a Capio needle driver.  The PDS suture was attached to the vaginal epithelium on the ipsilateral side of the vaginal apex and held. Anterior plication of the vesicovaginal septum was then performed using mattress sutures of 2-0 Vicryl. The vaginal mucosal edges were trimmed and the incision reapproximated with 2-0 Vicryl in a running fashion. The SSLF suture was then tied down with excellent support of the anterior and apical vagina.  The Foley catheter was removed.  A 70-degree cystoscope was introduced, and 360-degree inspection revealed no trauma to the bladder, with bilateral ureteral efflux.  The bladder was drained and the cystoscope was removed.  The Foley catheter was reinserted.   Attention was then turned to the posterior vagina.  Two Allis clamps were in the midline of the posterior vaginal wall defect.  1% lidocaine  with epinephrine  was injected into the vaginal mucosa. A vertical incision was made between these clamps with a 15 blade scalpel and a diamond shape was made over the perineum. The excess perineal skin was removed.  The rectovaginal septum was then dissected off the vaginal mucosa bilaterally.  The rectovaginal septum was then plicated with a pursestring and mattress sutures of 2-0 Vicryl.  After placement of the first plication stitch two fingers were inserted into the vaginal to confirm adequate caliber.  The last distal stitch incorporated the perineal body in a U stitch fashion.   After plication, the excess vaginal mucosa was trimmed and the vaginal mucosa was reapproximated using 2-0 Vicryl sutures in a running fashion. The perineal body was reapproximated with a 0-vicryl. The perineal skin was then closed with a subcutaneous and  running 2-0 vicryl suture. The vagina was copiously irrigated.  Hemostasis was noted.  Vaginal packing was not placed.  A rectal examination was normal  and confirmed no sutures within the rectum.  The patient tolerated the procedure well.  She was awakened from anesthesia and transferred to the recovery room in stable condition. All counts were correct x 2.    [name], MD

## 2024-01-12 NOTE — Discharge Instructions (Addendum)

## 2024-01-12 NOTE — Interval H&P Note (Signed)
 History and Physical Interval Note:  01/12/2024 8:12 AM  Bridget Johnston  has presented today for surgery, with the diagnosis of uterovag prolpapse incomplete.  The various methods of treatment have been discussed with the patient and family. After consideration of risks, benefits and other options for treatment, the patient has consented to  Procedure(s): ANTERIOR (CYSTOCELE) AND POSTERIOR REPAIR (RECTOCELE) AND SACROSPINOUS FIXATION (N/A) PERINEOPLASTY (N/A) as a surgical intervention.  The patient's history has been reviewed, patient examined, no change in status, stable for surgery.  I have reviewed the patient's chart and labs.  Questions were answered to the patient's satisfaction.     [name]

## 2024-01-13 ENCOUNTER — Encounter (HOSPITAL_COMMUNITY): Payer: Self-pay | Admitting: Obstetrics and Gynecology

## 2024-01-13 NOTE — Telephone Encounter (Signed)
 Bridget Johnston  underwent Anterior (cystocele) And Posterior Repair (rectocele)  And Sacrospinous Fixation, Perineoplasty, and Cystoscopy  on 01/12/2024  with [] Dr Marilynne [] Dr Guadlupe.  The patient reports that her pain is not controlled.  She is taking [] No Medication [x] Acetaminophen  500mg  every 6 hours [x] Ibuprofen 600mg  every 6 hours or [x]  Prescribed Narcotic.  Her pain level is 8[x] with medication [] Without medication is.   She reports vaginal bleeding.  The patient is tolerating PO fluids and solids. She has not had a bowel movement and is taking Miralax for a bowel regimen. She is not passing gas.  She was discharged without a catheter.   []  Discharged without a catheter, the patient does feel as if she is emptying her bladder.  [] Discharged with a catheter, the patient is not having any concerns with her catheter.  She will return for a voiding trial. [] Verified scheduled date and time with patient.  She does not having any additional questions.  Reviewed Post operative instructions as needed to answer additional questions.   CC'd note to patient's provider.

## 2024-01-18 ENCOUNTER — Other Ambulatory Visit: Payer: Self-pay | Admitting: Physician Assistant

## 2024-01-18 DIAGNOSIS — J4530 Mild persistent asthma, uncomplicated: Secondary | ICD-10-CM

## 2024-01-25 ENCOUNTER — Encounter: Payer: Self-pay | Admitting: Medical-Surgical

## 2024-01-25 ENCOUNTER — Ambulatory Visit: Admitting: Medical-Surgical

## 2024-01-25 ENCOUNTER — Telehealth: Admitting: Medical-Surgical

## 2024-01-25 ENCOUNTER — Ambulatory Visit: Payer: Self-pay

## 2024-01-25 DIAGNOSIS — J019 Acute sinusitis, unspecified: Secondary | ICD-10-CM

## 2024-01-25 DIAGNOSIS — B9689 Other specified bacterial agents as the cause of diseases classified elsewhere: Secondary | ICD-10-CM | POA: Diagnosis not present

## 2024-01-25 MED ORDER — FLUCONAZOLE 150 MG PO TABS: 150.0000 mg | ORAL_TABLET | Freq: Once | ORAL | 0 refills | Status: AC

## 2024-01-25 MED ORDER — CEFDINIR 300 MG PO CAPS: 300.0000 mg | ORAL_CAPSULE | Freq: Two times a day (BID) | ORAL | 0 refills | Status: AC

## 2024-01-25 MED ORDER — METHYLPREDNISOLONE 4 MG PO TBPK: ORAL_TABLET | ORAL | 0 refills | Status: AC

## 2024-01-25 MED ORDER — PROMETHAZINE-DM 6.25-15 MG/5ML PO SYRP: 5.0000 mL | ORAL_SOLUTION | Freq: Four times a day (QID) | ORAL | 0 refills | Status: AC | PRN

## 2024-01-25 NOTE — Telephone Encounter (Signed)
 FYI Only or Action Required?: FYI only for provider: appointment scheduled on 11/18.  Patient was last seen in primary care on 12/29/2023 by Antoniette Vermell CROME, PA-C.  Called Nurse Triage reporting Cough.  Symptoms began a week ago.  Interventions attempted: Prescription medications: inhaler and Rest, hydration, or home remedies.  Symptoms are: gradually worsening.  Triage Disposition: See Physician Within 24 Hours  Patient/caregiver understands and will follow disposition?: Yes  Copied from CRM #[id number]. Topic: Clinical - Red Word Triage >> Jan 25, 2024 10:43 AM Berwyn MATSU wrote: Red Word that prompted transfer to Nurse Triage: Upper respiratory symptoms, cough, unable to sleep due to cough can't hold urine either. Reason for Disposition  [1] Continuous (nonstop) coughing interferes with work or school AND [2] no improvement using cough treatment per Care Advice  Answer Assessment - Initial Assessment Questions Patient with semi productive cough that started a few days after her Rectocele cystocele surgery on 11/4- bladder isnt strong enough yet so having stress incontinence episodes.  Gets pneumonia easily and only able to walk around the house post op.  Sinus and chest congestion, semi productive cough, sometimes hacky cough. Denies fever, SOB or wheezing. Using inhaler some and does help.  Telehealth appt with PCP office today. UC/ED precautions advised 1. ONSET: When did the cough begin?      3-4 days ago  2. SEVERITY: How bad is the cough today?      Worse at night 3. SPUTUM: Describe the color of your sputum (e.g., none, dry cough; clear, white, yellow, green)     Green- yellow thick 4. HEMOPTYSIS: Are you coughing up any blood? If Yes, ask: How much? (e.g., flecks, streaks, tablespoons, etc.)     denies 5. DIFFICULTY BREATHING: Are you having difficulty breathing? If Yes, ask: How bad is it? (e.g., mild, moderate, severe)      Denies SOB- just having to catch  her breath with coughing fits  6. FEVER: Do you have a fever? If Yes, ask: What is your temperature, how was it measured, and when did it start?     denies 7. CARDIAC HISTORY: Do you have any history of heart disease? (e.g., heart attack, congestive heart failure)      HTN  8. LUNG HISTORY: Do you have any history of lung disease?  (e.g., pulmonary embolus, asthma, emphysema)     Asthma- inhaler 9. PE RISK FACTORS: Do you have a history of blood clots? (or: recent major surgery, recent prolonged travel, bedridden)     Denies hx- but recent surgery  10. OTHER SYMPTOMS: Do you have any other symptoms? (e.g., runny nose, wheezing, chest pain)       Sinus congestion, chest congestion, cough - denies sore throat  Protocols used: Cough - Acute Productive-A-AH

## 2024-01-25 NOTE — Telephone Encounter (Signed)
 Patient chart shows scheduled for video visit today with Joy Jessup, NP

## 2024-01-25 NOTE — Progress Notes (Signed)
        Established patient visit   History of Present Illness   I connected with Bridget Johnston on 01/27/24 at  2:00 PM EST by a video enabled telemedicine application and verified that I am speaking with the correct person using two identifiers.   I discussed the limitations of evaluation and management by telemedicine and the availability of in person appointments. The patient expressed understanding and agreed to proceed.  Provider location: Office Patient location: Home  Discussed the use of AI scribe software for clinical note transcription with the patient, who gave verbal consent to proceed.  History of Present Illness   Bridget Johnston is a 63 year old female who presents with postoperative complications following cystocele and rectocele surgery.  Cough and upper respiratory symptoms - Onset of cold symptoms four to five days ago - Nighttime cough with postnasal drip - Cough produces green, thick mucus and has caused difficulty with urinary incontinence - Has sinus congestion with post-nasal drip along with facial pain/pressure - No fever or chills - Home tests for influenza and COVID-19 negative - Over-the-counter treatments ineffective  Asthma and medication tolerance - History of asthma - Unable to take prednisone  - Tolerates methylprednisolone   Concerns regarding antibiotic use - Concern about developing yeast infection with antibiotic use     Assessment & Plan   Acute bacterial sinusitis Acute symptoms with nighttime productive cough and postnasal drip. Home tests negative for flu and COVID-19. Antibiotics indicated due to recent surgery leading to immunocompromised status and asthma. Discussed yeast infection risk with antibiotics and planned Diflucan  use. - Prescribed Omnicef (cefdinir) 300 mg twice daily for 7 days. - Adding methylprednisolone  6 day taper.  - Prescribed Diflucan  (fluconazole ) to prevent yeast infection. - Prescribed promethazine  DM for  nighttime cough relief.  Follow up   Return if symptoms worsen or fail to improve. __________________________________ Bridget FREDRIK Palin, DNP, APRN, FNP-BC Primary Care and Sports Medicine [facility]

## 2024-01-26 ENCOUNTER — Telehealth: Payer: Self-pay

## 2024-01-26 NOTE — Telephone Encounter (Signed)
 Copied from CRM #[id number]. Topic: Clinical - Prescription Issue >> Jan 25, 2024  4:39 PM Joesph NOVAK wrote: Reason for CRM: patient had a appt today and she is wondering when her provider is going to send in her prescription? She said antibiotics and z pak

## 2024-01-26 NOTE — Telephone Encounter (Signed)
 Medication sent in yesterday according to patients med list.

## 2024-01-28 ENCOUNTER — Encounter: Payer: Self-pay | Admitting: Physician Assistant

## 2024-02-22 ENCOUNTER — Encounter: Payer: Self-pay | Admitting: Obstetrics and Gynecology

## 2024-02-22 ENCOUNTER — Ambulatory Visit: Admitting: Obstetrics and Gynecology

## 2024-02-22 VITALS — BP 104/69 | HR 80

## 2024-02-22 DIAGNOSIS — R350 Frequency of micturition: Secondary | ICD-10-CM

## 2024-02-22 LAB — POCT URINE DIPSTICK
Blood, UA: NEGATIVE
Glucose, UA: NEGATIVE mg/dL
Nitrite, UA: NEGATIVE
POC PROTEIN,UA: 100 — AB
Spec Grav, UA: 1.015 (ref 1.010–1.025)
Urobilinogen, UA: 0.2 U/dL
pH, UA: 6 (ref 5.0–8.0)

## 2024-02-22 NOTE — Progress Notes (Signed)
 [facility]  Date of Visit: 02/22/2024  History of Present Illness: Ms. Austria is a 63 y.o. female scheduled today for a post-operative visit.   Surgery: s/p Anterior and posterior repair with perineorrhaphy, sacrospinous hysteropexy, cystoscopy on 01/12/24  She passed her postoperative void trial.   Postoperative course has been uncomplicated.   Today she reports she is feeling well. Has a little vaginal discharge but feels it may be normal. She is have an URI after the surgery and she had antibiotics and needed to take diflucan  after.   UTI in the last 6 weeks? No  Pain? No  She has returned to her normal activity (except for postop restrictions) Vaginal bulge? No  Stress incontinence: No  Urgency/frequency: No  Urge incontinence: No  Voiding dysfunction: No  Bowel issues: No - still taking the miralax  most days, occasionally takes sensokot  Subjective Success: Do you usually have a bulge or something falling out that you can see or feel in the vaginal area? No  Retreatment Success: Any retreatment with surgery or pessary for any compartment? No   Medications: She has a current medication list which includes the following prescription(s): airsupra , AMBULATORY NON FORMULARY MEDICATION, atorvastatin , one touch ultra mini, budesonide -formoterol , buspirone , cefdinir , dicyclomine , estradiol , furosemide , gabapentin , ibuprofen , losartan, methylprednisolone , metoprolol  succinate, omega-3 acid ethyl esters, onetouch verio, promethazine -dextromethorphan, tretinoin , triamcinolone  cream, vilazodone  hcl, vitamin d  (ergocalciferol ), wegovy , and zonisamide .   Allergies: Patient is allergic to prednisolone, measles and rubella virus vaccine, other, jardiance [empagliflozin], adhesive [tape], and prednisone .   Physical Exam: BP 104/69   Pulse 80   Abdomen: soft, non-tender, without masses or organomegaly Pelvic Examination: Perineum healing well but sutures present. Vagina:  Incisions healing well. Sutures are present at incision line and there is not granulation tissue. No tenderness along the anterior or posterior vagina. No apical tenderness. No pelvic masses.   POP-Q: POP-Q  -2.5                                            Aa   -2.5                                           Ba  -8                                              C   2.5                                            Gh  6                                            Pb  9  tvl   -3                                            Ap  -3                                            Bp  -8                                              D    ---------------------------------------------------------  Assessment and Plan:  1. Urinary frequency     - Can resume regular activity including exercise. Discussed avoidance of heavy lifting and straining long term to reduce the risk of recurrence. Wait for intercourse until after next visit.  - continue vaginal estrogen cream twice a week  Return 1 month  [name], MD

## 2024-02-28 ENCOUNTER — Other Ambulatory Visit: Payer: Self-pay | Admitting: Physician Assistant

## 2024-02-28 DIAGNOSIS — R4701 Aphasia: Secondary | ICD-10-CM

## 2024-02-28 DIAGNOSIS — R404 Transient alteration of awareness: Secondary | ICD-10-CM

## 2024-02-29 ENCOUNTER — Encounter: Payer: Self-pay | Admitting: Physician Assistant

## 2024-03-03 ENCOUNTER — Encounter: Payer: Self-pay | Admitting: Physician Assistant

## 2024-03-03 DIAGNOSIS — F32A Depression, unspecified: Secondary | ICD-10-CM

## 2024-03-05 ENCOUNTER — Other Ambulatory Visit: Payer: Self-pay | Admitting: Physician Assistant

## 2024-03-05 DIAGNOSIS — L578 Other skin changes due to chronic exposure to nonionizing radiation: Secondary | ICD-10-CM

## 2024-03-07 ENCOUNTER — Other Ambulatory Visit: Payer: Self-pay | Admitting: Physician Assistant

## 2024-03-07 ENCOUNTER — Encounter: Payer: Self-pay | Admitting: Physician Assistant

## 2024-03-07 DIAGNOSIS — I10 Essential (primary) hypertension: Secondary | ICD-10-CM

## 2024-03-07 DIAGNOSIS — E782 Mixed hyperlipidemia: Secondary | ICD-10-CM

## 2024-03-07 DIAGNOSIS — E8881 Metabolic syndrome: Secondary | ICD-10-CM

## 2024-03-07 DIAGNOSIS — R7303 Prediabetes: Secondary | ICD-10-CM

## 2024-03-07 DIAGNOSIS — I5022 Chronic systolic (congestive) heart failure: Secondary | ICD-10-CM

## 2024-03-07 DIAGNOSIS — E66812 Obesity, class 2: Secondary | ICD-10-CM

## 2024-03-13 ENCOUNTER — Encounter: Payer: Self-pay | Admitting: Obstetrics and Gynecology

## 2024-03-13 ENCOUNTER — Telehealth: Payer: Self-pay

## 2024-03-13 NOTE — Telephone Encounter (Signed)
 Patients states she has had some BRB from the vaginal vault, only one time yesterday, light blood today when wiping, denies soaking a pad, no heavy bleeding. Took some stool softener,  and had some very watery stool. Wonders if this could have irritated the area that still has a stiches in place. Denies any pain. No fever.

## 2024-03-14 NOTE — Telephone Encounter (Signed)
 Patient was contacted this morning and currently has not had any bleeding today. Will contact us  if symptoms return or worsen.

## 2024-03-20 ENCOUNTER — Encounter: Payer: Self-pay | Admitting: *Deleted

## 2024-03-22 ENCOUNTER — Ambulatory Visit (INDEPENDENT_AMBULATORY_CARE_PROVIDER_SITE_OTHER): Admitting: Obstetrics and Gynecology

## 2024-03-22 ENCOUNTER — Encounter: Payer: Self-pay | Admitting: Obstetrics and Gynecology

## 2024-03-22 VITALS — BP 101/62 | HR 103

## 2024-03-22 DIAGNOSIS — Z9889 Other specified postprocedural states: Secondary | ICD-10-CM

## 2024-03-22 DIAGNOSIS — Z48816 Encounter for surgical aftercare following surgery on the genitourinary system: Secondary | ICD-10-CM

## 2024-03-22 NOTE — Progress Notes (Signed)
 Bridget Johnston  Date of Visit: 03/22/2024  History of Present Illness: Ms. Bourgoin is a 64 y.o. female scheduled today for a post-operative visit.   Surgery: s/p Anterior and posterior repair with perineorrhaphy, sacrospinous hysteropexy, cystoscopy on 01/12/24  She passed her postoperative void trial.   Postoperative course has been uncomplicated.   Noticed some vaginal bleeding last week. It was a small amount then resolved. Has not had any since. She is using the vaginal estrogen cream twice a week.   Medications: She has a current medication list which includes the following prescription(s): airsupra , AMBULATORY NON FORMULARY MEDICATION, atorvastatin , one touch ultra mini, budesonide -formoterol , buspirone , cefdinir , dicyclomine , estradiol , furosemide , gabapentin , ibuprofen , losartan, methylprednisolone , metoprolol  succinate, omega-3 acid ethyl esters, onetouch verio, promethazine -dextromethorphan, tretinoin , triamcinolone  cream, vilazodone  hcl, vitamin d  (ergocalciferol ), wegovy , and zonisamide .   Allergies: Patient is allergic to prednisolone, measles and rubella virus vaccine, other, jardiance [empagliflozin], adhesive [tape], and prednisone .   Physical Exam: BP 101/62   Pulse (!) 103   Abdomen: soft, non-tender, without masses or organomegaly Pelvic Examination: Perineum well healed. Vagina: Incisions healing well. Sutures are present at the apex and there is not granulation tissue. No tenderness along the anterior or posterior vagina. No apical tenderness. No pelvic masses.    ---------------------------------------------------------  Assessment and Plan:  1. Post-operative state    - Can resume regular activity including exercise. Discussed avoidance of heavy lifting and straining long term to reduce the risk of recurrence. Ok for intercourse as well - continue vaginal estrogen cream twice a week  Follow up as needed  [name], MD

## 2024-04-01 ENCOUNTER — Encounter: Payer: Self-pay | Admitting: Physician Assistant

## 2024-04-01 DIAGNOSIS — R600 Localized edema: Secondary | ICD-10-CM

## 2024-04-03 MED ORDER — FUROSEMIDE 20 MG PO TABS: 20.0000 mg | ORAL_TABLET | Freq: Every day | ORAL | 2 refills | Status: AC | PRN

## 2024-06-28 ENCOUNTER — Ambulatory Visit: Admitting: Physician Assistant
